# Patient Record
Sex: Male | Born: 1965
Health system: Southern US, Community
[De-identification: ages and names within clinical notes are randomized; demographics above are authoritative.]

## PROBLEM LIST (undated history)

## (undated) DIAGNOSIS — A419 Sepsis, unspecified organism: Secondary | ICD-10-CM

## (undated) DIAGNOSIS — K649 Unspecified hemorrhoids: Secondary | ICD-10-CM

## (undated) DIAGNOSIS — L270 Generalized skin eruption due to drugs and medicaments taken internally: Secondary | ICD-10-CM

## (undated) DIAGNOSIS — L03115 Cellulitis of right lower limb: Secondary | ICD-10-CM

## (undated) DIAGNOSIS — L52 Erythema nodosum: Secondary | ICD-10-CM

## (undated) DIAGNOSIS — K602 Anal fissure, unspecified: Secondary | ICD-10-CM

## (undated) DIAGNOSIS — D573 Sickle-cell trait: Secondary | ICD-10-CM

## (undated) DIAGNOSIS — R609 Edema, unspecified: Secondary | ICD-10-CM

## (undated) DIAGNOSIS — C44509 Unspecified malignant neoplasm of skin of other part of trunk: Secondary | ICD-10-CM

## (undated) DIAGNOSIS — Z86010 Personal history of colonic polyps: Secondary | ICD-10-CM

## (undated) DIAGNOSIS — L0390 Cellulitis, unspecified: Secondary | ICD-10-CM

## (undated) DIAGNOSIS — L959 Vasculitis limited to the skin, unspecified: Secondary | ICD-10-CM

## (undated) HISTORY — DX: Anal fissure, unspecified: K60.2

## (undated) HISTORY — DX: Generalized skin eruption due to drugs and medicaments taken internally: L27.0

## (undated) HISTORY — PX: HERNIA REPAIR: SHX51

## (undated) HISTORY — DX: Erythema nodosum: L52

## (undated) HISTORY — DX: Unspecified hemorrhoids: K64.9

## (undated) HISTORY — DX: Cellulitis, unspecified: L03.90

## (undated) HISTORY — PX: PENIS REVASCULARIZATION SURGERY: SHX740

## (undated) HISTORY — DX: Edema, unspecified: R60.9

## (undated) HISTORY — DX: Sickle-cell trait: D57.3

## (undated) HISTORY — DX: Vasculitis limited to the skin, unspecified: L95.9

---
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
## 2007-12-24 ENCOUNTER — Ambulatory Visit (HOSPITAL_BASED_OUTPATIENT_CLINIC_OR_DEPARTMENT_OTHER): Admission: RE | Admit: 2007-12-24 | Discharge: 2007-12-24 | Payer: Self-pay | Admitting: General Surgery

## 2007-12-24 HISTORY — PX: UMBILICAL HERNIA REPAIR: SHX196

## 2010-05-21 NOTE — Op Note (Signed)
[name], [name]                ACCOUNT NO.:  [id number]   MEDICAL RECORD NO.:  [id number]          PATIENT TYPE:  AMB   LOCATION:  DSC                          FACILITY:  [facility]   PHYSICIAN:  Angelia Mould. Derrell Lolling, M.D.DATE OF BIRTH:  07-05-1965   DATE OF PROCEDURE:  12/24/2007  DATE OF DISCHARGE:                               OPERATIVE REPORT   PREOPERATIVE DIAGNOSIS:  Umbilical hernia.   POSTOPERATIVE DIAGNOSIS:  Umbilical hernia.   OPERATION PERFORMED:  Repair of umbilical hernia with Proceed ventral  patch mesh.   SURGEON:  Angelia Mould. Derrell Lolling, MD   OPERATIVE INDICATIONS:  This is a 45 year old white man who has a 2-year  history of an umbilical hernia.  This has gotten larger and has become  more unsightly and is associated with some pain and progressive  enlargement.  He was evaluated in the office and found to have a  reducible umbilical hernia with moderate-size hernia sac about 3.5 cm in  size.  He is brought to the operating room electively.   OPERATIVE TECHNIQUE:  Following induction of a general LMA anesthetic,  the patient's abdomen was prepped and draped in a sterile fashion.  The  patient was identified as correct patient and correct procedure.  Intravenous antibiotics were given.  Marcaine 0.5% with epinephrine was  used as a local infiltration anesthetic.  A curved transverse incision  was made at the lower rim of the umbilicus.  Dissection was carried down  through the subcutaneous tissue down to the intra-abdominal wall fascia.  I dissected circumferentially around the umbilical hernia defect and  incised the tissues and dissected the hernia sac away from the  undersurface of the umbilical skin.  After dissecting this a little bit  further I found that I could easily reduce this back into the  preperitoneal space.  The defect was probably about 2 cm.  I chose to  repair this with a 4.3 cm diameter Proceed ventral patch.  This patch  was brought to the operative  field.  I placed 4 equidistant sutures of  the #1 Novafil to secure the mesh under the fascia.  These sutures were  placed at the 12 o'clock, 3 o'clock, 6 o'clock, and 9 o'clock positions.  We passed the suture through the fascia, then a mattress suture into the  edge of the mesh, and then back up through the fascia under direct  vision.  After all 4 sutures were placed, I inserted the mesh into the  preperitoneal space and pulled all the sutures tight and this provided  very good deployment of the mesh disk.  All the sutures were tied.  The  umbilical fascia was then closed with 3 interrupted sutures of #1  Novafil.  The wound was irrigated with saline.  The repair appeared  intact.  The umbilical skin was tacked back to the abdominal fascia with  a 3-0 Vicryl suture.  The subcutaneous tissue was closed with  interrupted sutures of 3-0 Vicryl.  The skin was closed with a running  subcuticular suture of 4-0 Monocryl and Dermabond.  Clean bandages were  placed, and the patient was taken recovery room in stable condition.   ESTIMATED BLOOD LOSS:  About 10 mL.   COMPLICATIONS:  None.   Sponge, needle, and instrument counts were correct.      Angelia Mould. Derrell Lolling, M.D.  Electronically Signed     HMI/MEDQ  D:  12/24/2007  T:  12/24/2007  Job:  [id number]   cc:   Gaspar Garbe, M.D.

## 2010-11-12 ENCOUNTER — Emergency Department (INDEPENDENT_AMBULATORY_CARE_PROVIDER_SITE_OTHER): Payer: Managed Care, Other (non HMO)

## 2010-11-12 ENCOUNTER — Other Ambulatory Visit: Payer: Self-pay

## 2010-11-12 ENCOUNTER — Emergency Department (HOSPITAL_BASED_OUTPATIENT_CLINIC_OR_DEPARTMENT_OTHER)
Admission: EM | Admit: 2010-11-12 | Discharge: 2010-11-12 | Disposition: A | Discharge status: Home / Self Care | Payer: Managed Care, Other (non HMO) | Attending: Emergency Medicine | Admitting: Emergency Medicine

## 2010-11-12 DIAGNOSIS — K5900 Constipation, unspecified: Secondary | ICD-10-CM | POA: Insufficient documentation

## 2010-11-12 DIAGNOSIS — R509 Fever, unspecified: Secondary | ICD-10-CM

## 2010-11-12 DIAGNOSIS — K824 Cholesterolosis of gallbladder: Secondary | ICD-10-CM

## 2010-11-12 DIAGNOSIS — K279 Peptic ulcer, site unspecified, unspecified as acute or chronic, without hemorrhage or perforation: Secondary | ICD-10-CM | POA: Insufficient documentation

## 2010-11-12 DIAGNOSIS — R109 Unspecified abdominal pain: Secondary | ICD-10-CM | POA: Insufficient documentation

## 2010-11-12 DIAGNOSIS — R1013 Epigastric pain: Secondary | ICD-10-CM

## 2010-11-12 LAB — COMPREHENSIVE METABOLIC PANEL
ALT: 21 U/L (ref 0–53)
Albumin: 4.3 g/dL (ref 3.5–5.2)
Alkaline Phosphatase: 67 U/L (ref 39–117)
BUN: 10 mg/dL (ref 6–23)
Chloride: 102 mEq/L (ref 96–112)
Glucose, Bld: 128 mg/dL — ABNORMAL HIGH (ref 70–99)
Potassium: 4.3 mEq/L (ref 3.5–5.1)
Total Bilirubin: 0.6 mg/dL (ref 0.3–1.2)

## 2010-11-12 LAB — URINALYSIS, ROUTINE W REFLEX MICROSCOPIC
Bilirubin Urine: NEGATIVE
Glucose, UA: NEGATIVE mg/dL
Hgb urine dipstick: NEGATIVE
Ketones, ur: NEGATIVE mg/dL
Protein, ur: NEGATIVE mg/dL
Urobilinogen, UA: 0.2 mg/dL (ref 0.0–1.0)

## 2010-11-12 LAB — CBC
HCT: 42 % (ref 39.0–52.0)
MCH: 27.9 pg (ref 26.0–34.0)
MCHC: 34.8 g/dL (ref 30.0–36.0)
MCV: 80.3 fL (ref 78.0–100.0)
Platelets: 205 10*3/uL (ref 150–400)
RDW: 13.3 % (ref 11.5–15.5)
WBC: 11.9 10*3/uL — ABNORMAL HIGH (ref 4.0–10.5)

## 2010-11-12 LAB — LIPASE, BLOOD: Lipase: 24 U/L (ref 11–59)

## 2010-11-12 LAB — DIFFERENTIAL
Basophils Relative: 0 % (ref 0–1)
Lymphocytes Relative: 8 % — ABNORMAL LOW (ref 12–46)
Lymphs Abs: 0.9 10*3/uL (ref 0.7–4.0)
Monocytes Relative: 9 % (ref 3–12)

## 2010-11-12 MED ORDER — PANTOPRAZOLE SODIUM 20 MG PO TBEC: 40.0000 mg | DELAYED_RELEASE_TABLET | Freq: Every day | ORAL | Status: DC

## 2010-11-12 MED ORDER — KETOROLAC TROMETHAMINE 30 MG/ML IJ SOLN
30.0000 mg | Freq: Once | INTRAMUSCULAR | Status: AC
Start: 2010-11-12 — End: 2010-11-12
  Administered 2010-11-12: 30 mg via INTRAVENOUS

## 2010-11-12 MED ORDER — KETOROLAC TROMETHAMINE 30 MG/ML IJ SOLN
INTRAMUSCULAR | Status: AC
  Administered 2010-11-12: 30 mg
  Filled 2010-11-12: qty 1

## 2010-11-12 MED ORDER — IOHEXOL 300 MG/ML  SOLN: 100.0000 mL | Freq: Once | INTRAMUSCULAR | Status: DC | PRN

## 2010-11-12 NOTE — ED Notes (Signed)
Pt reports abdominal pain that started Sunday.  Pain is described as intermittent and a dull ache.

## 2010-11-12 NOTE — ED Notes (Signed)
Pt returned from ultrasound.  Pharmacy Tech at bedside for medication reconciliation.

## 2010-11-12 NOTE — ED Notes (Signed)
Pt ambulatory to ultrasound.

## 2010-11-12 NOTE — ED Notes (Signed)
Pt reports epigastric pain described as "discomfort" and asking for pain medication.  Dr. Ignacia Palma informed.

## 2010-11-12 NOTE — ED Notes (Signed)
Pt returned from CT °

## 2010-11-12 NOTE — ED Notes (Signed)
Pt transported to CT ?

## 2010-11-12 NOTE — ED Provider Notes (Signed)
History     CSN: [id number] Arrival date & time: 11/12/2010  8:28 AM   None     Chief Complaint  Patient presents with  . Abdominal Pain    (Consider location/radiation/quality/duration/timing/severity/associated sxs/prior treatment) HPI Comments: Patient is a 45 year old man who says he has had a dull ache in his upper abdomen intermittently since Sunday evening, 2 days ago. He says he doesn't usually get indigestion or heartburn. Others he rates the same food at [employer] did not get sick. He had eaten some food yesterday when he had pain in the pain seemed to subside briefly but returned yesterday evening. He therefore sought evaluation. He has had prior surgery for umbilical hernia, for fracture of his penis.  He notes that he works for [employer], where he is exposed to lead.  He requests testing of his lead level.    Patient is a 45 y.o. male presenting with abdominal pain. The history is provided by the patient.  Abdominal Pain The primary symptoms of the illness include abdominal pain. The primary symptoms of the illness do not include nausea, vomiting, diarrhea or dysuria. The current episode started 2 days ago. The onset of the illness was gradual. The problem has not changed since onset. The patient has had a change in bowel habit (He has noted some hard bowel movements lately.). Additional symptoms associated with the illness include constipation. Symptoms associated with the illness do not include chills, anorexia, heartburn, urgency, hematuria, frequency or back pain.    History reviewed. No pertinent past medical history.  Past Surgical History  Procedure Date  . Penis revascularization surgery     History reviewed. No pertinent family history.  History  Substance Use Topics  . Smoking status: Never Smoker   . Smokeless tobacco: Never Used  . Alcohol Use: Yes     occasional      Review of Systems  Constitutional: Negative.  Negative for chills and appetite  change.  HENT: Negative.   Eyes: Negative.   Respiratory: Negative.   Cardiovascular: Negative.  Negative for chest pain.  Gastrointestinal: Positive for abdominal pain and constipation. Negative for heartburn, nausea, vomiting, diarrhea and anorexia.  Genitourinary: Negative for dysuria, urgency, frequency and hematuria.  Musculoskeletal: Negative for back pain.  Skin: Negative.   Neurological: Negative.  Negative for dizziness, syncope and weakness.  Psychiatric/Behavioral: Negative.     Allergies  Review of patient's allergies indicates no known allergies.  Home Medications   Current Outpatient Rx  Name Route Sig Dispense Refill  . ASPIRIN 325 MG PO TABS Oral Take 325 mg by mouth daily. For pain stomach     . IBUPROFEN 200 MG PO TABS Oral Take 600 mg by mouth every 6 (six) hours as needed. Soreness and pain     . PANTOPRAZOLE SODIUM 20 MG PO TBEC Oral Take 2 tablets (40 mg total) by mouth daily. 30 tablet 0    BP 138/85  Pulse 58  Temp(Src) 99.1 F (37.3 C) (Oral)  Resp 16  Ht 6' (1.829 m)  Wt 239 lb (108.41 kg)  BMI 32.41 kg/m2  SpO2 96%  Physical Exam  Nursing note and vitals reviewed. Constitutional: He is oriented to person, place, and time. He appears well-developed and well-nourished. No distress.  HENT:  Head: Normocephalic and atraumatic.  Right Ear: External ear normal.  Left Ear: External ear normal.  Mouth/Throat: Oropharynx is clear and moist.  Eyes: Conjunctivae and EOM are normal. Pupils are equal, round, and reactive to light.  No scleral icterus.  Neck: Normal range of motion. Neck supple. No thyromegaly present.  Cardiovascular: Normal rate, regular rhythm and normal heart sounds.   Pulmonary/Chest: Effort normal and breath sounds normal.  Abdominal: Soft. Bowel sounds are normal.       He localizes his pain to the epigastric region. There is no palpable deformity. There is no point tenderness, mass, rebound or rigidity.  Musculoskeletal: Normal  range of motion.  Lymphadenopathy:    He has no cervical adenopathy.  Neurological: He is alert and oriented to person, place, and time.       No sensory or motor deficits.  Skin: Skin is warm and dry.  Psychiatric: He has a normal mood and affect. His behavior is normal.    ED Course  Procedures (including critical care time)  Labs Reviewed  CBC - Abnormal; Notable for the following:    WBC 11.9 (*)    All other components within normal limits  DIFFERENTIAL - Abnormal; Notable for the following:    Neutrophils Relative 83 (*)    Neutro Abs 9.8 (*)    Lymphocytes Relative 8 (*)    All other components within normal limits  COMPREHENSIVE METABOLIC PANEL - Abnormal; Notable for the following:    Glucose, Bld 128 (*)    All other components within normal limits  URINALYSIS, ROUTINE W REFLEX MICROSCOPIC  LIPASE, BLOOD  TROPONIN I  LEAD, BLOOD   12:30 PM Patient was seen and had physical examination. Laboratory tests and abdominal ultrasound were ordered. A lead level was ordered, and I advised the patient that this would be a test that  would take several days to have a result.   Date: 11/12/2010  Rate:70  Rhythm: normal sinus rhythm  QRS Axis: normal  Intervals: normal  ST/T Wave abnormalities: normal  Conduction Disutrbances:none  Narrative Interpretation: Normal EKG  Old EKG Reviewed: none available  12:30 PM Patient complains of pain. He is driving, so Toradol 30 IV was ordered.  12:30 PM Patient's white blood count was 11,800 with 83% neutrophils, mildly elevated. His ultrasound of the abdomen showed no gallstones. CT of the abdomen and pelvis with oral and IV contrast was ordered. He is currently resting comfortably.  12:30 PM CT of abdomen/pelvis shows thickening of gastric antrum, no perforation.  Will treat for peptic ulcer disease, with Protonix and antacids pc and hs.  He should have followup with a gastroenterologist.  Referred to Dr. Stan Head of [facility].   1. Peptic ulcer disease           Carleene Cooper III, MD 11/12/10 1230

## 2010-12-03 ENCOUNTER — Encounter: Payer: Self-pay | Admitting: Internal Medicine

## 2010-12-23 ENCOUNTER — Encounter: Payer: Self-pay | Admitting: Internal Medicine

## 2010-12-23 ENCOUNTER — Ambulatory Visit (INDEPENDENT_AMBULATORY_CARE_PROVIDER_SITE_OTHER): Payer: Managed Care, Other (non HMO) | Admitting: Internal Medicine

## 2010-12-23 VITALS — BP 102/72 | HR 66 | Ht 72.0 in | Wt 237.0 lb

## 2010-12-23 DIAGNOSIS — R933 Abnormal findings on diagnostic imaging of other parts of digestive tract: Secondary | ICD-10-CM

## 2010-12-23 DIAGNOSIS — R1013 Epigastric pain: Secondary | ICD-10-CM

## 2010-12-23 NOTE — Progress Notes (Signed)
  Subjective:    Patient ID: Benjamin Mcdowell, male    DOB: [birth year], 45 y.o.   MRN: [id number]  HPI this 45 year old white man developed epigastric pain about 6 weeks ago. It was fairly intense. He went to the emergency department where he was evaluated with an ultrasound and a CT scan, I reviewed those reports. The main finding on those was that he had some thickening of the gastric antrum of unclear etiology. He took Protonix 40 mg daily for one month. He now feels well with the exception of some occasional hunger pains. He does not use anti-inflammatories on a regular basis though uses some Advil from time to time. He was drinking heavily on the weekends during [employer] football season. He uses a lot of diet caffeinated sodas as well. He's had no unintentional weight loss or bleeding. His CBC and chemistries were normal. He did request a lead level which was in the high normal range for adult, he works at [employer] he is taking steps to reduce his levels and is having followup at work.  His GI review of systems is otherwise negative.   Review of Systems All other review of systems negative except as mentioned above.    Objective:   Physical Exam General:  Well-developed, well-nourished and in no acute distress - overweight Eyes:  anicteric. ENT:   Mouth and posterior pharynx free of lesions.  Neck:   supple w/o thyromegaly or mass.  Lungs: Clear to auscultation bilaterally. Heart:  S1S2, no rubs, murmurs, gallops. Abdomen:  soft, non-tender, no hepatosplenomegaly, hernia, or mass and BS+.  Lymph:  no cervical or supraclavicular adenopathy. Neuro:  A&O x 3.  Psych:  appropriate mood and  Affect.   Data Reviewed: Labs, CT scan report, ultrasound report, ER note from November 6.        Assessment & Plan:  Epigastric pain, thickening of the gastric antrum on CT the abdomen. His symptoms have essentially resolved after one month of PPI.  Cause of all this not clear. I think  GERD, gastritis, peptic ulcer disease and even upper GI tract malignancy are possible. We have discussed these possibilities. He understands that without upper endoscopy testing we cannot have a better understanding of the cause of the thickened antrum on CT. He really had not been sick for very long and he feels well now. So the overall likelihood of a gastrointestinal malignancy is low it seems. However, again I made clear that stomach cancer is a possibility here.  We discussed the options of upper endoscopy versus observation and some diet modification. Since he has no worrisome features other than a radiographic abnormality, he has decided to opt for the observation mode in follow up as needed. He is advised to lose weight as well as obtain a primary care physician.

## 2010-12-23 NOTE — Patient Instructions (Signed)
We really do not know what the thickening of your stomach wall on the CT scan was. As discussed it is probably not anything bad but it could be. You have chosen not to pursue upper endoscopy for evaluation. If you develop recurrent abdominal pain or similar symptoms please call back and we can arrange further followup or upper endoscopy testing. You have been given some GERD diet instructions that may be useful in minimizing symptoms, if he get them. Losing 10 pounds in the next year would be helpful to you overall health. I recommend to obtain a primary care physician for continuity of care as well. Iva Boop, MD, Clement[facility] Graham

## 2013-01-04 ENCOUNTER — Emergency Department (HOSPITAL_BASED_OUTPATIENT_CLINIC_OR_DEPARTMENT_OTHER)
Admission: EM | Admit: 2013-01-04 | Discharge: 2013-01-04 | Disposition: A | Discharge status: Home / Self Care | Payer: Managed Care, Other (non HMO) | Attending: Emergency Medicine | Admitting: Emergency Medicine

## 2013-01-04 ENCOUNTER — Encounter (HOSPITAL_BASED_OUTPATIENT_CLINIC_OR_DEPARTMENT_OTHER): Payer: Self-pay | Admitting: Emergency Medicine

## 2013-01-04 DIAGNOSIS — L02619 Cutaneous abscess of unspecified foot: Secondary | ICD-10-CM | POA: Insufficient documentation

## 2013-01-04 DIAGNOSIS — Z792 Long term (current) use of antibiotics: Secondary | ICD-10-CM | POA: Insufficient documentation

## 2013-01-04 DIAGNOSIS — Z8719 Personal history of other diseases of the digestive system: Secondary | ICD-10-CM | POA: Insufficient documentation

## 2013-01-04 DIAGNOSIS — R509 Fever, unspecified: Secondary | ICD-10-CM | POA: Insufficient documentation

## 2013-01-04 DIAGNOSIS — L03119 Cellulitis of unspecified part of limb: Secondary | ICD-10-CM

## 2013-01-04 LAB — BASIC METABOLIC PANEL
BUN: 13 mg/dL (ref 6–23)
CO2: 28 mEq/L (ref 19–32)
Chloride: 100 mEq/L (ref 96–112)
Creatinine, Ser: 1.1 mg/dL (ref 0.50–1.35)
Glucose, Bld: 96 mg/dL (ref 70–99)

## 2013-01-04 LAB — CBC
HCT: 41.3 % (ref 39.0–52.0)
Hemoglobin: 14.2 g/dL (ref 13.0–17.0)
MCV: 83.4 fL (ref 78.0–100.0)
RDW: 13.3 % (ref 11.5–15.5)
WBC: 12.7 10*3/uL — ABNORMAL HIGH (ref 4.0–10.5)

## 2013-01-04 MED ORDER — CLINDAMYCIN HCL 150 MG PO CAPS: 300.0000 mg | ORAL_CAPSULE | Freq: Three times a day (TID) | ORAL | Status: DC

## 2013-01-04 MED ORDER — CLINDAMYCIN PHOSPHATE 600 MG/50ML IV SOLN
600.0000 mg | Freq: Once | INTRAVENOUS | Status: AC
Start: 2013-01-04 — End: 2013-01-04
  Administered 2013-01-04: 600 mg via INTRAVENOUS
  Filled 2013-01-04: qty 50

## 2013-01-04 NOTE — ED Notes (Signed)
Swollen, reddness in right ankle, foot. Cellulitis in that foot before

## 2013-01-04 NOTE — ED Provider Notes (Signed)
CSN: [id number]     Arrival date & time 01/04/13  1441 History   First MD Initiated Contact with Patient 01/04/13 1507     Chief Complaint  Patient presents with  . Foot Pain   (Consider location/radiation/quality/duration/timing/severity/associated sxs/prior Treatment) HPI Pt presenting with c/o pain in right foot.  Associated with redness and swelling.  Pt has had hx of prior cellulitis of this foot, last episode was several months ago after insect bite.  No known trauma or skin breakdown of foot this time.  Had subjective fever, diffuse body aches yesterday, associated with beginning of redness of foot.  Today redness is increasing to involve his lower leg.  Swelling of right lower extremity and foot compared to left side is chronic since age 4.  There are no other associated systemic symptoms, there are no other alleviating or modifying factors.   Past Medical History  Diagnosis Date  . Anal fissure    Past Surgical History  Procedure Laterality Date  . Penis revascularization surgery    . Umbilical hernia repair  12/24/2007    with Proceed ventral   Family History  Problem Relation Age of Onset  . Prostate cancer Father   . Heart disease      GRANDFATHER   History  Substance Use Topics  . Smoking status: Never Smoker   . Smokeless tobacco: Never Used  . Alcohol Use: Yes     Comment: occasional    Review of Systems ROS reviewed and all otherwise negative except for mentioned in HPI  Allergies  Review of patient's allergies indicates no known allergies.  Home Medications   Current Outpatient Rx  Name  Route  Sig  Dispense  Refill  . clindamycin (CLEOCIN) 150 MG capsule   Oral   Take 2 capsules (300 mg total) by mouth 3 (three) times daily.   30 capsule   0   . ibuprofen (ADVIL,MOTRIN) 200 MG tablet   Oral   Take 600 mg by mouth every 6 (six) hours as needed. Soreness and pain           BP 128/88  Pulse 103  Temp(Src) 98.3 F (36.8 C) (Oral)  Resp 18   Ht 6' (1.829 m)  Wt 255 lb (115.667 kg)  BMI 34.58 kg/m2  SpO2 99% Vitals reviewed Physical Exam Physical Examination: General appearance - alert, well appearing, and in no distress Mental status - alert, oriented to person, place, and time Mouth - mucous membranes moist, pharynx normal without lesions Chest - clear to auscultation, no wheezes, rales or rhonchi, symmetric air entry Heart - normal rate, regular rhythm, normal S1, S2, no murmurs, rubs, clicks or gallops Neurological - alert, oriented x 3, sensation distally intact Musculoskeletal - no joint tenderness, deformity or swelling Extremities - peripheral pulses normal, no pedal edema, no clubbing or cyanosis Skin - skin overlying dorsum of right foot erythematous extending into anterior tibial region, no ulcerations, no blisters  ED Course  Procedures (including critical care time) Labs Review Labs Reviewed  CBC - Abnormal; Notable for the following:    WBC 12.7 (*)    All other components within normal limits  BASIC METABOLIC PANEL - Abnormal; Notable for the following:    GFR calc non Af Amer 78 (*)    All other components within normal limits   Imaging Review No results found.  EKG Interpretation   None       MDM   1. Cellulitis of foot    Pt  presenting with c/o redness in right foot, chronic swelling, subjective fever- has hx of cellulitis in this foot.  Mild leukocytosis, started on IV clindamycin- has had doxycycline in the past but this did not help his symptoms during a previous episode.  Discharged with strict return precautions.  Pt agreeable with plan.    Ethelda Chick, MD 01/04/13 [phone]

## 2013-01-05 ENCOUNTER — Inpatient Hospital Stay (HOSPITAL_BASED_OUTPATIENT_CLINIC_OR_DEPARTMENT_OTHER)
Admission: EM | Admit: 2013-01-05 | Discharge: 2013-01-07 | DRG: 603 | Disposition: A | Discharge status: Home / Self Care | Payer: Managed Care, Other (non HMO) | Attending: Internal Medicine | Admitting: Internal Medicine

## 2013-01-05 ENCOUNTER — Encounter (HOSPITAL_BASED_OUTPATIENT_CLINIC_OR_DEPARTMENT_OTHER): Payer: Self-pay | Admitting: Emergency Medicine

## 2013-01-05 ENCOUNTER — Emergency Department (HOSPITAL_BASED_OUTPATIENT_CLINIC_OR_DEPARTMENT_OTHER): Payer: Managed Care, Other (non HMO)

## 2013-01-05 DIAGNOSIS — I890 Lymphedema, not elsewhere classified: Secondary | ICD-10-CM | POA: Diagnosis present

## 2013-01-05 DIAGNOSIS — E781 Pure hyperglyceridemia: Secondary | ICD-10-CM | POA: Diagnosis present

## 2013-01-05 DIAGNOSIS — L02619 Cutaneous abscess of unspecified foot: Principal | ICD-10-CM | POA: Diagnosis present

## 2013-01-05 DIAGNOSIS — D692 Other nonthrombocytopenic purpura: Secondary | ICD-10-CM | POA: Diagnosis present

## 2013-01-05 DIAGNOSIS — Z8042 Family history of malignant neoplasm of prostate: Secondary | ICD-10-CM

## 2013-01-05 DIAGNOSIS — L03115 Cellulitis of right lower limb: Secondary | ICD-10-CM

## 2013-01-05 LAB — CBC WITH DIFFERENTIAL/PLATELET
Basophils Absolute: 0 10*3/uL (ref 0.0–0.1)
Basophils Relative: 0 % (ref 0–1)
Eosinophils Relative: 2 % (ref 0–5)
HCT: 39.5 % (ref 39.0–52.0)
Hemoglobin: 13.7 g/dL (ref 13.0–17.0)
MCH: 28.8 pg (ref 26.0–34.0)
MCHC: 34.7 g/dL (ref 30.0–36.0)
MCV: 83.2 fL (ref 78.0–100.0)
Monocytes Absolute: 0.9 10*3/uL (ref 0.1–1.0)
Monocytes Relative: 10 % (ref 3–12)
RDW: 13.1 % (ref 11.5–15.5)

## 2013-01-05 LAB — COMPREHENSIVE METABOLIC PANEL
AST: 30 U/L (ref 0–37)
Albumin: 3.7 g/dL (ref 3.5–5.2)
BUN: 13 mg/dL (ref 6–23)
Calcium: 9.1 mg/dL (ref 8.4–10.5)
Creatinine, Ser: 1.1 mg/dL (ref 0.50–1.35)
GFR calc non Af Amer: 78 mL/min — ABNORMAL LOW (ref >90)
Total Bilirubin: 0.7 mg/dL (ref 0.3–1.2)

## 2013-01-05 MED ORDER — CEFTRIAXONE SODIUM 1 G IJ SOLR
INTRAMUSCULAR | Status: AC
  Administered 2013-01-05: 1000 mg
  Filled 2013-01-05: qty 10

## 2013-01-05 MED ORDER — DEXTROSE 5 % IV SOLN
1.0000 g | INTRAVENOUS | Status: DC
  Administered 2013-01-06: 1 g via INTRAVENOUS
  Filled 2013-01-05 (×2): qty 10

## 2013-01-05 NOTE — ED Notes (Signed)
Pt returned from radiology.

## 2013-01-05 NOTE — ED Provider Notes (Signed)
History/physical exam/procedure(s) were performed by non-physician practitioner and as supervising physician I was immediately available for consultation/collaboration. I have reviewed all notes and am in agreement with care and plan.   Hilario Quarry, MD 01/05/13 [phone]

## 2013-01-05 NOTE — ED Provider Notes (Signed)
CSN: [id number]     Arrival date & time 01/05/13  1518 History   First MD Initiated Contact with Patient 01/05/13 1555     Chief Complaint  Patient presents with  . Wound Check   (Consider location/radiation/quality/duration/timing/severity/associated sxs/prior Treatment) Patient is a 47 y.o. male presenting with foot injury. The history is provided by the patient. No language interpreter was used.  Foot Injury Location:  Foot Time since incident:  3 days Foot location:  R foot Pain details:    Quality:  Aching and burning   Radiates to:  Does not radiate   Severity:  Moderate   Onset quality:  Gradual   Timing:  Constant   Progression:  Worsening Chronicity:  New Dislocation: no   Foreign body present:  No foreign bodies Tetanus status:  Up to date Relieved by:  Nothing Worsened by:  Nothing tried Ineffective treatments:  None tried Associated symptoms: stiffness   Risk factors: no concern for non-accidental trauma     Past Medical History  Diagnosis Date  . Anal fissure    Past Surgical History  Procedure Laterality Date  . Penis revascularization surgery    . Umbilical hernia repair  12/24/2007    with Proceed ventral   Family History  Problem Relation Age of Onset  . Prostate cancer Father   . Heart disease      GRANDFATHER   History  Substance Use Topics  . Smoking status: Never Smoker   . Smokeless tobacco: Never Used  . Alcohol Use: Yes     Comment: occasional    Review of Systems  Musculoskeletal: Positive for joint swelling and stiffness.  Skin: Positive for color change.  All other systems reviewed and are negative.    Allergies  Review of patient's allergies indicates no known allergies.  Home Medications   Current Outpatient Rx  Name  Route  Sig  Dispense  Refill  . clindamycin (CLEOCIN) 150 MG capsule   Oral   Take 2 capsules (300 mg total) by mouth 3 (three) times daily.   30 capsule   0   . ibuprofen (ADVIL,MOTRIN) 200 MG  tablet   Oral   Take 600 mg by mouth every 6 (six) hours as needed. Soreness and pain           BP 132/91  Pulse 87  Temp(Src) 98.1 F (36.7 C) (Oral)  Resp 16  SpO2 98% Physical Exam  Nursing note and vitals reviewed. Constitutional: He is oriented to person, place, and time. He appears well-developed and well-nourished.  HENT:  Head: Normocephalic.  Eyes: Pupils are equal, round, and reactive to light.  Neck: Normal range of motion.  Cardiovascular: Normal rate.   Pulmonary/Chest: Effort normal.  Abdominal: Soft.  Musculoskeletal: He exhibits tenderness.  Neurological: He is alert and oriented to person, place, and time. He has normal reflexes.  Skin: There is erythema.  Psychiatric: He has a normal mood and affect.    ED Course  Procedures (including critical care time) Labs Review Labs Reviewed  COMPREHENSIVE METABOLIC PANEL - Abnormal; Notable for the following:    Glucose, Bld 115 (*)    ALT 66 (*)    GFR calc non Af Amer 78 (*)    All other components within normal limits  CBC WITH DIFFERENTIAL   Imaging Review No results found.  EKG Interpretation   None     Pt was seen here yesterday and started on clindamycin.   Pt reports he has had cellulitis  in the same foot in the past.  Pt reports he has responded well to rocephin with previous infections.     MDM   1. Cellulitis of right foot     I discussed with Dr. Allena Katz.   Pt to be admitted to med surg.  Pt refuses to go by [facility].   Pt request to drive himself.    Dr. Rosalia Hammers spoke to hospitalist and pt can not be admitted to floor if he drives.   Pt very upset.  I spoke to Dr. Patria Mane who will see at [facility] ED and contact hospitalist for admission.   Elson Areas, PA-C 01/05/13 1918  Lonia Skinner [place], [place] 01/05/13 2236

## 2013-01-05 NOTE — ED Notes (Signed)
Pts transferred to the [facility] via POV.  pts IV secured for transport.  Mayra Reel, RN at [facility] is aware that pt is coming.

## 2013-01-05 NOTE — ED Notes (Signed)
Patient transported to X-ray 

## 2013-01-05 NOTE — ED Notes (Signed)
Margins marked on right lower extremity.

## 2013-01-05 NOTE — ED Notes (Signed)
Pt seen here yesterday back today for wd recheck right foot cellulitis

## 2013-01-06 DIAGNOSIS — L03119 Cellulitis of unspecified part of limb: Principal | ICD-10-CM

## 2013-01-06 DIAGNOSIS — E781 Pure hyperglyceridemia: Secondary | ICD-10-CM | POA: Diagnosis present

## 2013-01-06 DIAGNOSIS — M79609 Pain in unspecified limb: Secondary | ICD-10-CM

## 2013-01-06 DIAGNOSIS — L02619 Cutaneous abscess of unspecified foot: Principal | ICD-10-CM

## 2013-01-06 DIAGNOSIS — I890 Lymphedema, not elsewhere classified: Secondary | ICD-10-CM | POA: Diagnosis present

## 2013-01-06 LAB — CBC
HEMATOCRIT: 37.1 % — AB (ref 39.0–52.0)
Hemoglobin: 8 g/dL — ABNORMAL LOW (ref 13.0–17.0)
MCH: 18.3 pg — ABNORMAL LOW (ref 26.0–34.0)
MCHC: 21.6 g/dL — AB (ref 30.0–36.0)
MCV: 84.7 fL (ref 78.0–100.0)
PLATELETS: 163 10*3/uL (ref 150–400)
RBC: 4.38 MIL/uL (ref 4.22–5.81)
RDW: 13.2 % (ref 11.5–15.5)
WBC: 7.3 10*3/uL (ref 4.0–10.5)

## 2013-01-06 LAB — COMPREHENSIVE METABOLIC PANEL
ALK PHOS: 57 U/L (ref 39–117)
ALT: 65 U/L — AB (ref 0–53)
AST: 35 U/L (ref 0–37)
Albumin: 3.3 g/dL — ABNORMAL LOW (ref 3.5–5.2)
BILIRUBIN TOTAL: 0.4 mg/dL (ref 0.3–1.2)
BUN: 12 mg/dL (ref 6–23)
CO2: 25 mEq/L (ref 19–32)
Calcium: 8.7 mg/dL (ref 8.4–10.5)
Chloride: 103 mEq/L (ref 96–112)
Creatinine, Ser: 0.95 mg/dL (ref 0.50–1.35)
GFR calc non Af Amer: >90 mL/min (ref >90)
GLUCOSE: 130 mg/dL — AB (ref 70–99)
POTASSIUM: 4.1 meq/L (ref 3.7–5.3)
Sodium: 142 mEq/L (ref 137–147)
Total Protein: 6.5 g/dL (ref 6.0–8.3)

## 2013-01-06 LAB — PROTIME-INR
INR: 0.99 (ref 0.00–1.49)
Prothrombin Time: 12.9 seconds (ref 11.6–15.2)

## 2013-01-06 LAB — HEMOGLOBIN AND HEMATOCRIT, BLOOD
HCT: 38.2 % — ABNORMAL LOW (ref 39.0–52.0)
Hemoglobin: 13.3 g/dL (ref 13.0–17.0)

## 2013-01-06 MED ORDER — VANCOMYCIN HCL IN DEXTROSE 1-5 GM/200ML-% IV SOLN
1000.0000 mg | Freq: Three times a day (TID) | INTRAVENOUS | Status: DC
  Administered 2013-01-06 – 2013-01-07 (×5): 1000 mg via INTRAVENOUS
  Filled 2013-01-06 (×7): qty 200

## 2013-01-06 MED ORDER — ACETAMINOPHEN 650 MG RE SUPP: 650.0000 mg | Freq: Four times a day (QID) | RECTAL | Status: DC | PRN

## 2013-01-06 MED ORDER — SODIUM CHLORIDE 0.9 % IV SOLN: INTRAVENOUS | Status: AC

## 2013-01-06 MED ORDER — ENOXAPARIN SODIUM 40 MG/0.4ML ~~LOC~~ SOLN
40.0000 mg | Freq: Every day | SUBCUTANEOUS | Status: DC
  Administered 2013-01-06 – 2013-01-07 (×2): 40 mg via SUBCUTANEOUS
  Filled 2013-01-06 (×2): qty 0.4

## 2013-01-06 MED ORDER — VANCOMYCIN HCL IN DEXTROSE 1-5 GM/200ML-% IV SOLN
1000.0000 mg | Freq: Once | INTRAVENOUS | Status: AC
  Administered 2013-01-06: 1000 mg via INTRAVENOUS

## 2013-01-06 MED ORDER — ACETAMINOPHEN 325 MG PO TABS: 650.0000 mg | ORAL_TABLET | Freq: Four times a day (QID) | ORAL | Status: DC | PRN

## 2013-01-06 NOTE — ED Notes (Signed)
This RN was told that because the pt was transferred from [facility] by POV instead of by ambulance, pt needs to be worked up again. Venora Maples, MD is aware.

## 2013-01-06 NOTE — Progress Notes (Signed)
*  PRELIMINARY RESULTS* Vascular Ultrasound Right lower extremity venous duplex has been completed.  Preliminary findings: no evidence of DVT.    Landry Mellow, RDMS, RVT  01/06/2013, 2:19 PM

## 2013-01-06 NOTE — Progress Notes (Signed)
ANTIBIOTIC CONSULT NOTE - INITIAL  Pharmacy Consult for Vancomycin Indication: cellulitis  No Known Allergies  Patient Measurements: Height: 6' 0.05" (183 cm) Weight: 255 lb 11.7 oz (116 kg) IBW/kg (Calculated) : 77.71 Adjusted Body Weight: 95 kg  Vital Signs: Temp: 98.4 F (36.9 C) (01/01 0147) Temp src: Oral (01/01 0147) BP: 142/89 mmHg (01/01 0147) Pulse Rate: 89 (01/01 0147) Intake/Output from previous day:   Intake/Output from this shift:    Labs:  Recent Labs  01/04/13 1530 01/05/13 1650  WBC 12.7* 8.9  HGB 14.2 13.7  PLT 160 176  CREATININE 1.10 1.10   Estimated Creatinine Clearance: 109.2 ml/min (by C-G formula based on Cr of 1.1). No results found for this basename: VANCOTROUGH, VANCOPEAK, VANCORANDOM, GENTTROUGH, GENTPEAK, GENTRANDOM, TOBRATROUGH, TOBRAPEAK, TOBRARND, AMIKACINPEAK, AMIKACINTROU, AMIKACIN,  in the last 72 hours   Microbiology: No results found for this or any previous visit (from the past 720 hour(s)).  Medical History: Past Medical History  Diagnosis Date  . Anal fissure     Medications:  Clindamycin  Ibuprofen  Assessment: 48 yo male with RLE cellulitis for empiric antibiotics  Goal of Therapy:  Vancomycin trough level 10-15 mcg/ml  Plan:  Vancomycin 1 g IV now, then 1 g IV q8h starting this morning at 0600  Gaetana Kawahara, [place]onson Curb 01/06/2013,1:54 AM

## 2013-01-06 NOTE — ED Provider Notes (Signed)
12:28 AM Pt with worsening RLE cellulitis despite antibiotics as an outpatient.  He now has erythema of his right medial thigh.  Afebrile.  Patient given a dose of Rocephin at the outside emergency department.  Came to this emergency department private vehicle for which I accepted.  I think the patient will benefit from admission to the hospital for ongoing IV antibiotics.  He may also benefit from right lower extremity duplex.  [place], MD 01/06/13 [phone]

## 2013-01-06 NOTE — ED Notes (Signed)
[place], MD at bedside.  

## 2013-01-06 NOTE — Progress Notes (Signed)
[facility] PROGRESS NOTE  Benjamin Mcdowell DJS:[id number] DOB: 1965/11/26 DOA: 01-26-13 PCP: No PCP Per Patient  Assessment/Plan:  Right leg cellulitis with purpura 1st episode when patient was 15.  This is 6th episode.  Received 1 day of clinda outpatient Started on vanc and rocephin at admission. May be able to narrow to vanc. Ordered RLE doppler u/s to rule out DVT.  High triglycerides 270s  Noted on work physical 12/15 Patient needs PCP to follow this outpatient.    DVT Prophylaxis:  lovenox  Code Status: full Family Communication: patient A&O Disposition Plan: inpatient.      Antibiotics:  vanc  HPI/Subjective: Redness and swelling started 3 days ago.  Patient is concern about swelling/redness in his groin.  Afraid cellulitis is seeding to his groin.  6th episode of cellulitis. Never had purpura before.  Objective: Filed Vitals:   01/26/2013 2342 01/06/13 0147 01/06/13 0239 01/06/13 0546  BP: 148/89 142/89 137/89 131/79  Pulse: 95 89 81 82  Temp:  98.4 F (36.9 C) 98.5 F (36.9 C) 98.4 F (36.9 C)  TempSrc:  Oral Oral Oral  Resp: 16  19 20   Height:  6' 0.05" (1.83 m)    Weight:  116 kg (255 lb 11.7 oz)    SpO2: 96% 99% 96% 100%    Intake/Output Summary (Last 24 hours) at 01/06/13 1116 Last data filed at 01/06/13 1017  Gross per 24 hour  Intake    610 ml  Output    500 ml  Net    110 ml   Filed Weights   01/06/13 0147  Weight: 116 kg (255 lb 11.7 oz)    Exam: General: Well developed, well nourished, NAD, appears stated age  48:  PERR, EOMI, Anicteic Sclera, MMM. No pharyngeal erythema or exudates  Neck: Supple, no JVD, no masses  Cardiovascular: RRR, S1 S2 auscultated, no rubs, murmurs or gallops.   Respiratory: Clear to auscultation bilaterally with equal chest rise  Abdomen: Soft, nontender, nondistended, + bowel sounds.   GU:  No lymphadenopathy or swelling noted in right groin. Extremities: right lower extremity with pink swelling  from mid tibia thru foot/toes.  approx 15 raised annular purpura.  Good rom.  Neuro: AAOx3, cranial nerves grossly intact. Strength 5/5 in upper and lower extremities  Psych: Normal affect and demeanor with intact judgement and insight       Data Reviewed: Basic Metabolic Panel:  Recent Labs Lab 01/04/13 1530 Jan 26, 2013 1650 01/06/13 0358  NA 140 142 142  K 4.0 4.1 4.1  CL 100 103 103  CO2 28 27 25   GLUCOSE 96 115* 130*  BUN 13 13 12   CREATININE 1.10 1.10 0.95  CALCIUM 8.9 9.1 8.7   Liver Function Tests:  Recent Labs Lab January 26, 2013 1650 01/06/13 0358  AST 30 35  ALT 66* 65*  ALKPHOS 59 57  BILITOT 0.7 0.4  PROT 7.1 6.5  ALBUMIN 3.7 3.3*   CBC:  Recent Labs Lab 01/04/13 1530 01-26-2013 1650 01/06/13 0358 01/06/13 0910  WBC 12.7* 8.9 7.3  --   NEUTROABS  --  6.2  --   --   HGB 14.2 13.7 8.0* 13.3  HCT 41.3 39.5 37.1* 38.2*  MCV 83.4 83.2 84.7  --   PLT 160 176 163  --      Studies: Dg Foot Complete Right  01-26-13   CLINICAL DATA:  Wound check. Foot pain with swelling and erythema. No injury.  EXAM: RIGHT FOOT COMPLETE - 3+ VIEW  COMPARISON:  None.  FINDINGS: There are mild degenerative changes of the 1st MTP joint. There is no acute fracture or dislocation. There is no evidence of bone destruction to suggest osteomyelitis. There is mild prominence of the dorsal soft tissues of the mid to forefoot.  IMPRESSION: Mild soft tissue swelling over the dorsum of the mid to forefoot. No evidence of osteomyelitis.  Mild degenerative change of the 1st MTP joint.   Electronically Signed   By: Marin Olp M.D.   On: 01/05/2013 18:15    Scheduled Meds: . cefTRIAXone (ROCEPHIN)  IV  1 g Intravenous Q24H  . enoxaparin (LOVENOX) injection  40 mg Subcutaneous Daily  . vancomycin  1,000 mg Intravenous Q8H   Continuous Infusions:   Principal Problem:   Cellulitis Active Problems:   possible Lymphedema   High triglycerides    Karen Kitchens  [facility] Pager [phone]. If 7PM-7AM, please contact night-coverage at [email or web address], password [facility] 01/06/2013, 11:16 AM  LOS: 1 day

## 2013-01-06 NOTE — H&P (Signed)
[facility] History and Physical  Patient: Benjamin Mcdowell  ZOX:[id number]  DOB: 10-May-1965  DOS: the patient was seen and examined on 01/06/2013 PCP: No PCP Per Patient  Chief Complaint: Right foot redness and swelling  HPI: [name] is a 48 y.o. male with Past medical history of Recurrent cellulitis of the right foot and Chronic swelling Of the right foot. The patient is coming from home. The patient presents with complaints of swelling with pain of the right foot that has been ongoing since last to 3 days. He initially noted a swelling on Monday and he went to the ED on Tuesday at which time he was placed on clindamycin 300 mg 3 times a day and was discharged home. Despite taking the medication his swelling continues to worsen and then was also treated with redness and vesicular lesions on the right foot and therefore he came to the ED today again on Wednesday. He was given one dose of IV ceftriaxone At [facility] and was sent to [facility] For further evaluation and then was recommended for admission. He mentions that he has recurrent cellulitis since he was child. He was also told that he has some lymph node damage that's why he has chronic swelling of the right foot.He has recurrent cellulitis with last episode being in June and July at which time he was treated with antibiotics. He also mentions that his symptoms initially started as a pain in his right high 80 a which was later on turned red. And then he started having the redness of the foot. He denies any rash anywhere else. Pt denies any fever, chills, headache, cough, chest pain, palpitation, shortness of breath, orthopnea, PND, nausea, vomiting, abdominal pain, diarrhea, constipation, active bleeding, burning urination, dizziness, focal neurological deficit.  Review of Systems: as mentioned in the history of present illness.  A Comprehensive review of the other systems is negative.  Past Medical History  Diagnosis  Date  . Anal fissure    Past Surgical History  Procedure Laterality Date  . Penis revascularization surgery    . Umbilical hernia repair  12/24/2007    with Proceed ventral   Social History:  reports that he has never smoked. He has never used smokeless tobacco. He reports that he drinks alcohol. He reports that he uses illicit drugs (Marijuana). Independent for most of his  ADL.  No Known Allergies  Family History  Problem Relation Age of Onset  . Prostate cancer Father   . Heart disease      GRANDFATHER    Prior to Admission medications   Medication Sig Start Date End Date Taking? Authorizing Provider  clindamycin (CLEOCIN) 150 MG capsule Take 2 capsules (300 mg total) by mouth 3 (three) times daily. 01/04/13  Yes Threasa Beards, MD  ibuprofen (ADVIL,MOTRIN) 200 MG tablet Take 600 mg by mouth every 6 (six) hours as needed. Soreness and pain    Yes Historical Provider, MD    Physical Exam: Filed Vitals:   01/05/13 1522 01/05/13 1839 01/05/13 2106 01/05/13 2342  BP: 132/91 135/85 146/80 148/89  Pulse: 87 80 89 95  Temp: 98.1 F (36.7 C) 98.5 F (36.9 C) 98.8 F (37.1 C)   TempSrc:  Oral Oral   Resp:  16 18 16   SpO2: 98% 100% 100% 96%    General: Alert, Awake and Oriented to Time, Place and Person. Appear in moderate distress Eyes: PERRL ENT: Oral Mucosa clear moist. Neck: No JVD Cardiovascular: S1 and  S2 Present, No Murmur, Peripheral Pulses Present Respiratory: Bilateral Air entry equal and Decreased, Clear to Auscultation,  No Crackles,No wheezes Abdomen: Bowel Sound Present, Soft and Non tender Skin: No Rash Extremities: Right foot Pedal edemaAssociated with redness and warmth and tenderness, no joint tenderness, also redness on the right medial thigh with warmth and tenderness, No calf tenderness Neurologic: Grossly Unremarkable.  Labs on Admission:  CBC:  Recent Labs Lab 01/04/13 1530 01/05/13 1650  WBC 12.7* 8.9  NEUTROABS  --  6.2  HGB 14.2 13.7   HCT 41.3 39.5  MCV 83.4 83.2  PLT 160 176    CMP     Component Value Date/Time   NA 142 01/05/2013 1650   K 4.1 01/05/2013 1650   CL 103 01/05/2013 1650   CO2 27 01/05/2013 1650   GLUCOSE 115* 01/05/2013 1650   BUN 13 01/05/2013 1650   CREATININE 1.10 01/05/2013 1650   CALCIUM 9.1 01/05/2013 1650   PROT 7.1 01/05/2013 1650   ALBUMIN 3.7 01/05/2013 1650   AST 30 01/05/2013 1650   ALT 66* 01/05/2013 1650   ALKPHOS 59 01/05/2013 1650   BILITOT 0.7 01/05/2013 1650   GFRNONAA 78* 01/05/2013 1650   GFRAA >90 01/05/2013 1650    No results found for this basename: LIPASE, AMYLASE,  in the last 168 hours No results found for this basename: AMMONIA,  in the last 168 hours  No results found for this basename: CKTOTAL, CKMB, CKMBINDEX, TROPONINI,  in the last 168 hours BNP (last 3 results) No results found for this basename: PROBNP,  in the last 8760 hours  Radiological Exams on Admission: Dg Foot Complete Right  01/05/2013   CLINICAL DATA:  Wound check. Foot pain with swelling and erythema. No injury.  EXAM: RIGHT FOOT COMPLETE - 3+ VIEW  COMPARISON:  None.  FINDINGS: There are mild degenerative changes of the 1st MTP joint. There is no acute fracture or dislocation. There is no evidence of bone destruction to suggest osteomyelitis. There is mild prominence of the dorsal soft tissues of the mid to forefoot.  IMPRESSION: Mild soft tissue swelling over the dorsum of the mid to forefoot. No evidence of osteomyelitis.  Mild degenerative change of the 1st MTP joint.   Electronically Signed   By: Marin Olp M.D.   On: 01/05/2013 18:15    Assessment/Plan Principal Problem:   Cellulitis Active Problems:   possible Lymphedema   1. Cellulitis The patient is presenting with complaints of right leg redness associated with thigh redness.He mentions he has recurrent cellulitis of the same foot.  He has been treated with clindamycin but that has not prevented further worsening of his  infection. Possible etiologies lower dose of clindamycin. At present I would admit him to the MedSurg unit. I will obtain blood cultures. Considering his recurrent cellulitis I will put him on IV vancomycin. I would also obtain an ultrasound of his foot for further workup.  DVT Prophylaxis: subcutaneous Heparin Nutrition: Regular diet advance as tolerated  Code Status: Full  Disposition: Admitted to inpatient in med-surge unit.  Author: Berle Mull, MD [facility] Pager: [phone] 01/06/2013, 1:34 AM    If 7PM-7AM, please contact night-coverage [email or web address] Password [email or web address]

## 2013-01-07 LAB — CBC
HCT: 38.7 % — ABNORMAL LOW (ref 39.0–52.0)
Hemoglobin: 13.4 g/dL (ref 13.0–17.0)
MCH: 29.1 pg (ref 26.0–34.0)
MCHC: 34.6 g/dL (ref 30.0–36.0)
MCV: 84.1 fL (ref 78.0–100.0)
Platelets: 187 10*3/uL (ref 150–400)
RBC: 4.6 MIL/uL (ref 4.22–5.81)
RDW: 13 % (ref 11.5–15.5)
WBC: 6.4 10*3/uL (ref 4.0–10.5)

## 2013-01-07 LAB — BASIC METABOLIC PANEL
BUN: 11 mg/dL (ref 6–23)
CO2: 25 mEq/L (ref 19–32)
Calcium: 8.8 mg/dL (ref 8.4–10.5)
Chloride: 105 mEq/L (ref 96–112)
Creatinine, Ser: 0.88 mg/dL (ref 0.50–1.35)
GFR calc Af Amer: >90 mL/min (ref >90)
GFR calc non Af Amer: >90 mL/min (ref >90)
GLUCOSE: 114 mg/dL — AB (ref 70–99)
Potassium: 4.3 mEq/L (ref 3.7–5.3)
SODIUM: 142 meq/L (ref 137–147)

## 2013-01-07 LAB — LIPID PANEL
Cholesterol: 156 mg/dL (ref 0–200)
HDL: 31 mg/dL — ABNORMAL LOW (ref >39)
LDL Cholesterol: 99 mg/dL (ref 0–99)
Total CHOL/HDL Ratio: 5 RATIO
Triglycerides: 132 mg/dL (ref <150)
VLDL: 26 mg/dL (ref 0–40)

## 2013-01-07 MED ORDER — DOXYCYCLINE HYCLATE 100 MG PO TBEC: 100.0000 mg | DELAYED_RELEASE_TABLET | Freq: Two times a day (BID) | ORAL | Status: DC

## 2013-01-07 MED ORDER — SENNA 8.6 MG PO TABS
1.0000 | ORAL_TABLET | Freq: Every day | ORAL | Status: DC
  Administered 2013-01-07: 8.6 mg via ORAL
  Filled 2013-01-07: qty 1

## 2013-01-07 MED ORDER — BISACODYL 5 MG PO TBEC: 10.0000 mg | DELAYED_RELEASE_TABLET | Freq: Every day | ORAL | Status: DC | PRN

## 2013-01-07 NOTE — Progress Notes (Signed)
Utilization review completed.  

## 2013-01-07 NOTE — Discharge Summary (Signed)
Physician Discharge Summary  Benjamin Mcdowell [id number] DOB: March 03, 1965 DOA: 01/05/2013  PCP: No PCP Per Patient  Admit date: 01/05/2013 Discharge date: 01/07/2013  Time spent: <30 minutes  Recommendations for Outpatient Follow-up:  Follow-up Information   Please follow up. (PCP, IN 1-2weeks, call for appt upon discharge)       Discharge Diagnoses:  Principal Problem:   Cellulitis Active Problems:   possible Lymphedema   High triglycerides   Discharge Condition: Improved/stable  Diet recommendation: regular  Filed Weights   01/06/13 0147  Weight: 116 kg (255 lb 11.7 oz)    History of present illness:  Benjamin Mcdowell is a 48 y.o. male with Past medical history of Recurrent cellulitis of the right foot and Chronic swelling Of the right foot. He presented with complaints of swelling with pain of the right foot that has been ongoing since last to 3 days. He initially noted a swelling on Monday and he went to the ED on Tuesday at which time he was placed on clindamycin 300 mg 3 times a day and was discharged home. Despite taking the medication his swelling continues to worsen and then was also treated with redness and vesicular lesions on the right foot and therefore he came to the ED today again on Wednesday. He was given one dose of IV ceftriaxone At [facility] transferred to [facility]. He reported that he has recurrent cellulitis since he was child. He was also told that he has some lymph node damage that's why he has chronic swelling of the right foot.He has recurrent cellulitis with last episode being in June and July at which time he was treated with antibiotics.  He also mentions that his symptoms initially started as a pain in his right high 80 a which was later on turned red. And then he started having the redness of the foot. He denies any rash anywhere else. He was admitted for further evaluation and management the    Hospital Course:  Right leg cellulitis with purpura   -Discussed above, upon admission patient was placed on empiric diabetes with vancomycin and Rocephin. -Ultrasound negative for DVT.  -Platelet count is within normal limits -He is clinically improved and reports able to walk without pain at this time with less edema and erythema; remains dynamically stable with no leukocytosis. -He discharged on oral antibiotics at this time and is to followup with his PCP, but he willestablish care with a PCP call and make an appointment upon discharge. ?History of hypertriglyceridemia -His triglycerides level on followup today is 132 within normal limits.    Procedures:  none  Consultations:  none  Discharge Exam: Filed Vitals:   01/07/13 1318  BP: 146/72  Pulse: 70  Temp: 98.4 F (36.9 C)  Resp: 18  Exam:  General: alert & oriented x 3 In NAD Cardiovascular: RRR, nl S1 s2 Respiratory: CTAB Abdomen: soft +BS NT/ND, no masses palpable Extremities: Right lower extremity  decreased edema, much decreased erythema, nontender. Several Purpuric lesions on dorsum of foot No cyanosis.     Discharge Instructions  Discharge Orders   Future Orders Complete By Expires   Diet general  As directed    Increase activity slowly  As directed        Medication List    STOP taking these medications       clindamycin 150 MG capsule  Commonly known as:  CLEOCIN      TAKE these medications       doxycycline 100  MG EC tablet  Commonly known as:  DORYX  Take 1 tablet (100 mg total) by mouth 2 (two) times daily.     ibuprofen 200 MG tablet  Commonly known as:  ADVIL,MOTRIN  Take 600 mg by mouth every 6 (six) hours as needed. Soreness and pain       No Known Allergies     Follow-up Information   Please follow up. (PCP, IN 1-2weeks, call for appt upon discharge)        The results of significant diagnostics from this hospitalization (including imaging, microbiology, ancillary and laboratory) are listed below for reference.     Significant Diagnostic Studies: Dg Foot Complete Right  01/20/2013   CLINICAL DATA:  Wound check. Foot pain with swelling and erythema. No injury.  EXAM: RIGHT FOOT COMPLETE - 3+ VIEW  COMPARISON:  None.  FINDINGS: There are mild degenerative changes of the 1st MTP joint. There is no acute fracture or dislocation. There is no evidence of bone destruction to suggest osteomyelitis. There is mild prominence of the dorsal soft tissues of the mid to forefoot.  IMPRESSION: Mild soft tissue swelling over the dorsum of the mid to forefoot. No evidence of osteomyelitis.  Mild degenerative change of the 1st MTP joint.   Electronically Signed   By: Marin Olp M.D.   On: January 20, 2013 18:15    Microbiology: Recent Results (from the past 240 hour(s))  CULTURE, BLOOD (ROUTINE X 2)     Status: None   Collection Time    01/06/13  1:20 AM      Result Value Range Status   Specimen Description BLOOD RIGHT ARM   Final   Special Requests BOTTLES DRAWN AEROBIC ONLY 10CC   Final   Culture  Setup Time     Final   Value: 01/06/2013 04:18     Performed at [facility]   Culture     Final   Value:        BLOOD CULTURE RECEIVED NO GROWTH TO DATE CULTURE WILL BE HELD FOR 5 DAYS BEFORE ISSUING A FINAL NEGATIVE REPORT     Performed at [facility]   Report Status PENDING   Incomplete  CULTURE, BLOOD (ROUTINE X 2)     Status: None   Collection Time    01/06/13  1:30 AM      Result Value Range Status   Specimen Description BLOOD LEFT ARM   Final   Special Requests BOTTLES DRAWN AEROBIC AND ANAEROBIC 10CC EACH   Final   Culture  Setup Time     Final   Value: 01/06/2013 04:19     Performed at [facility]   Culture     Final   Value:        BLOOD CULTURE RECEIVED NO GROWTH TO DATE CULTURE WILL BE HELD FOR 5 DAYS BEFORE ISSUING A FINAL NEGATIVE REPORT     Performed at [facility]   Report Status PENDING   Incomplete     Labs: Basic Metabolic Panel:  Recent Labs Lab  01/04/13 1530 01-20-2013 1650 01/06/13 0358 01/07/13 0405  NA 140 142 142 142  K 4.0 4.1 4.1 4.3  CL 100 103 103 105  CO2 28 27 25 25   GLUCOSE 96 115* 130* 114*  BUN 13 13 12 11   CREATININE 1.10 1.10 0.95 0.88  CALCIUM 8.9 9.1 8.7 8.8   Liver Function Tests:  Recent Labs Lab 01-20-2013 1650 01/06/13 0358  AST 30 35  ALT  66* 65*  ALKPHOS 59 57  BILITOT 0.7 0.4  PROT 7.1 6.5  ALBUMIN 3.7 3.3*   No results found for this basename: LIPASE, AMYLASE,  in the last 168 hours No results found for this basename: AMMONIA,  in the last 168 hours CBC:  Recent Labs Lab 01/04/13 1530 01/05/13 1650 01/06/13 0358 01/06/13 0910 01/07/13 0405  WBC 12.7* 8.9 7.3  --  6.4  NEUTROABS  --  6.2  --   --   --   HGB 14.2 13.7 8.0* 13.3 13.4  HCT 41.3 39.5 37.1* 38.2* 38.7*  MCV 83.4 83.2 84.7  --  84.1  PLT 160 176 163  --  187   Cardiac Enzymes: No results found for this basename: CKTOTAL, CKMB, CKMBINDEX, TROPONINI,  in the last 168 hours BNP: BNP (last 3 results) No results found for this basename: PROBNP,  in the last 8760 hours CBG: No results found for this basename: GLUCAP,  in the last 168 hours     Signed:  Laurice Iglesia C  [facility] 01/07/2013, 3:02 PM

## 2013-01-10 LAB — GLUCOSE, CAPILLARY: GLUCOSE-CAPILLARY: 419 mg/dL — AB (ref 70–99)

## 2013-01-12 LAB — CULTURE, BLOOD (ROUTINE X 2)
CULTURE: NO GROWTH
Culture: NO GROWTH

## 2014-07-31 ENCOUNTER — Telehealth: Payer: Self-pay | Admitting: General Practice

## 2014-07-31 NOTE — Telephone Encounter (Signed)
Pt states his wife made this new pt appt for him and he works on Thursday. Wife forgot about this and pt would like to reschedule for any day Except on a thurs. Pt's wife sees you too. Is it ok to work in in august?

## 2014-07-31 NOTE — Telephone Encounter (Signed)
In 30 minute slot please. Thanks.

## 2014-08-02 NOTE — Telephone Encounter (Signed)
Lm on vm to cb and sch appt

## 2014-08-03 ENCOUNTER — Ambulatory Visit: Payer: Managed Care, Other (non HMO) | Admitting: Family Medicine

## 2014-09-04 ENCOUNTER — Encounter: Payer: Self-pay | Admitting: Family Medicine

## 2014-09-04 ENCOUNTER — Ambulatory Visit (INDEPENDENT_AMBULATORY_CARE_PROVIDER_SITE_OTHER): Payer: Managed Care, Other (non HMO) | Admitting: Family Medicine

## 2014-09-04 VITALS — BP 108/82 | HR 68 | Temp 98.2°F | Ht 71.5 in | Wt 262.0 lb

## 2014-09-04 DIAGNOSIS — E669 Obesity, unspecified: Secondary | ICD-10-CM | POA: Diagnosis not present

## 2014-09-04 DIAGNOSIS — R609 Edema, unspecified: Secondary | ICD-10-CM

## 2014-09-04 DIAGNOSIS — I890 Lymphedema, not elsewhere classified: Secondary | ICD-10-CM

## 2014-09-04 DIAGNOSIS — Z7189 Other specified counseling: Secondary | ICD-10-CM

## 2014-09-04 DIAGNOSIS — E781 Pure hyperglyceridemia: Secondary | ICD-10-CM

## 2014-09-04 DIAGNOSIS — Z23 Encounter for immunization: Secondary | ICD-10-CM | POA: Diagnosis not present

## 2014-09-04 DIAGNOSIS — Z7689 Persons encountering health services in other specified circumstances: Secondary | ICD-10-CM

## 2014-09-04 NOTE — Patient Instructions (Signed)
BEFORE YOU LEAVE: -schedule physical in about 3-4 months, come fasting but drink plenty of water -flu shot  Get record of last tetanus booster  Elevate legs for 30 minutes daily above waist, use compression on R foot to knee as much as possible, remove at night  -We placed a referral for you as discussed to the vascular doctor per your request. It usually takes about 1-2 weeks to process and schedule this referral. If you have not heard from [place] regarding this appointment in 2 weeks please contact our office.  We recommend the following healthy lifestyle measures: - eat a healthy diet consisting of small portions of vegetables, fruits, beans, nuts, seeds, healthy meats such as white chicken and fish and whole grains.  - avoid fried foods, sweets, starches, carbs, fast food, processed foods, sodas, red meet and other fattening foods.  - get a least 150-300 minutes of aerobic exercise per week.

## 2014-09-04 NOTE — Progress Notes (Signed)
Pre visit review using our clinic review tool, if applicable. No additional management support is needed unless otherwise documented below in the visit note. 

## 2014-09-04 NOTE — Progress Notes (Signed)
HPI:  Benjamin Mcdowell is here to establish care. He has not had a PCP in the past. Chronic issues include LE edema and obesity. Reports developed serious foot infection at age 49 in R foot. Since then has had progressively worsening R foot and R lower ext edema and recurrent cellulitis. Mild bild ankle edema but much worse on the R. He made this appointment as he wants a referral to vascular or vein specialist for this issue. He recently started exercising 4 days per week and is starting to eat better.   ROS negative for unless reported above: fevers, unintentional weight loss, hearing or vision loss, chest pain, palpitations, struggling to breath, hemoptysis, melena, hematochezia, hematuria, falls, loc, si, thoughts of self harm  Past Medical History  Diagnosis Date  . Anal fissure   . Cellulitis     recurrent cellulitis right foot-per patient-per patient in hospital for IV antibiotics 01/2012  . Edema     LE, R>L since child hood  . Hemorrhoids     Past Surgical History  Procedure Laterality Date  . Penis revascularization surgery    . Umbilical hernia repair  12/24/2007    with Proceed ventral    Family History  Problem Relation Age of Onset  . Prostate cancer Father   . Heart disease      GRANDFATHER    Social History   Social History  . Marital Status: Married    Spouse Name: N/A  . Number of Children: N/A  . Years of Education: N/A   Social History Main Topics  . Smoking status: Never Smoker   . Smokeless tobacco: Never Used  . Alcohol Use: Yes     Comment: occasional  . Drug Use: Yes    Special: Marijuana     Comment: occasional  . Sexual Activity: Not Asked   Other Topics Concern  . None   Social History Narrative    No current outpatient prescriptions on file.  EXAM:  Filed Vitals:   09/04/14 1004  BP: 108/82  Pulse: 68  Temp: 98.2 F (36.8 C)    Body mass index is 36.04 kg/(m^2).  GENERAL: vitals reviewed and listed above, alert,  oriented, appears well hydrated and in no acute distress  HEENT: atraumatic, conjunttiva clear, no obvious abnormalities on inspection of external nose and ears  NECK: no obvious masses on inspection  LUNGS: clear to auscultation bilaterally, no wheezes, rales or rhonchi, good air movement  CV: HRRR, 2 + pitting edema R foot and ankle to mid calf, 1 + ankle edema L  MS: moves all extremities without noticeable abnormality  PSYCH: pleasant and cooperative, no obvious depression or anxiety  ASSESSMENT AND PLAN:  Discussed the following assessment and plan:  Encounter to establish care  Edema - Plan: Ambulatory referral to Vascular Surgery -referral per his request - query post inf chronic LE edema related to cellulitis as a child -advised elevation and compression in interim   High triglycerides Obesity  -lifestyle recs, labs - he prefers to do at physical  -We reviewed the PMH, PSH, FH, SH, Meds and Allergies. -We provided refills for any medications we will prescribe as needed. -We addressed current concerns per orders and patient instructions. -We have asked for records for pertinent exams, studies, vaccines and notes from previous providers. -We have advised patient to follow up per instructions below.   -Patient advised to return or notify a doctor immediately if symptoms worsen or persist or new concerns arise.  Patient  Instructions  BEFORE YOU LEAVE: -schedule physical in about 3-4 months, come fasting but drink plenty of water -flu shot  Get record of last tetanus booster  Elevate legs for 30 minutes daily above waist, use compression on R foot to knee as much as possible, remove at night  -We placed a referral for you as discussed to the vascular doctor per your request. It usually takes about 1-2 weeks to process and schedule this referral. If you have not heard from [place] regarding this appointment in 2 weeks please contact our office.  We recommend the following  healthy lifestyle measures: - eat a healthy diet consisting of small portions of vegetables, fruits, beans, nuts, seeds, healthy meats such as white chicken and fish and whole grains.  - avoid fried foods, sweets, starches, carbs, fast food, processed foods, sodas, red meet and other fattening foods.  - get a least 150-300 minutes of aerobic exercise per week.       Colin Benton R.

## 2014-09-06 ENCOUNTER — Other Ambulatory Visit: Payer: Self-pay

## 2014-09-06 DIAGNOSIS — R609 Edema, unspecified: Secondary | ICD-10-CM

## 2014-10-17 ENCOUNTER — Encounter: Payer: Self-pay | Admitting: Vascular Surgery

## 2014-10-18 ENCOUNTER — Ambulatory Visit (INDEPENDENT_AMBULATORY_CARE_PROVIDER_SITE_OTHER): Payer: Managed Care, Other (non HMO) | Admitting: Vascular Surgery

## 2014-10-18 ENCOUNTER — Encounter: Payer: Self-pay | Admitting: Vascular Surgery

## 2014-10-18 ENCOUNTER — Ambulatory Visit (HOSPITAL_COMMUNITY)
Admission: RE | Admit: 2014-10-18 | Discharge: 2014-10-18 | Disposition: A | Discharge status: Home / Self Care | Payer: Managed Care, Other (non HMO) | Source: Ambulatory Visit | Attending: Vascular Surgery | Admitting: Vascular Surgery

## 2014-10-18 VITALS — BP 137/81 | HR 71 | Temp 98.3°F | Resp 16 | Ht 72.0 in | Wt 261.0 lb

## 2014-10-18 DIAGNOSIS — I890 Lymphedema, not elsewhere classified: Secondary | ICD-10-CM

## 2014-10-18 DIAGNOSIS — L03115 Cellulitis of right lower limb: Secondary | ICD-10-CM | POA: Diagnosis not present

## 2014-10-18 DIAGNOSIS — I872 Venous insufficiency (chronic) (peripheral): Secondary | ICD-10-CM | POA: Diagnosis not present

## 2014-10-18 DIAGNOSIS — R609 Edema, unspecified: Secondary | ICD-10-CM

## 2014-10-18 NOTE — Progress Notes (Signed)
[facility] and [facility]in Specialist of [facility]  Patient name: Benjamin Mcdowell MRN: [id number] DOB: 02/20/1965 Sex: male  REASON FOR CONSULT: Right lower extremity edema and cellulitis of the right leg. Referred by Dr.[name]  HPI: Benjamin Mcdowell is a 49 y.o. male, who is has had intermittent swelling in his right leg since she was 49 years old. He had developed an infection in the foot which is when the problem began. He separately had multiple episodes of cellulitis in the right leg. Recently he's had increased swelling in the right leg and presents for evaluation. He's had minimal swelling on the left side. He really has no pain associated with this swelling. His swelling is worse when he's been standing all day. It is relieved with elevation. 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 He is unaware of any previous history of DVT or phlebitis.  I have reviewed the records from Dr. Julianne Rice office. The patient was being evaluated for chronic lower extremity edema and cellulitis. Patient also has a history of high triglycerides and obesity.  Past Medical History  Diagnosis Date  . Anal fissure   . Cellulitis     recurrent cellulitis right foot-per patient-per patient in hospital for IV antibiotics 01/2012  . Edema     LE, R>L since child hood  . Hemorrhoids    Family History  Problem Relation Age of Onset  . Prostate cancer Father   . Heart disease      GRANDFATHER   SOCIAL HISTORY: Social History   Social History  . Marital Status: Married    Spouse Name: N/A  . Number of Children: N/A  . Years of Education: N/A   Occupational History  . Not on file.   Social History Main Topics  . Smoking status: Never Smoker   . Smokeless tobacco: Never Used  . Alcohol Use: 0.0 oz/week    0 Standard drinks or equivalent per week     Comment: occasional  . Drug Use: Yes    Special: Marijuana     Comment: occasional  . Sexual Activity:  Not on file   Other Topics Concern  . Not on file   Social History Narrative   Work or School: Insurance account manager Situation: lives with wife and 36 yo twins in 2016      Spiritual Beliefs: non practicing Christian      Lifestyle: started exercise and diet changes in summer 2016         No Known Allergies No current outpatient prescriptions on file.   No current facility-administered medications for this visit.   REVIEW OF SYSTEMS:  [X]  denotes positive finding, [ ]  denotes negative finding Cardiac  Comments:  Chest pain or chest pressure:    Shortness of breath upon exertion:    Short of breath when lying flat:    Irregular heart rhythm:        Vascular    Pain in calf, thigh, or hip brought on by ambulation:    Pain in feet at night that wakes you up from your sleep:     Blood clot in your veins:    Leg swelling:  X Right leg      Pulmonary    Oxygen at home:    Productive cough:     Wheezing:         Neurologic    Sudden weakness in arms or legs:  Sudden numbness in arms or legs:     Sudden onset of difficulty speaking or slurred speech:    Temporary loss of vision in one eye:     Problems with dizziness:         Gastrointestinal    Blood in stool:     Vomited blood:         Genitourinary    Burning when urinating:     Blood in urine:        Psychiatric    Major depression:         Hematologic    Bleeding problems:    Problems with blood clotting too easily:        Skin    Rashes or ulcers:        Constitutional    Fever or chills:      PHYSICAL EXAM: Filed Vitals:   10/18/14 0853  BP: 137/81  Pulse: 71  Temp: 98.3 F (36.8 C)  Resp: 16  Height: 6' (1.829 m)  Weight: 261 lb (118.389 kg)  SpO2: 99%   GENERAL: The patient is a well-nourished male, in no acute distress. The vital signs are documented above. CARDIAC: There is a regular rate and rhythm.  VASCULAR: I do not detect carotid bruits. He has palpable femoral pulses  and palpable pedal pulses bilaterally. He has right lower extremity swelling involving his ankle and his foot. PULMONARY: There is good air exchange bilaterally without wheezing or rales. ABDOMEN: Soft and non-tender with normal pitched bowel sounds.  MUSCULOSKELETAL: There are no major deformities or cyanosis. NEUROLOGIC: No focal weakness or paresthesias are detected. SKIN: There are no ulcers or rashes noted. PSYCHIATRIC: The patient has a normal affect.  DATA:  RIGHT LOWER EXTREMITY VENOUS DUPLEX :  I have independently interpreted his right lower extremity venous duplex scan. There is no evidence of DVT in the right lower extremity. There is reflux in the right common femoral vein and popliteal vein and at the saphenofemoral junction.  There is no significant reflux in the great saphenous vein.   MEDICAL ISSUES:  CHRONIC VENOUS INSUFFICIENCY AND LYMPHEDEMA: Based on his duplex study he clearly has some evidence of chronic venous insufficiency with reflux in the deep system. In addition because of his history of multiple previous infections think he also has some lymphedema. Regardless, the treatment is exactly the same. We have discussed the importance of intermittent leg elevation and the proper positioning for this. In addition, I have written him a prescription for knee-high compression stockings with a gradient of 15-20 mmHg. I have encouraged him to exercise as much as possible and to avoid prolonged sitting and standing. I'll be happy to see him back at any time if his symptoms or swelling progresses.  Deitra Mayo [facility] [place]: [phone]

## 2014-12-21 ENCOUNTER — Inpatient Hospital Stay (HOSPITAL_COMMUNITY): Payer: Managed Care, Other (non HMO)

## 2014-12-21 ENCOUNTER — Inpatient Hospital Stay (HOSPITAL_COMMUNITY)
Admission: EM | Admit: 2014-12-21 | Discharge: 2014-12-28 | DRG: 872 | Disposition: A | Discharge status: Home Health | Payer: Managed Care, Other (non HMO) | Attending: Internal Medicine | Admitting: Internal Medicine

## 2014-12-21 ENCOUNTER — Encounter (HOSPITAL_COMMUNITY): Payer: Self-pay | Admitting: Emergency Medicine

## 2014-12-21 DIAGNOSIS — E669 Obesity, unspecified: Secondary | ICD-10-CM | POA: Diagnosis present

## 2014-12-21 DIAGNOSIS — D692 Other nonthrombocytopenic purpura: Secondary | ICD-10-CM | POA: Insufficient documentation

## 2014-12-21 DIAGNOSIS — N179 Acute kidney failure, unspecified: Secondary | ICD-10-CM

## 2014-12-21 DIAGNOSIS — R600 Localized edema: Secondary | ICD-10-CM | POA: Diagnosis present

## 2014-12-21 DIAGNOSIS — Z6835 Body mass index (BMI) 35.0-35.9, adult: Secondary | ICD-10-CM | POA: Diagnosis not present

## 2014-12-21 DIAGNOSIS — R609 Edema, unspecified: Secondary | ICD-10-CM | POA: Diagnosis not present

## 2014-12-21 DIAGNOSIS — R739 Hyperglycemia, unspecified: Secondary | ICD-10-CM | POA: Diagnosis present

## 2014-12-21 DIAGNOSIS — A419 Sepsis, unspecified organism: Secondary | ICD-10-CM | POA: Diagnosis present

## 2014-12-21 DIAGNOSIS — N171 Acute kidney failure with acute cortical necrosis: Secondary | ICD-10-CM | POA: Diagnosis not present

## 2014-12-21 DIAGNOSIS — B9689 Other specified bacterial agents as the cause of diseases classified elsewhere: Secondary | ICD-10-CM | POA: Diagnosis not present

## 2014-12-21 DIAGNOSIS — L03115 Cellulitis of right lower limb: Secondary | ICD-10-CM | POA: Diagnosis present

## 2014-12-21 DIAGNOSIS — R05 Cough: Secondary | ICD-10-CM

## 2014-12-21 DIAGNOSIS — L52 Erythema nodosum: Secondary | ICD-10-CM | POA: Insufficient documentation

## 2014-12-21 DIAGNOSIS — I890 Lymphedema, not elsewhere classified: Secondary | ICD-10-CM | POA: Diagnosis present

## 2014-12-21 DIAGNOSIS — E781 Pure hyperglyceridemia: Secondary | ICD-10-CM | POA: Diagnosis present

## 2014-12-21 DIAGNOSIS — L270 Generalized skin eruption due to drugs and medicaments taken internally: Secondary | ICD-10-CM | POA: Insufficient documentation

## 2014-12-21 DIAGNOSIS — L0390 Cellulitis, unspecified: Secondary | ICD-10-CM

## 2014-12-21 DIAGNOSIS — R21 Rash and other nonspecific skin eruption: Secondary | ICD-10-CM | POA: Diagnosis present

## 2014-12-21 DIAGNOSIS — R059 Cough, unspecified: Secondary | ICD-10-CM

## 2014-12-21 DIAGNOSIS — R6520 Severe sepsis without septic shock: Secondary | ICD-10-CM | POA: Diagnosis not present

## 2014-12-21 DIAGNOSIS — N178 Other acute kidney failure: Secondary | ICD-10-CM | POA: Diagnosis not present

## 2014-12-21 DIAGNOSIS — L959 Vasculitis limited to the skin, unspecified: Secondary | ICD-10-CM | POA: Insufficient documentation

## 2014-12-21 HISTORY — DX: Cellulitis of right lower limb: L03.115

## 2014-12-21 HISTORY — DX: Unspecified malignant neoplasm of skin of other part of trunk: C44.509

## 2014-12-21 LAB — BASIC METABOLIC PANEL
Anion gap: 8 (ref 5–15)
BUN: 19 mg/dL (ref 6–20)
CO2: 27 mmol/L (ref 22–32)
Calcium: 8.9 mg/dL (ref 8.9–10.3)
Chloride: 102 mmol/L (ref 101–111)
Creatinine, Ser: 1.41 mg/dL — ABNORMAL HIGH (ref 0.61–1.24)
GFR, EST NON AFRICAN AMERICAN: 57 mL/min — AB (ref >60)
Glucose, Bld: 139 mg/dL — ABNORMAL HIGH (ref 65–99)
POTASSIUM: 4.3 mmol/L (ref 3.5–5.1)
SODIUM: 137 mmol/L (ref 135–145)

## 2014-12-21 LAB — URINALYSIS, ROUTINE W REFLEX MICROSCOPIC
Bilirubin Urine: NEGATIVE
GLUCOSE, UA: 100 mg/dL — AB
HGB URINE DIPSTICK: NEGATIVE
Ketones, ur: NEGATIVE mg/dL
LEUKOCYTES UA: NEGATIVE
Nitrite: NEGATIVE
PROTEIN: NEGATIVE mg/dL
SPECIFIC GRAVITY, URINE: 1.014 (ref 1.005–1.030)
pH: 6 (ref 5.0–8.0)

## 2014-12-21 LAB — CBC WITH DIFFERENTIAL/PLATELET
Basophils Absolute: 0 10*3/uL (ref 0.0–0.1)
Basophils Relative: 0 %
EOS ABS: 0 10*3/uL (ref 0.0–0.7)
EOS PCT: 0 %
HCT: 43.1 % (ref 39.0–52.0)
HEMOGLOBIN: 14.7 g/dL (ref 13.0–17.0)
LYMPHS ABS: 0.6 10*3/uL — AB (ref 0.7–4.0)
LYMPHS PCT: 5 %
MCH: 28.7 pg (ref 26.0–34.0)
MCHC: 34.1 g/dL (ref 30.0–36.0)
MCV: 84.2 fL (ref 78.0–100.0)
Monocytes Absolute: 0.8 10*3/uL (ref 0.1–1.0)
Monocytes Relative: 5 %
Neutro Abs: 12.9 10*3/uL — ABNORMAL HIGH (ref 1.7–7.7)
Neutrophils Relative %: 90 %
PLATELETS: 169 10*3/uL (ref 150–400)
RBC: 5.12 MIL/uL (ref 4.22–5.81)
RDW: 13.7 % (ref 11.5–15.5)
WBC: 14.3 10*3/uL — AB (ref 4.0–10.5)

## 2014-12-21 LAB — LACTIC ACID, PLASMA
LACTIC ACID, VENOUS: 2.1 mmol/L — AB (ref 0.5–2.0)
LACTIC ACID, VENOUS: 1.1 mmol/L (ref 0.5–2.0)
LACTIC ACID, VENOUS: 3.4 mmol/L — AB (ref 0.5–2.0)

## 2014-12-21 LAB — I-STAT CG4 LACTIC ACID, ED: LACTIC ACID, VENOUS: 1.15 mmol/L (ref 0.5–2.0)

## 2014-12-21 LAB — PROCALCITONIN: Procalcitonin: 2.62 ng/mL

## 2014-12-21 MED ORDER — SODIUM CHLORIDE 0.9 % IV BOLUS (SEPSIS)
500.0000 mL | Freq: Once | INTRAVENOUS | Status: AC
  Administered 2014-12-21: 500 mL via INTRAVENOUS

## 2014-12-21 MED ORDER — ONDANSETRON HCL 4 MG/2ML IJ SOLN
4.0000 mg | Freq: Once | INTRAMUSCULAR | Status: DC
  Filled 2014-12-21: qty 2

## 2014-12-21 MED ORDER — VANCOMYCIN HCL IN DEXTROSE 750-5 MG/150ML-% IV SOLN
750.0000 mg | Freq: Two times a day (BID) | INTRAVENOUS | Status: DC
  Administered 2014-12-21: 750 mg via INTRAVENOUS
  Filled 2014-12-21 (×4): qty 150

## 2014-12-21 MED ORDER — PIPERACILLIN-TAZOBACTAM 3.375 G IVPB
3.3750 g | Freq: Three times a day (TID) | INTRAVENOUS | Status: DC
  Administered 2014-12-21 – 2014-12-23 (×6): 3.375 g via INTRAVENOUS
  Filled 2014-12-21 (×8): qty 50

## 2014-12-21 MED ORDER — PIPERACILLIN-TAZOBACTAM 3.375 G IVPB 30 MIN
3.3750 g | Freq: Once | INTRAVENOUS | Status: AC
  Administered 2014-12-21: 3.375 g via INTRAVENOUS
  Filled 2014-12-21: qty 50

## 2014-12-21 MED ORDER — ACETAMINOPHEN 325 MG PO TABS
650.0000 mg | ORAL_TABLET | Freq: Four times a day (QID) | ORAL | Status: DC | PRN
  Administered 2014-12-21 – 2014-12-25 (×11): 650 mg via ORAL
  Filled 2014-12-21 (×11): qty 2

## 2014-12-21 MED ORDER — MORPHINE SULFATE (PF) 4 MG/ML IV SOLN
4.0000 mg | INTRAVENOUS | Status: DC | PRN
  Filled 2014-12-21: qty 1

## 2014-12-21 MED ORDER — SODIUM CHLORIDE 0.9 % IV SOLN
2500.0000 mg | Freq: Once | INTRAVENOUS | Status: AC
  Administered 2014-12-21: 2500 mg via INTRAVENOUS
  Filled 2014-12-21: qty 2500

## 2014-12-21 MED ORDER — ENOXAPARIN SODIUM 40 MG/0.4ML ~~LOC~~ SOLN
40.0000 mg | SUBCUTANEOUS | Status: DC
  Administered 2014-12-21: 40 mg via SUBCUTANEOUS
  Filled 2014-12-21: qty 0.4

## 2014-12-21 MED ORDER — MORPHINE SULFATE (PF) 2 MG/ML IV SOLN
1.0000 mg | INTRAVENOUS | Status: DC | PRN
  Administered 2014-12-21 – 2014-12-25 (×3): 2 mg via INTRAVENOUS
  Filled 2014-12-21 (×3): qty 1

## 2014-12-21 MED ORDER — MORPHINE SULFATE (PF) 4 MG/ML IV SOLN
4.0000 mg | Freq: Once | INTRAVENOUS | Status: AC
  Administered 2014-12-21: 4 mg via INTRAVENOUS

## 2014-12-21 MED ORDER — SODIUM CHLORIDE 0.9 % IV SOLN
INTRAVENOUS | Status: DC
  Administered 2014-12-21 – 2014-12-26 (×10): via INTRAVENOUS

## 2014-12-21 MED ORDER — VANCOMYCIN HCL IN DEXTROSE 1-5 GM/200ML-% IV SOLN: 1000.0000 mg | Freq: Once | INTRAVENOUS | Status: DC

## 2014-12-21 MED ORDER — ACETAMINOPHEN 325 MG PO TABS
650.0000 mg | ORAL_TABLET | Freq: Once | ORAL | Status: AC
  Administered 2014-12-21: 650 mg via ORAL
  Filled 2014-12-21: qty 2

## 2014-12-21 MED ORDER — OXYCODONE HCL 5 MG PO TABS
5.0000 mg | ORAL_TABLET | ORAL | Status: DC | PRN
Start: 2014-12-21 — End: 2014-12-28
  Administered 2014-12-22 – 2014-12-28 (×20): 5 mg via ORAL
  Filled 2014-12-21 (×21): qty 1

## 2014-12-21 MED ORDER — SODIUM CHLORIDE 0.9 % IV BOLUS (SEPSIS)
1000.0000 mL | Freq: Once | INTRAVENOUS | Status: AC
  Administered 2014-12-21: 1000 mL via INTRAVENOUS

## 2014-12-21 MED ORDER — IBUPROFEN 800 MG PO TABS
800.0000 mg | ORAL_TABLET | Freq: Once | ORAL | Status: AC
Start: 2014-12-21 — End: 2014-12-21
  Administered 2014-12-21: 800 mg via ORAL
  Filled 2014-12-21: qty 1

## 2014-12-21 MED ORDER — ACETAMINOPHEN 650 MG RE SUPP: 650.0000 mg | Freq: Four times a day (QID) | RECTAL | Status: DC | PRN

## 2014-12-21 MED ORDER — ONDANSETRON HCL 4 MG/2ML IJ SOLN
4.0000 mg | Freq: Once | INTRAMUSCULAR | Status: AC
  Administered 2014-12-21: 4 mg via INTRAVENOUS

## 2014-12-21 NOTE — Progress Notes (Signed)
CRITICAL VALUE ALERT  Critical value received: Lactic Acid  Date of notification:  12/21/2014  Time of notification:  2011    Critical value read back:Yes.    Nurse who received alert: [place] Merchant navy officer  MD notified (1st page): Schorr, NP  Time of first page:  2012  Responding MD: Hilbert Bible, NP

## 2014-12-21 NOTE — ED Notes (Signed)
Right lower leg and foot reddened/swollen/warm- has reddened streak up leg.

## 2014-12-21 NOTE — ED Notes (Signed)
Dr james at bedside. 

## 2014-12-21 NOTE — ED Notes (Signed)
Lactic acid result called by  Lab.

## 2014-12-21 NOTE — Progress Notes (Signed)
Report called and received from [facility] in ED.

## 2014-12-21 NOTE — Progress Notes (Addendum)
ANTIBIOTIC CONSULT NOTE - INITIAL  Pharmacy Consult for Vancomycin/Zosyn Indication: Cellulitis  No Known Allergies  Patient Measurements: Height: 6' (182.9 cm) Weight: 260 lb (117.935 kg) IBW/kg (Calculated) : 77.6  Vital Signs: Temp: 102.6 F (39.2 C) (12/15 0834) Temp Source: Oral (12/15 0834) BP: 155/77 mmHg (12/15 0817) Pulse Rate: 102 (12/15 0817) Intake/Output from previous day:   Intake/Output from this shift:    Labs: No results for input(s): WBC, HGB, PLT, LABCREA, CREATININE in the last 72 hours. CrCl cannot be calculated (Patient has no serum creatinine result on file.). No results for input(s): VANCOTROUGH, VANCOPEAK, VANCORANDOM, GENTTROUGH, GENTPEAK, GENTRANDOM, TOBRATROUGH, TOBRAPEAK, TOBRARND, AMIKACINPEAK, AMIKACINTROU, AMIKACIN in the last 72 hours.   Microbiology: No results found for this or any previous visit (from the past 720 hour(s)).  Medical History: Past Medical History  Diagnosis Date  . Anal fissure   . Cellulitis     recurrent cellulitis right foot-per patient-per patient in hospital for IV antibiotics 01/2012  . Edema     LE, R>L since child hood  . Hemorrhoids     Medications:  Infusions:     Assessment: 40 yoM presenting to the ED with complaints of right lower leg and foot redness/swelling/warmth. Pt has hx of cellulitis. Pharmacy consulted to dose Vancomycin/Zosyn. WBC 14.3, Tmax 102.6  Goal of Therapy:  Vancomycin trough level 10-15 mcg/ml  Plan:  Vancomycin LD 2500mg  x1 dose, followed by 750mg  Q12 Zosyn 3.375G over 52mins x1, followed by Zosyn 3.375G Q8 (EI- 4hours) Measure antibiotic drug levels at steady state Will continue to follow renal function, culture results, LOT, and antibiotic de-escalation plans    Darl Pikes, PharmD Clinical Pharmacist- Resident Pager: [phone]  Darl Pikes 12/21/2014,8:44 AM

## 2014-12-21 NOTE — H&P (Signed)
[facility] History and Physical                                                                                    Benjamin Mcdowell, is a 49 y.o. male  MRN: [id number]   DOB - 16-Dec-1965  Admit Date - 12/21/2014  Outpatient Primary MD for the patient is Lucretia Kern., DO  Referring MD: Jeneen Rinks / ER  With History of -  Past Medical History  Diagnosis Date  . Anal fissure   . Cellulitis     recurrent cellulitis right foot-per patient-per patient in hospital for IV antibiotics 01/2012  . Edema     LE, R>L since child hood  . Hemorrhoids       Past Surgical History  Procedure Laterality Date  . Penis revascularization surgery    . Umbilical hernia repair  12/24/2007    with Proceed ventral    in for   Chief Complaint  Patient presents with  . Cellulitis     HPI This is a 49 year old male with history of obesity, elevated triglycerides as well as chronic right lower extremity edema without prior DVT and without a diagnosis of lymphedema. Patient has a history of hospitalization several years ago related to cellulitis in the right lower extremity. Patient presented to the ER today with rapid onset of constitutional symptoms with right lower extremity redness swelling and thigh streaking less than 24 hours in onset. Patient reports that yesterday around lunchtime he "felt bad". By 5 PM he had flulike symptoms described as myalgias without nausea vomiting diarrhea cough and rhinorrhea or headache as the evening progressed to began to notice redness and increasing swelling in the foot extending up to the calf. By this morning there was some streaking in the medial thigh and he presented to the ER for further evaluation.  In the ER the patient has had a MAXIMUM TEMPERATURE of 103.12F associated rigors, he was hemodynamically stable with initial blood pressure reading 155/77 with follow-up blood pressure 131/81, he was mildly tachycardic with heart rates between 97 and 102 bpm,  respirations were normal between 12 and 20 breaths per minute, room air saturations were 97%. Clinical findings confirm cellulitic changes in the right lower extremity with solitary small lymph node palpable in the right groin. Patient did also have mild renal insufficiency with a creatinine of 1.41, mild hyperglycemia glucose 139, leukocytosis with white count 14,300 with neutrophils 90% absolute feels 12.9%. Blood cultures were obtained in the ER. Was treated a severe cellulitis without purulence and was started on empiric Zosyn and vancomycin. Prior to receipt of laboratory results per some was given Advil as well as Tylenol for his fever and rigors. He was given morphine for pain and Zofran for nausea   Review of Systems   In addition to the HPI above,  No Headache, changes with Vision or hearing, new weakness, tingling, numbness in any extremity, No problems swallowing food or Liquids, indigestion/reflux No Chest pain, Cough or Shortness of Breath, palpitations, orthopnea or DOE No Abdominal pain, N/V; no melena or hematochezia, no dark tarry stools, Bowel movements are regular, No dysuria, hematuria or flank pain No new  joints pains-aches No recent weight gain or loss No polyuria, polydypsia or polyphagia,  *A full 10 point Review of Systems was done, except as stated above, all other Review of Systems were negative.  Social History Social History  Substance Use Topics  . Smoking status: Never Smoker   . Smokeless tobacco: Never Used  . Alcohol Use: 3.6 oz/week    0 Standard drinks or equivalent, 6 Cans of beer per week     Comment: occasional    Resides at: Private residence  Lives with: Spouse  Ambulatory status: Without assistive devices   Family History Family History  Problem Relation Age of Onset  . Prostate cancer Father   . Heart disease      GRANDFATHER     Prior to Admission medications   Medication Sig Start Date End Date Taking? Authorizing Provider    ibuprofen (ADVIL,MOTRIN) 200 MG tablet Take 400 mg by mouth every 6 (six) hours as needed for moderate pain.   Yes Historical Provider, MD  protein supplement shake (PREMIER PROTEIN) LIQD Take 2 oz by mouth daily.   Yes Historical Provider, MD    No Known Allergies  Physical Exam  Vitals  Blood pressure 131/81, pulse 97, temperature 103.1 F (39.5 C), temperature source Oral, resp. rate 20, height 6' (1.829 m), weight 260 lb (117.935 kg), SpO2 99 %.   General:  In no acute distress but does appear to be mildly toxic, flushed in the upper body neck and face  Psych:  Normal affect, Denies Suicidal or Homicidal ideations, Awake Alert, Oriented X 3. Speech and thought patterns are clear and appropriate, no apparent short term memory deficits  Neuro:   No focal neurological deficits, CN II through XII intact, Strength 5/5 all 4 extremities, Sensation intact all 4 extremities.  ENT:  Ears and Eyes appear Normal, Conjunctivae clear, PER. Moist oral mucosa without erythema or exudates.  Neck:  Supple, No lymphadenopathy appreciated in neck  Respiratory:  Symmetrical chest wall movement, Good air movement bilaterally, CTAB. Room Air  Cardiac:  RRR, No Murmurs, no LE edema noted, no JVD, No carotid bruits, peripheral pulses palpable at 2+-due to significant edema right lower extremity unable to palpate pulses but patient has adequate capillary refill less than 2 seconds  Abdomen:  Positive bowel sounds, Soft, Non tender, Non distended,  No masses appreciated, no obvious hepatosplenomegaly  Skin:  No Cyanosis, Normal Skin Turgor, No Skin Rash or Bruise.  Extremities: Symmetrical without obvious trauma or injury,  no effusions.; Patient has marked edema right foot involving toes with edema extending halfway up the calf, the remainder of the right leg has no edema. There is an area of circumferential cellulitic changes with erythema and induration involving the calf extending to the toes in  these areas have been demarcated with a marking pen. He also has a linear area of fine cellulitic change in the right medial upper thigh and this area has been demarcated with a marking pen as well. There is no cording palpable. There is a small mobile somewhat tender lymph node palpable on the right groin.  Data Review  CBC  Recent Labs Lab 12/21/14 0900  WBC 14.3*  HGB 14.7  HCT 43.1  PLT 169  MCV 84.2  MCH 28.7  MCHC 34.1  RDW 13.7  LYMPHSABS 0.6*  MONOABS 0.8  EOSABS 0.0  BASOSABS 0.0    Chemistries   Recent Labs Lab 12/21/14 0900  NA 137  K 4.3  CL 102  CO2 27  GLUCOSE 139*  BUN 19  CREATININE 1.41*  CALCIUM 8.9    estimated creatinine clearance is 84 mL/min (by C-G formula based on Cr of 1.41).  No results for input(s): TSH, T4TOTAL, T3FREE, THYROIDAB in the last 72 hours.  Invalid input(s): FREET3  Coagulation profile No results for input(s): INR, PROTIME in the last 168 hours.  No results for input(s): DDIMER in the last 72 hours.  Cardiac Enzymes No results for input(s): CKMB, TROPONINI, MYOGLOBIN in the last 168 hours.  Invalid input(s): CK  Invalid input(s): POCBNP  Urinalysis    Component Value Date/Time   COLORURINE YELLOW 11/12/2010 E. Lopez 11/12/2010 0903   LABSPEC 1.013 11/12/2010 0903   PHURINE 6.0 11/12/2010 0903   GLUCOSEU NEGATIVE 11/12/2010 0903   HGBUR NEGATIVE 11/12/2010 0903   BILIRUBINUR NEGATIVE 11/12/2010 0903   KETONESUR NEGATIVE 11/12/2010 0903   PROTEINUR NEGATIVE 11/12/2010 0903   UROBILINOGEN 0.2 11/12/2010 0903   NITRITE NEGATIVE 11/12/2010 0903   LEUKOCYTESUR NEGATIVE 11/12/2010 0903    Imaging results:   No results found.   Assessment & Plan  Principal Problem:   Cellulitis of right lower extremity -Medical floor -Treat as severe nonpurulent cellulitis; empiric Zosyn and vancomycin with pharmacy managing -Follow up on blood cultures -Tylenol, oxycodone or morphine for  pain -Elevated extremity -Neurovascular checks every 4 hours -Monitor for pain disproportionate to clinical findings  Active Problems:   SIRS with acute organ dysfunction due to infectious process  -White count 14,000, temperature 103F, mild tachycardia, mild renal insufficiency consistent with at least SIRS criteria -I have ordered stat lactic acid to cycle every 3 hours for at least a total of 2 collections; if lactic acid significantly elevated can upgrade to sepsis criteria -Hemodynamically stable and no indication to initiate telemetry or higher level of care but will follow -Procalcitonin -Since hemodynamically stable gentle IV fluids    Acute renal failure -Appears to be secondary to infectious process, acute volume depletion and utilization of NSAIDs prior to admission -Minimize offending medications -Hydrated as above -Repeat lab in a.m.     Acute hyperglycemia -Likely related to acute infection -Check hemoglobin A1c    CHRONIC Edema of right lower extremity -Recently evaluated by vascular surgeon with duplex in October 2016 without evidence of DVT    Obesity, Class II, BMI 35-39.9, isolated  -Counseled regarding weight reduction    DVT Prophylaxis: Lovenox  Family Communication: Wife at bedside  Code Status:  Full code  Condition:  Stable  Discharge disposition: When medically stable and is discharged back to previous home environment and work status  Time spent in minutes : 60      ELLIS,ALLISON L. ANP on 12/21/2014 at 10:52 AM  You may contact me by going to [email or web address] - password [email or web address]  I am available from 7a-7p but please confirm I am on the schedule by going to Amion as above.   After 7p please contact night coverage person covering me after hours  [facility]

## 2014-12-21 NOTE — Progress Notes (Signed)
Pt had slight temp 100.2 Tylenol 650 mg given. MD notified. Will reassess.

## 2014-12-21 NOTE — Progress Notes (Addendum)
Lactic acid 2.1- cont IVFs at 75 but also give NS bolus 500 cc x 1- repeat lactic acid in 3 hrs pending.  1243pm: PCT elevated at 2.62  203pm: Repeat lactic acid down to 1.1  Erin Hearing, ANP

## 2014-12-21 NOTE — ED Notes (Signed)
Pt reports hx cellulitis RLE, reports flu-like s/s beginning Monday, redness in R foot began last night.  Pt reports fever 102 last night, with redness in R groin.  Fever and groin redness resolved at this time.  R foot red, swollen, hot to touch. Redness extends to midcalf.

## 2014-12-21 NOTE — ED Provider Notes (Signed)
CSN: [id number]     Arrival date & time 12/21/14  [id number] History   First MD Initiated Contact with Patient 12/21/14 [id number]     Chief Complaint  Patient presents with  . Cellulitis     HPI  She presents for valuation of right leg redness and swelling. Has a history of cellulitis. Has been treated outpatient and inpatient. States his typical symptoms started 2-3 days before his cellulitis with body aches and "flu like symptoms". Some redness to his foot now progressed to his mid lower leg in a straight to his mid thigh.  No history of hypertension diabetes. No history of DVT. Has had recurrent episodes of leg swelling and saline since age 49. Seen by vascular with negative Doppler in October of this year.  Past Medical History  Diagnosis Date  . Anal fissure   . Cellulitis     recurrent cellulitis right foot-per patient-per patient in hospital for IV antibiotics 01/2012  . Edema     LE, R>L since child hood  . Hemorrhoids    Past Surgical History  Procedure Laterality Date  . Penis revascularization surgery    . Umbilical hernia repair  12/24/2007    with Proceed ventral   Family History  Problem Relation Age of Onset  . Prostate cancer Father   . Heart disease      GRANDFATHER   Social History  Substance Use Topics  . Smoking status: Never Smoker   . Smokeless tobacco: Never Used  . Alcohol Use: 3.6 oz/week    0 Standard drinks or equivalent, 6 Cans of beer per week     Comment: occasional    Review of Systems  Constitutional: Positive for fever and chills. Negative for diaphoresis, appetite change and fatigue.  HENT: Negative for mouth sores, sore throat and trouble swallowing.   Eyes: Negative for visual disturbance.  Respiratory: Negative for cough, chest tightness, shortness of breath and wheezing.   Cardiovascular: Negative for chest pain.  Gastrointestinal: Negative for nausea, vomiting, abdominal pain, diarrhea and abdominal distention.  Endocrine: Negative for  polydipsia, polyphagia and polyuria.  Genitourinary: Negative for dysuria, frequency and hematuria.  Musculoskeletal: Negative for gait problem.  Skin: Positive for color change and rash. Negative for pallor.  Neurological: Negative for dizziness, syncope, light-headedness and headaches.  Hematological: Does not bruise/bleed easily.  Psychiatric/Behavioral: Negative for behavioral problems and confusion.      Allergies  Review of patient's allergies indicates no known allergies.  Home Medications   Prior to Admission medications   Medication Sig Start Date End Date Taking? Authorizing Provider  ibuprofen (ADVIL,MOTRIN) 200 MG tablet Take 400 mg by mouth every 6 (six) hours as needed for moderate pain.   Yes Historical Provider, MD  protein supplement shake (PREMIER PROTEIN) LIQD Take 2 oz by mouth daily.   Yes Historical Provider, MD   BP 131/81 mmHg  Pulse 97  Temp(Src) 103.1 F (39.5 C) (Oral)  Resp 20  Ht 6' (1.829 m)  Wt 260 lb (117.935 kg)  BMI 35.25 kg/m2  SpO2 99% Physical Exam  Constitutional: He is oriented to person, place, and time. He appears well-developed and well-nourished. No distress.  HENT:  Head: Normocephalic.  Eyes: Conjunctivae are normal. Pupils are equal, round, and reactive to light. No scleral icterus.  Neck: Normal range of motion. Neck supple. No thyromegaly present.  Cardiovascular: Normal rate and regular rhythm.  Exam reveals no gallop and no friction rub.   No murmur heard. Pulmonary/Chest: Effort normal and  breath sounds normal. No respiratory distress. He has no wheezes. He has no rales.  Abdominal: Soft. Bowel sounds are normal. He exhibits no distension. There is no tenderness. There is no rebound.  Musculoskeletal: Normal range of motion.       Legs: Neurological: He is alert and oriented to person, place, and time.  Skin: Skin is warm and dry. No rash noted.  Psychiatric: He has a normal mood and affect. His behavior is normal.     ED Course  Procedures (including critical care time) Labs Review Labs Reviewed  CBC WITH DIFFERENTIAL/PLATELET - Abnormal; Notable for the following:    WBC 14.3 (*)    Neutro Abs 12.9 (*)    Lymphs Abs 0.6 (*)    All other components within normal limits  BASIC METABOLIC PANEL - Abnormal; Notable for the following:    Glucose, Bld 139 (*)    Creatinine, Ser 1.41 (*)    GFR calc non Af Amer 57 (*)    All other components within normal limits  CULTURE, BLOOD (ROUTINE X 2)  CULTURE, BLOOD (ROUTINE X 2)  I-STAT CG4 LACTIC ACID, ED    Imaging Review No results found. I have personally reviewed and evaluated these images and lab results as part of my medical decision-making.   EKG Interpretation None      MDM   Final diagnoses:  Cellulitis of right lower extremity  Acute kidney injury ([place])  Hyperglycemia    Appropriate leukocytosis. Mild acute kidney injury with creatinine 1.4. Baseline 0.8. Nonfasting glucose 139. Initially had temp 102. Given Motrin, and Tylenol. On recheck 103.3. Patient with chills and rigors. Not hypotensive. Blood cultures and lactate obtained. Patient states he feels worse. Complains of headache and body aches increasing leg pain. In review of his chart and in discussion with him his most recent episode he required hospitalization for failing outpatient treatment. He is followed with Dr. Maudie Mercury at [facility] primary care. I'll speak with hospitalist regarding disposition.    Tanna Furry, MD 12/21/14 [phone]

## 2014-12-21 NOTE — Progress Notes (Signed)
Pt admitted to the unit at 1652. Pt mental status is A&O x4. Pt oriented to room, staff, and call bell. Skin is intact except as otherwise charted. Full assessment charted in [facility]. Call bell within reach. Visitor guidelines reviewed w/ pt and/or family.

## 2014-12-22 DIAGNOSIS — A419 Sepsis, unspecified organism: Principal | ICD-10-CM

## 2014-12-22 DIAGNOSIS — B9689 Other specified bacterial agents as the cause of diseases classified elsewhere: Secondary | ICD-10-CM

## 2014-12-22 LAB — HEMOGLOBIN A1C
HEMOGLOBIN A1C: 5.6 % (ref 4.8–5.6)
Mean Plasma Glucose: 114 mg/dL

## 2014-12-22 LAB — COMPREHENSIVE METABOLIC PANEL
ALBUMIN: 2.6 g/dL — AB (ref 3.5–5.0)
ALK PHOS: 46 U/L (ref 38–126)
ALT: 60 U/L (ref 17–63)
ANION GAP: 7 (ref 5–15)
AST: 42 U/L — AB (ref 15–41)
BILIRUBIN TOTAL: 1.6 mg/dL — AB (ref 0.3–1.2)
BUN: 11 mg/dL (ref 6–20)
CO2: 23 mmol/L (ref 22–32)
Calcium: 7.6 mg/dL — ABNORMAL LOW (ref 8.9–10.3)
Chloride: 107 mmol/L (ref 101–111)
Creatinine, Ser: 1.5 mg/dL — ABNORMAL HIGH (ref 0.61–1.24)
GFR calc Af Amer: >60 mL/min (ref >60)
GFR calc non Af Amer: 53 mL/min — ABNORMAL LOW (ref >60)
GLUCOSE: 137 mg/dL — AB (ref 65–99)
POTASSIUM: 3.3 mmol/L — AB (ref 3.5–5.1)
SODIUM: 137 mmol/L (ref 135–145)
Total Protein: 5.3 g/dL — ABNORMAL LOW (ref 6.5–8.1)

## 2014-12-22 LAB — CBC
HEMATOCRIT: 36.6 % — AB (ref 39.0–52.0)
HEMOGLOBIN: 12.4 g/dL — AB (ref 13.0–17.0)
MCH: 28.5 pg (ref 26.0–34.0)
MCHC: 33.9 g/dL (ref 30.0–36.0)
MCV: 84.1 fL (ref 78.0–100.0)
Platelets: 123 10*3/uL — ABNORMAL LOW (ref 150–400)
RBC: 4.35 MIL/uL (ref 4.22–5.81)
RDW: 13.7 % (ref 11.5–15.5)
WBC: 8.7 10*3/uL (ref 4.0–10.5)

## 2014-12-22 LAB — LACTIC ACID, PLASMA
Lactic Acid, Venous: 2.8 mmol/L (ref 0.5–2.0)
Lactic Acid, Venous: 1 mmol/L (ref 0.5–2.0)
Lactic Acid, Venous: 1.7 mmol/L (ref 0.5–2.0)
Lactic Acid, Venous: 0.9 mmol/L (ref 0.5–2.0)

## 2014-12-22 LAB — HIV ANTIBODY (ROUTINE TESTING W REFLEX): HIV Screen 4th Generation wRfx: NONREACTIVE

## 2014-12-22 MED ORDER — ENOXAPARIN SODIUM 60 MG/0.6ML ~~LOC~~ SOLN
55.0000 mg | SUBCUTANEOUS | Status: DC
  Administered 2014-12-22 – 2014-12-27 (×6): 55 mg via SUBCUTANEOUS
  Filled 2014-12-22 (×6): qty 0.6

## 2014-12-22 MED ORDER — SODIUM CHLORIDE 0.9 % IV BOLUS (SEPSIS)
500.0000 mL | Freq: Once | INTRAVENOUS | Status: AC
  Administered 2014-12-22: 500 mL via INTRAVENOUS

## 2014-12-22 MED ORDER — SODIUM CHLORIDE 0.9 % IV BOLUS (SEPSIS): 500.0000 mL | Freq: Once | INTRAVENOUS | Status: DC

## 2014-12-22 MED ORDER — SODIUM CHLORIDE 0.9 % IV BOLUS (SEPSIS)
500.0000 mL | Freq: Once | INTRAVENOUS | Status: AC
Start: 2014-12-22 — End: 2014-12-22
  Administered 2014-12-22: 500 mL via INTRAVENOUS

## 2014-12-22 MED ORDER — ACETAMINOPHEN 500 MG PO TABS
1000.0000 mg | ORAL_TABLET | Freq: Once | ORAL | Status: AC
  Administered 2014-12-22: 1000 mg via ORAL
  Filled 2014-12-22: qty 2

## 2014-12-22 MED ORDER — IBUPROFEN 400 MG PO TABS
400.0000 mg | ORAL_TABLET | Freq: Once | ORAL | Status: AC
  Administered 2014-12-22: 400 mg via ORAL
  Filled 2014-12-22: qty 1

## 2014-12-22 MED ORDER — VANCOMYCIN HCL IN DEXTROSE 750-5 MG/150ML-% IV SOLN
750.0000 mg | Freq: Two times a day (BID) | INTRAVENOUS | Status: DC
  Administered 2014-12-22 – 2014-12-23 (×2): 750 mg via INTRAVENOUS
  Filled 2014-12-22 (×3): qty 150

## 2014-12-22 NOTE — Progress Notes (Signed)
PATIENT DETAILS Name: Benjamin Mcdowell Age: 49 y.o. Sex: male Date of Birth: 07/18/1965 Admit Date: 12/21/2014 Admitting Physician Elmarie Shiley, MD PCP:[name], Nickola Major., DO  Subjective: Fever better-but has developed darkish coloration to the skin of his right foot (see pic below)  Assessment/Plan: Principal Problem: Sepsis: Sepsis pathophysiology much improved with IV antibiotics. Secondary to cellulitis of right lower extremity. Continue antibiotics, follow cultures.  Active Problems: Cellulitis of right lower extremity: Overnight has developed black discoloration/blistering of the skin of his right foot. Not sure if this is ongoing tissue/skin inflammation from cellulitis or a vasculitis/allergic reaction to antibiotics. Erythematous area in the right lower extremity has actually improved-I highly doubt deep tissue infection. ID consulted, recommendations are to continue with IV vancomycin and Zosyn. Follow clinical course. Await right lower extremity venous Doppler.  ARF: Prerenal azotemia secondary to sepsis. Resolved.  Obesity, Class II, BMI 35-39.9: Counseled regarding importance of weight loss  Mild hyperglycemia-A1c 5.6. Monitor for now  Disposition: Remain inpatient  Antimicrobial agents  See below  Anti-infectives    Start     Dose/Rate Route Frequency Ordered Stop   12/22/14 1330  vancomycin (VANCOCIN) IVPB 750 mg/150 ml premix     750 mg 150 mL/hr over 60 Minutes Intravenous Every 12 hours 12/22/14 1254     12/21/14 2200  vancomycin (VANCOCIN) IVPB 750 mg/150 ml premix  Status:  Discontinued     750 mg 150 mL/hr over 60 Minutes Intravenous Every 12 hours 12/21/14 1046 12/22/14 1254   12/21/14 1700  piperacillin-tazobactam (ZOSYN) IVPB 3.375 g     3.375 g 12.5 mL/hr over 240 Minutes Intravenous 3 times per day 12/21/14 1059     12/21/14 1045  piperacillin-tazobactam (ZOSYN) IVPB 3.375 g     3.375 g 100 mL/hr over 30 Minutes Intravenous  Once  12/21/14 1041 12/21/14 1334   12/21/14 1045  vancomycin (VANCOCIN) IVPB 1000 mg/200 mL premix  Status:  Discontinued     1,000 mg 200 mL/hr over 60 Minutes Intravenous  Once 12/21/14 1041 12/21/14 1043   12/21/14 0930  vancomycin (VANCOCIN) 2,500 mg in sodium chloride 0.9 % 500 mL IVPB     2,500 mg 250 mL/hr over 120 Minutes Intravenous  Once 12/21/14 0849 12/21/14 1240      DVT Prophylaxis: Prophylactic Lovenox  Code Status: Full code   Family Communication None at bedside  Procedures: None  CONSULTS:  ID  Time spent 30 minutes-Greater than 50% of this time was spent in counseling, explanation of diagnosis, planning of further management, and coordination of care.  MEDICATIONS: Scheduled Meds: . enoxaparin (LOVENOX) injection  55 mg Subcutaneous Q24H  . ondansetron (ZOFRAN) IV  4 mg Intravenous Once  . piperacillin-tazobactam (ZOSYN)  IV  3.375 g Intravenous 3 times per day  . sodium chloride  500 mL Intravenous Once  . vancomycin  750 mg Intravenous Q12H   Continuous Infusions: . sodium chloride 125 mL/hr at 12/21/14 2234   PRN Meds:.acetaminophen **OR** acetaminophen, morphine injection, oxyCODONE    PHYSICAL EXAM: Vital signs in last 24 hours: Filed Vitals:   12/22/14 0436 12/22/14 0448 12/22/14 0619 12/22/14 1418  BP:  120/56 94/66 134/89  Pulse:  109 86 99  Temp: 100.7 F (38.2 C) 102.9 F (39.4 C) 98.6 F (37 C) 100.6 F (38.1 C)  TempSrc: Oral Oral Oral Oral  Resp:  18 18 19   Height:      Weight:  SpO2:  92% 95% 98%    Weight change:  Filed Weights   12/21/14 0819 12/21/14 1704  Weight: 117.935 kg (260 lb) 117.935 kg (260 lb)   Body mass index is 35.25 kg/(m^2).   Gen Exam: Awake and alert with clear speech.  Neck: Supple, No JVD.   Chest: B/L Clear.   CVS: S1 S2 Regular, no murmurs.  Abdomen: soft, BS +, non tender, non distended.  Extremities: see below pic Neurologic: Non Focal.   Skin:   Wounds: N/A.    Intake/Output from  previous day:  Intake/Output Summary (Last 24 hours) at 12/22/14 1511 Last data filed at 12/22/14 0901  Gross per 24 hour  Intake   3790 ml  Output   2900 ml  Net    890 ml     LAB RESULTS: CBC  Recent Labs Lab 12/21/14 0900 12/22/14 0448  WBC 14.3* 8.7  HGB 14.7 12.4*  HCT 43.1 36.6*  PLT 169 123*  MCV 84.2 84.1  MCH 28.7 28.5  MCHC 34.1 33.9  RDW 13.7 13.7  LYMPHSABS 0.6*  --   MONOABS 0.8  --   EOSABS 0.0  --   BASOSABS 0.0  --     Chemistries   Recent Labs Lab 12/21/14 0900 12/22/14 0448  NA 137 137  K 4.3 3.3*  CL 102 107  CO2 27 23  GLUCOSE 139* 137*  BUN 19 11  CREATININE 1.41* 1.50*  CALCIUM 8.9 7.6*    CBG: No results for input(s): GLUCAP in the last 168 hours.  GFR Estimated Creatinine Clearance: 79 mL/min (by C-G formula based on Cr of 1.5).  Coagulation profile No results for input(s): INR, PROTIME in the last 168 hours.  Cardiac Enzymes No results for input(s): CKMB, TROPONINI, MYOGLOBIN in the last 168 hours.  Invalid input(s): CK  Invalid input(s): POCBNP No results for input(s): DDIMER in the last 72 hours.  Recent Labs  12/21/14 1040  HGBA1C 5.6   No results for input(s): CHOL, HDL, LDLCALC, TRIG, CHOLHDL, LDLDIRECT in the last 72 hours. No results for input(s): TSH, T4TOTAL, T3FREE, THYROIDAB in the last 72 hours.  Invalid input(s): FREET3 No results for input(s): VITAMINB12, FOLATE, FERRITIN, TIBC, IRON, RETICCTPCT in the last 72 hours. No results for input(s): LIPASE, AMYLASE in the last 72 hours.  Urine Studies No results for input(s): UHGB, CRYS in the last 72 hours.  Invalid input(s): UACOL, UAPR, USPG, UPH, UTP, UGL, UKET, UBIL, UNIT, UROB, ULEU, UEPI, UWBC, URBC, UBAC, CAST, UCOM, BILUA  MICROBIOLOGY: Recent Results (from the past 240 hour(s))  Culture, blood (Routine X 2) w Reflex to ID Panel     Status: None (Preliminary result)   Collection Time: 12/21/14  9:00 AM  Result Value Ref Range Status    Specimen Description BLOOD LEFT ANTECUBITAL  Final   Special Requests BOTTLES DRAWN AEROBIC AND ANAEROBIC 5MLS  Final   Culture NO GROWTH 1 DAY  Final   Report Status PENDING  Incomplete  Culture, blood (Routine X 2) w Reflex to ID Panel     Status: None (Preliminary result)   Collection Time: 12/21/14 10:24 AM  Result Value Ref Range Status   Specimen Description BLOOD RIGHT HAND  Final   Special Requests BOTTLES DRAWN AEROBIC AND ANAEROBIC 5CCS  Final   Culture NO GROWTH 1 DAY  Final   Report Status PENDING  Incomplete    RADIOLOGY STUDIES/RESULTS: Dg Chest 2 View  12/21/2014  CLINICAL DATA:  Cellulitis RIGHT lower leg,  fever 103 degrees, cough EXAM: CHEST  2 VIEW COMPARISON:  None FINDINGS: Normal heart size, mediastinal contours, and pulmonary vascularity. Mild peribronchial thickening. No pulmonary infiltrate, pleural effusion, or pneumothorax. RIGHT glenohumeral degenerative changes. Bones otherwise unremarkable. IMPRESSION: Mild bronchitic changes without infiltrate. Electronically Signed   By: Lavonia Dana M.D.   On: 12/21/2014 12:31    Oren Binet, MD  [facility] Pager:[phone]  If 7PM-7AM, please contact night-coverage [email or web address] Password [email or web address] 12/22/2014, 3:11 PM   LOS: 1 day

## 2014-12-22 NOTE — Care Management Note (Signed)
Case Management Note  Patient Details  Name: Benjamin Mcdowell MRN: [id number] Date of Birth: 22-Jul-1965  Subjective/Objective:     Patient lives with spouse, PCP is Dr. Colin Benton, has cellulitis of rle, conts with fevers.  NCM will cont to follow for dc needs.               Action/Plan:   Expected Discharge Date:                  Expected Discharge Plan:  Home/Self Care  In-House Referral:     Discharge planning Services  CM Consult  Post Acute Care Choice:    Choice offered to:     DME Arranged:    DME Agency:     HH Arranged:    HH Agency:     Status of Service:  In process, will continue to follow  Medicare Important Message Given:    Date Medicare IM Given:    Medicare IM give by:    Date Additional Medicare IM Given:    Additional Medicare Important Message give by:     If discussed at [place] of Stay Meetings, dates discussed:    Additional Comments:  Zenon Mayo, RN 12/22/2014, 6:33 PM

## 2014-12-22 NOTE — Consult Note (Signed)
Infectious Disease Consult  Date: 12/22/2014               Patient Name:  Benjamin Mcdowell MRN: [id number]  DOB: 1965-02-25 Age / Sex: 49 y.o., male   PCP: Lucretia Kern, DO         Requesting Physician: Dr. Jonetta Osgood, MD    Consulting Reason:  RLE cellulitis     Chief Complaint: RLE cellulitis  History of Present Illness: [name] is a 50 year old male with PMH of chronic right lower extremity edema with recurrent cellulitis and Obesity who presents with RLE swelling, rash, and discoloration. His symptoms began on Monday with a prodrome of fever, chills, diaphoresis, and myalgias. His right foot began to swell on Wednesday, became erythematous, and he noticed some streaking up his legs to his inner thigh. He came to the ED on Thursday. He was admitted for RLE cellulitis and initially started on IV Vancomycin and Zosyn. He did not recall any injury or trauma to the area. He now has purplish/blue bumps around his ankle which he has not had in the past.   He states this has been an ongoing problem for him since the age of 75 when he cut his toenail too deep. He has had recurrent on and off swelling of his right lower extremity since then, which has usually been treated on an outpatient basis with oral antibiotics. Roughly 6-7 total episodes. Two years ago, he had to be hospitalized for cellulitis in the foot as well. 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 He thinks he had a spider bite several years ago. He is not sure if he had the flu shot this year. Denies any known allergy to medication. He has been told use compression stockings, but admits he has not been using them as he should.  Meds: Current Facility-Administered Medications  Medication Dose Route Frequency Provider Last Rate Last Dose  . 0.9 %  sodium chloride infusion   Intravenous Continuous Elmarie Shiley, MD 125 mL/hr at 12/21/14 2234    .  acetaminophen (TYLENOL) tablet 650 mg  650 mg Oral Q6H PRN Samella Parr, NP   650 mg at 12/21/14 2337   Or  . acetaminophen (TYLENOL) suppository 650 mg  650 mg Rectal Q6H PRN Samella Parr, NP      . enoxaparin (LOVENOX) injection 55 mg  55 mg Subcutaneous Q24H Jonetta Osgood, MD      . morphine 2 MG/ML injection 1-2 mg  1-2 mg Intravenous Q2H PRN Samella Parr, NP   2 mg at 12/21/14 2018  . ondansetron (ZOFRAN) injection 4 mg  4 mg Intravenous Once Tanna Furry, MD      . oxyCODONE (Oxy IR/ROXICODONE) immediate release tablet 5 mg  5 mg Oral Q4H PRN Samella Parr, NP   5 mg at 12/22/14 0919  . piperacillin-tazobactam (ZOSYN) IVPB 3.375 g  3.375 g Intravenous 3 times per day Darl Pikes, RPH   3.375 g at 12/22/14 [id number]  . sodium chloride 0.9 % bolus 500 mL  500 mL Intravenous Once Jeryl Columbia, NP   Stopped at 12/22/14 [phone]  . vancomycin (VANCOCIN) IVPB 750 mg/150 ml premix  750 mg Intravenous Q12H Darl Pikes, [facility]   Stopped at 12/22/14 1000    Allergies: Allergies as of 12/21/2014  . (No Known Allergies)  Past Medical History  Diagnosis Date  . Anal fissure     "lanced it w/my hemorrhoids"  . Cellulitis     recurrent cellulitis right foot-per patient-per patient in hospital for IV antibiotics 01/2012  . Edema     LE, R>L since child hood  . Hemorrhoids   . Cancer of skin of back     "don't know which kind"  . Cellulitis of right lower extremity hospitalized 12/21/2014   Past Surgical History  Procedure Laterality Date  . Penis revascularization surgery    . Umbilical hernia repair  12/24/2007    with Proceed ventral  . Hernia repair    . Hemorrhoid surgery  1985    "had them lanced; also lanced anal fissure"   Family History  Problem Relation Age of Onset  . Prostate cancer Father   . Heart disease      GRANDFATHER   Social History   Social History  . Marital Status: Married    Spouse Name: N/A  . Number of Children: N/A  . Years of  Education: N/A   Occupational History  . Not on file.   Social History Main Topics  . Smoking status: Never Smoker   . Smokeless tobacco: Never Used  . Alcohol Use: 3.6 oz/week    6 Cans of beer, 0 Standard drinks or equivalent per week  . Drug Use: Yes    Special: Marijuana     Comment: 12/21/2014 "couple times q 3-4 months"  . Sexual Activity: Yes   Other Topics Concern  . Not on file   Social History Narrative   Work or School: Insurance account manager Situation: lives with wife and 58 yo twins in 2016      Spiritual Beliefs: non practicing Christian      Lifestyle: started exercise and diet changes in summer 2016          Review of Systems: Review of Systems  Constitutional: Positive for fever, chills and diaphoresis.  Respiratory: Negative for cough, sputum production and shortness of breath.   Cardiovascular: Positive for leg swelling. Negative for chest pain and palpitations.  Gastrointestinal: Positive for constipation. Negative for nausea, vomiting, abdominal pain, diarrhea and blood in stool.  Genitourinary: Negative for dysuria and hematuria.  Musculoskeletal: Positive for myalgias. Negative for joint pain.  Skin: Positive for rash.  Neurological: Positive for headaches. Negative for dizziness.     Physical Exam: Blood pressure 94/66, pulse 86, temperature 98.6 F (37 C), temperature source Oral, resp. rate 18, height 6' (1.829 m), weight 260 lb (117.935 kg), SpO2 95 %. Physical Exam  Constitutional: He is oriented to person, place, and time. He appears well-developed and well-nourished. No distress.  HENT:  Head: Normocephalic and atraumatic.  Mouth/Throat: Oropharynx is clear and moist.  Cardiovascular: Normal rate and regular rhythm.   Pulses:      Dorsalis pedis pulses are 2+ on the left side.  Right DP pulse not palpable due to edema.  Pulmonary/Chest: Effort normal. He has no wheezes. He has no rales. He exhibits no tenderness.  Abdominal:  Soft. Bowel sounds are normal. There is no tenderness.  Musculoskeletal: Normal range of motion. He exhibits edema and tenderness.  Neurological: He is alert and oriented to person, place, and time. No sensory deficit.  Skin: Skin is warm. Rash noted. There is erythema.  RLE extremity edematous from foot to half-way up the calf with erythema and purple intact blisters around the ankle and heel.  It is warm and tender to touch. Toenails clean, well-kept. No discharge or lacerations noted.  Vitals reviewed. there is no skin breakdown on toes.    Lab results: Basic Metabolic Panel:  Recent Labs  12/21/14 0900 12/22/14 0448  NA 137 137  K 4.3 3.3*  CL 102 107  CO2 27 23  GLUCOSE 139* 137*  BUN 19 11  CREATININE 1.41* 1.50*  CALCIUM 8.9 7.6*   Liver Function Tests:  Recent Labs  12/22/14 0448  AST 42*  ALT 60  ALKPHOS 46  BILITOT 1.6*  PROT 5.3*  ALBUMIN 2.6*   No results for input(s): LIPASE, AMYLASE in the last 72 hours. No results for input(s): AMMONIA in the last 72 hours. CBC:  Recent Labs  12/21/14 0900 12/22/14 0448  WBC 14.3* 8.7  NEUTROABS 12.9*  --   HGB 14.7 12.4*  HCT 43.1 36.6*  MCV 84.2 84.1  PLT 169 123*   Cardiac Enzymes: No results for input(s): CKTOTAL, CKMB, CKMBINDEX, TROPONINI in the last 72 hours. BNP: No results for input(s): PROBNP in the last 72 hours. D-Dimer: No results for input(s): DDIMER in the last 72 hours. CBG: No results for input(s): GLUCAP in the last 72 hours. Hemoglobin A1C:  Recent Labs  12/21/14 1040  HGBA1C 5.6   Fasting Lipid Panel: No results for input(s): CHOL, HDL, LDLCALC, TRIG, CHOLHDL, LDLDIRECT in the last 72 hours. Thyroid Function Tests: No results for input(s): TSH, T4TOTAL, FREET4, T3FREE, THYROIDAB in the last 72 hours. Anemia Panel: No results for input(s): VITAMINB12, FOLATE, FERRITIN, TIBC, IRON, RETICCTPCT in the last 72 hours. Coagulation: No results for input(s): LABPROT, INR in the last 72  hours. Urine Drug Screen: Drugs of Abuse  No results found for: LABOPIA, COCAINSCRNUR, LABBENZ, AMPHETMU, THCU, LABBARB  Alcohol Level: No results for input(s): ETH in the last 72 hours. Urinalysis:  Recent Labs  12/21/14 1420  COLORURINE YELLOW  LABSPEC 1.014  PHURINE 6.0  GLUCOSEU 100*  HGBUR NEGATIVE  BILIRUBINUR NEGATIVE  KETONESUR NEGATIVE  PROTEINUR NEGATIVE  NITRITE NEGATIVE  LEUKOCYTESUR NEGATIVE    Imaging results:  Dg Chest 2 View  12/21/2014  CLINICAL DATA:  Cellulitis RIGHT lower leg, fever 103 degrees, cough EXAM: CHEST  2 VIEW COMPARISON:  None FINDINGS: Normal heart size, mediastinal contours, and pulmonary vascularity. Mild peribronchial thickening. No pulmonary infiltrate, pleural effusion, or pneumothorax. RIGHT glenohumeral degenerative changes. Bones otherwise unremarkable. IMPRESSION: Mild bronchitic changes without infiltrate. Electronically Signed   By: Lavonia Dana M.D.   On: 12/21/2014 12:31     Assessment, Plan, & Recommendations by Problem: Principal Problem:   Cellulitis of right lower extremity Active Problems:   CHRONIC Edema of right lower extremity   Acute renal failure (ARF) (HCC)   SIRS with acute organ dysfunction due to infectious process (HCC)   Acute hyperglycemia   Obesity, Class II, BMI 35-39.9, isolated (HCC)   Cellulitis of right leg  Cellulitis of RLE: Swelling and erythema no longer present at marked site of inner right thigh and lower than border drawn at the calf. He has dark purplish intact blisters on around his right ankle and heel which are apparently new. Possibly inflammatory reaction to antibiotics.  Fevers of 102-103 overnight which has resolved. Initial leukocytosis of 14.3 improved to 8.7 now. -Continue Vancomycin and Zosyn -Elevate leg -f/u blood cultures -f/u vascular U/S -Appreciate consult  SIRS: Improved WBC to 8.7, temp 98.6 now after overnight fevers, Lactic acid improved from 3.4 to 1.0. -Continue current  management  per primary team  ARF: SCr 1.5, likely secondary to infectious process or decreased oral intake -hydration per primary team    Signed: Zada Finders, MD 12/22/2014, 12:19 PM   Pt seen and examined with Dr Posey Pronto.  His sepsis has resolved.  I suspect he will have ongoing tissue inflammation while his cellulitis is being treated.  Would continue his current anbx.  Keep leg elevated Doppler of RLE Consider compression stockings once past acute stage. Consider adding clinda if he has worsening.  HIV (-)

## 2014-12-22 NOTE — Progress Notes (Signed)
Pharmacy Antibiotic Follow-up Note  Benjamin Mcdowell is a 49 y.o. year-old male admitted on 12/21/2014. The patient is currently on day 2 of Zosyn and Vancomycin for recurrent R lower extremity cellulitis. WBC, LA and fever curve improved since admission. This morning he developed dark purple intact blisters on R leg; with concern that this might an inflammatory reaction secondary to antibiotics am dose of vancomycin was held. ID consulted and given blessing to continue antibiotics. RN made aware and vancomycin rescheduled to avoid any confusion.  Assessment/Plan: 1. Continue vancomycin 750 mg IV Q 12 hours 2. Will obtain level at steady state if continued 3. Continue EI Zosyn 3.375 gram Q 8 hours 4. Monitor renal fxn closely and adjust abx's as needed; renal fxn remains elevated from 01/2013 labs despite fluid resuscitation and good UOP   5. Following daily; notes prn   Temp (24hrs), Avg:101.8 F (38.8 C), Min:98.6 F (37 C), Max:103.1 F (39.5 C)   Recent Labs Lab 12/21/14 0900 12/22/14 0448  WBC 14.3* 8.7    Recent Labs Lab 12/21/14 0900 12/22/14 0448  CREATININE 1.41* 1.50*  SCr 0.88 on 01/07/2013  No Known Allergies  Antimicrobials this admission: 12/15 Zosyn >>  12/15 Vancomycin >>   Levels/dose changes this admission: None to date  Microbiology results: 12/15 BCx: ngtd   Thank you for allowing pharmacy to be a part of this patient's care.  Vincenza Hews, PharmD, BCPS 12/22/2014, 1:00 PM Pager: [phone]

## 2014-12-22 NOTE — Progress Notes (Signed)
Pt. Temperature 101.6. MD notified. Telephone order to give 650 mg of tylenol.

## 2014-12-23 ENCOUNTER — Inpatient Hospital Stay (HOSPITAL_COMMUNITY): Payer: Managed Care, Other (non HMO)

## 2014-12-23 DIAGNOSIS — R609 Edema, unspecified: Secondary | ICD-10-CM

## 2014-12-23 LAB — CBC
HEMATOCRIT: 38 % — AB (ref 39.0–52.0)
HEMOGLOBIN: 12.5 g/dL — AB (ref 13.0–17.0)
MCH: 27.7 pg (ref 26.0–34.0)
MCHC: 32.9 g/dL (ref 30.0–36.0)
MCV: 84.3 fL (ref 78.0–100.0)
Platelets: 122 10*3/uL — ABNORMAL LOW (ref 150–400)
RBC: 4.51 MIL/uL (ref 4.22–5.81)
RDW: 13.7 % (ref 11.5–15.5)
WBC: 8 10*3/uL (ref 4.0–10.5)

## 2014-12-23 LAB — COMPREHENSIVE METABOLIC PANEL
ALBUMIN: 2.5 g/dL — AB (ref 3.5–5.0)
ALK PHOS: 53 U/L (ref 38–126)
ALT: 56 U/L (ref 17–63)
ANION GAP: 10 (ref 5–15)
AST: 32 U/L (ref 15–41)
BILIRUBIN TOTAL: 1.1 mg/dL (ref 0.3–1.2)
BUN: 8 mg/dL (ref 6–20)
CALCIUM: 8.1 mg/dL — AB (ref 8.9–10.3)
CO2: 24 mmol/L (ref 22–32)
Chloride: 104 mmol/L (ref 101–111)
Creatinine, Ser: 1.37 mg/dL — ABNORMAL HIGH (ref 0.61–1.24)
GFR calc Af Amer: >60 mL/min (ref >60)
GFR, EST NON AFRICAN AMERICAN: 59 mL/min — AB (ref >60)
GLUCOSE: 110 mg/dL — AB (ref 65–99)
Potassium: 3.2 mmol/L — ABNORMAL LOW (ref 3.5–5.1)
Sodium: 138 mmol/L (ref 135–145)
TOTAL PROTEIN: 5.9 g/dL — AB (ref 6.5–8.1)

## 2014-12-23 LAB — PROCALCITONIN: PROCALCITONIN: 2.25 ng/mL

## 2014-12-23 MED ORDER — SODIUM CHLORIDE 0.9 % IV SOLN
600.0000 mg | Freq: Two times a day (BID) | INTRAVENOUS | Status: DC
  Administered 2014-12-23 – 2014-12-26 (×7): 600 mg via INTRAVENOUS
  Filled 2014-12-23 (×10): qty 600

## 2014-12-23 MED ORDER — POTASSIUM CHLORIDE CRYS ER 20 MEQ PO TBCR
40.0000 meq | EXTENDED_RELEASE_TABLET | Freq: Once | ORAL | Status: AC
  Administered 2014-12-23: 40 meq via ORAL
  Filled 2014-12-23: qty 2

## 2014-12-23 NOTE — Progress Notes (Signed)
PATIENT DETAILS Name: Benjamin Mcdowell Age: 49 y.o. Sex: male Date of Birth: 04-24-1965 Admit Date: 12/21/2014 Admitting Physician Elmarie Shiley, MD PCP:[name], Nickola Major., DO  Subjective: Persistently febrile last night-more blackish discoloration of skin in the right foot-some more blistering  Assessment/Plan: Principal Problem: Sepsis: Persistently febrile-but leukocytosis and erythematous area in the right leg actually better. Is fever from drug fever?After d/w ID-Dr Snider-will change from Vanco/Zosyn to Teflaro.Blood cultures negative  Active Problems: Cellulitis of right lower extremity: Overnight  black discoloration/blistering of the skin of his right foot has worsened-not sure if this is related to skin/soft tissue infection or drug induced vasculitis (as persistently febrile). Abx changed to Teflaro. Although doubt deep infection-will get a MRI to make sure. Doppler neg for DVT.  ARF: Prerenal azotemia secondary to sepsis. Resolving with IVF.  Obesity, Class II, BMI 35-39.9: Counseled regarding importance of weight loss  Mild hyperglycemia-A1c 5.6. Monitor for now  Disposition: Remain inpatient  Antimicrobial agents  See below  Anti-infectives    Start     Dose/Rate Route Frequency Ordered Stop   12/23/14 1100  ceftaroline (TEFLARO) 600 mg in sodium chloride 0.9 % 250 mL IVPB     600 mg 250 mL/hr over 60 Minutes Intravenous Every 12 hours 12/23/14 1000     12/22/14 1330  vancomycin (VANCOCIN) IVPB 750 mg/150 ml premix  Status:  Discontinued     750 mg 150 mL/hr over 60 Minutes Intravenous Every 12 hours 12/22/14 1254 12/23/14 1000   12/21/14 2200  vancomycin (VANCOCIN) IVPB 750 mg/150 ml premix  Status:  Discontinued     750 mg 150 mL/hr over 60 Minutes Intravenous Every 12 hours 12/21/14 1046 12/22/14 1254   12/21/14 1700  piperacillin-tazobactam (ZOSYN) IVPB 3.375 g  Status:  Discontinued     3.375 g 12.5 mL/hr over 240 Minutes Intravenous 3  times per day 12/21/14 1059 12/23/14 1000   12/21/14 1045  piperacillin-tazobactam (ZOSYN) IVPB 3.375 g     3.375 g 100 mL/hr over 30 Minutes Intravenous  Once 12/21/14 1041 12/21/14 1334   12/21/14 1045  vancomycin (VANCOCIN) IVPB 1000 mg/200 mL premix  Status:  Discontinued     1,000 mg 200 mL/hr over 60 Minutes Intravenous  Once 12/21/14 1041 12/21/14 1043   12/21/14 0930  vancomycin (VANCOCIN) 2,500 mg in sodium chloride 0.9 % 500 mL IVPB     2,500 mg 250 mL/hr over 120 Minutes Intravenous  Once 12/21/14 0849 12/21/14 1240      DVT Prophylaxis: Prophylactic Lovenox  Code Status: Full code   Family Communication None at bedside  Procedures: None  CONSULTS:  ID  Time spent 30 minutes-Greater than 50% of this time was spent in counseling, explanation of diagnosis, planning of further management, and coordination of care.  MEDICATIONS: Scheduled Meds: . ceFTAROline (TEFLARO) IV  600 mg Intravenous Q12H  . enoxaparin (LOVENOX) injection  55 mg Subcutaneous Q24H  . ondansetron (ZOFRAN) IV  4 mg Intravenous Once  . sodium chloride  500 mL Intravenous Once   Continuous Infusions: . sodium chloride 125 mL/hr at 12/23/14 0957   PRN Meds:.acetaminophen **OR** acetaminophen, morphine injection, oxyCODONE    PHYSICAL EXAM: Vital signs in last 24 hours: Filed Vitals:   12/23/14 0222 12/23/14 0437 12/23/14 0711 12/23/14 0942  BP: 135/83 111/74    Pulse: 96 91    Temp: 102.9 F (39.4 C) 101.1 F (38.4 C) 102.3 F (39.1  C) 99.8 F (37.7 C)  TempSrc: Oral Oral Oral Oral  Resp: 20 20    Height:      Weight:      SpO2: 92% 97%      Weight change:  Filed Weights   12/21/14 0819 12/21/14 1704  Weight: 117.935 kg (260 lb) 117.935 kg (260 lb)   Body mass index is 35.25 kg/(m^2).   Gen Exam: Awake and alert with clear speech. Looks flushed Neck: Supple, No JVD.   Chest: B/L Clear.   CVS: S1 S2 Regular, no murmurs.  Abdomen: soft, BS +, non tender, non distended.    Extremities: Right lower (see below pic-taken today)        Neuro:non focal  Intake/Output from previous day:  Intake/Output Summary (Last 24 hours) at 12/23/14 1353 Last data filed at 12/23/14 0916  Gross per 24 hour  Intake 1740.33 ml  Output   3050 ml  Net -1309.67 ml     LAB RESULTS: CBC  Recent Labs Lab 12/21/14 0900 12/22/14 0448 12/23/14 0530  WBC 14.3* 8.7 8.0  HGB 14.7 12.4* 12.5*  HCT 43.1 36.6* 38.0*  PLT 169 123* 122*  MCV 84.2 84.1 84.3  MCH 28.7 28.5 27.7  MCHC 34.1 33.9 32.9  RDW 13.7 13.7 13.7  LYMPHSABS 0.6*  --   --   MONOABS 0.8  --   --   EOSABS 0.0  --   --   BASOSABS 0.0  --   --     Chemistries   Recent Labs Lab 12/21/14 0900 12/22/14 0448 12/23/14 0530  NA 137 137 138  K 4.3 3.3* 3.2*  CL 102 107 104  CO2 27 23 24   GLUCOSE 139* 137* 110*  BUN 19 11 8   CREATININE 1.41* 1.50* 1.37*  CALCIUM 8.9 7.6* 8.1*    CBG: No results for input(s): GLUCAP in the last 168 hours.  GFR Estimated Creatinine Clearance: 86.4 mL/min (by C-G formula based on Cr of 1.37).  Coagulation profile No results for input(s): INR, PROTIME in the last 168 hours.  Cardiac Enzymes No results for input(s): CKMB, TROPONINI, MYOGLOBIN in the last 168 hours.  Invalid input(s): CK  Invalid input(s): POCBNP No results for input(s): DDIMER in the last 72 hours.  Recent Labs  12/21/14 1040  HGBA1C 5.6   No results for input(s): CHOL, HDL, LDLCALC, TRIG, CHOLHDL, LDLDIRECT in the last 72 hours. No results for input(s): TSH, T4TOTAL, T3FREE, THYROIDAB in the last 72 hours.  Invalid input(s): FREET3 No results for input(s): VITAMINB12, FOLATE, FERRITIN, TIBC, IRON, RETICCTPCT in the last 72 hours. No results for input(s): LIPASE, AMYLASE in the last 72 hours.  Urine Studies No results for input(s): UHGB, CRYS in the last 72 hours.  Invalid input(s): UACOL, UAPR, USPG, UPH, UTP, UGL, UKET, UBIL, UNIT, UROB, ULEU, UEPI, UWBC, URBC, UBAC, CAST,  UCOM, BILUA  MICROBIOLOGY: Recent Results (from the past 240 hour(s))  Culture, blood (Routine X 2) w Reflex to ID Panel     Status: None (Preliminary result)   Collection Time: 12/21/14  9:00 AM  Result Value Ref Range Status   Specimen Description BLOOD LEFT ANTECUBITAL  Final   Special Requests BOTTLES DRAWN AEROBIC AND ANAEROBIC 5MLS  Final   Culture NO GROWTH 2 DAYS  Final   Report Status PENDING  Incomplete  Culture, blood (Routine X 2) w Reflex to ID Panel     Status: None (Preliminary result)   Collection Time: 12/21/14 10:24 AM  Result Value  Ref Range Status   Specimen Description BLOOD RIGHT HAND  Final   Special Requests BOTTLES DRAWN AEROBIC AND ANAEROBIC 5CCS  Final   Culture NO GROWTH 2 DAYS  Final   Report Status PENDING  Incomplete    RADIOLOGY STUDIES/RESULTS: Dg Chest 2 View  12/21/2014  CLINICAL DATA:  Cellulitis RIGHT lower leg, fever 103 degrees, cough EXAM: CHEST  2 VIEW COMPARISON:  None FINDINGS: Normal heart size, mediastinal contours, and pulmonary vascularity. Mild peribronchial thickening. No pulmonary infiltrate, pleural effusion, or pneumothorax. RIGHT glenohumeral degenerative changes. Bones otherwise unremarkable. IMPRESSION: Mild bronchitic changes without infiltrate. Electronically Signed   By: Lavonia Dana M.D.   On: 12/21/2014 12:31    Oren Binet, MD  [facility] Pager:[phone]  If 7PM-7AM, please contact night-coverage [email or web address] Password [email or web address] 12/23/2014, 1:53 PM   LOS: 2 days

## 2014-12-23 NOTE — Progress Notes (Signed)
    [facility] for Infect[facility] Disease    Date of Admission:  12/21/2014   Total days of antibiotics 2        Day 2 vanco/piptazo        Day 1 ceftaroline           ID: Benjamin Mcdowell is a 49 y.o. male with right leg cellulitis, presented with SIRS still having high fevers though leukocytosis improved. Erythema on right leg improved though more echymosis and blistering localized to dorsum of foot /ankle Principal Problem:   Cellulitis of right lower extremity Active Problems:   CHRONIC Edema of right lower extremity   Acute renal failure (ARF) (HCC)   SIRS with acute organ dysfunction due to infectious process (HCC)   Acute hyperglycemia   Obesity, Class II, BMI 35-39.9, isolated (HCC)   Cellulitis of right leg    Subjective: Febrile last night, "broke" this morning. Right foot feels swollen  Medications:  . ceFTAROline (TEFLARO) IV  600 mg Intravenous Q12H  . enoxaparin (LOVENOX) injection  55 mg Subcutaneous Q24H  . ondansetron (ZOFRAN) IV  4 mg Intravenous Once  . sodium chloride  500 mL Intravenous Once    Objective: Vital signs in last 24 hours: Temp:  [99.8 F (37.7 C)-103.1 F (39.5 C)] 99.8 F (37.7 C) (12/17 0942) Pulse Rate:  [91-104] 91 (12/17 0437) Resp:  [19-20] 20 (12/17 0437) BP: (111-149)/(60-89) 111/74 mmHg (12/17 0437) SpO2:  [92 %-98 %] 97 % (12/17 0437)  Gen = axo by 3 in nad Skin = right leg/calf has slight blanchign erythema nad swelling improved from admit, there is vesicular/bullous lesions to dorsum of foot and medial nad lateral aspect. More erythema still localized to foot and echymosis (agree with photos seen in dr. Nena Alexander note)  Lab Results  Recent Labs  12/22/14 0448 12/23/14 0530  WBC 8.7 8.0  HGB 12.4* 12.5*  HCT 36.6* 38.0*  NA 137 138  K 3.3* 3.2*  CL 107 104  CO2 23 24  BUN 11 8  CREATININE 1.50* 1.37*   Liver Panel  Recent Labs  12/22/14 0448 12/23/14 0530  PROT 5.3* 5.9*  ALBUMIN 2.6* 2.5*  AST 42* 32  ALT  60 56  ALKPHOS 46 53  BILITOT 1.6* 1.1    Microbiology: 12/15 blood cx ngtd  Assessment/Plan: Cellulitis of right lower leg/foot = continues to evolve. Agree with changing to ceftaroline which would cover both mrsa and strep. Continue to monitor fevers. Reassured that wbc is not elevated. It looks like he is responding to abtx by improved erythema. Fevers could be due to response to infection vs. possibly allergy to antibiotics. Will continue to monitor.   Baxter Flattery [facility] Cell: [phone] Pager: [phone]  12/23/2014, 12:16 PM

## 2014-12-23 NOTE — Progress Notes (Signed)
*  PRELIMINARY RESULTS* Vascular Ultrasound Lower extremity venous dupex Right has been completed.  Preliminary findings: No evidence of DVT or baker's cyst.  Landry Mellow, RDMS, RVT  12/23/2014, 12:30 PM

## 2014-12-24 ENCOUNTER — Inpatient Hospital Stay (HOSPITAL_COMMUNITY): Payer: Managed Care, Other (non HMO)

## 2014-12-24 LAB — COMPREHENSIVE METABOLIC PANEL
ALBUMIN: 2.1 g/dL — AB (ref 3.5–5.0)
ALT: 38 U/L (ref 17–63)
ANION GAP: 8 (ref 5–15)
AST: 20 U/L (ref 15–41)
Alkaline Phosphatase: 56 U/L (ref 38–126)
BUN: 7 mg/dL (ref 6–20)
CALCIUM: 7.9 mg/dL — AB (ref 8.9–10.3)
CHLORIDE: 104 mmol/L (ref 101–111)
CO2: 27 mmol/L (ref 22–32)
Creatinine, Ser: 1.21 mg/dL (ref 0.61–1.24)
GFR calc Af Amer: >60 mL/min (ref >60)
GFR calc non Af Amer: >60 mL/min (ref >60)
GLUCOSE: 115 mg/dL — AB (ref 65–99)
Potassium: 3.4 mmol/L — ABNORMAL LOW (ref 3.5–5.1)
SODIUM: 139 mmol/L (ref 135–145)
Total Bilirubin: 0.9 mg/dL (ref 0.3–1.2)
Total Protein: 5.2 g/dL — ABNORMAL LOW (ref 6.5–8.1)

## 2014-12-24 LAB — CBC WITH DIFFERENTIAL/PLATELET
BASOS ABS: 0 10*3/uL (ref 0.0–0.1)
BASOS PCT: 0 %
EOS ABS: 0.1 10*3/uL (ref 0.0–0.7)
Eosinophils Relative: 1 %
HCT: 31.9 % — ABNORMAL LOW (ref 39.0–52.0)
HEMOGLOBIN: 10.6 g/dL — AB (ref 13.0–17.0)
Lymphocytes Relative: 12 %
Lymphs Abs: 1 10*3/uL (ref 0.7–4.0)
MCH: 27.7 pg (ref 26.0–34.0)
MCHC: 33.2 g/dL (ref 30.0–36.0)
MCV: 83.5 fL (ref 78.0–100.0)
MONO ABS: 0.7 10*3/uL (ref 0.1–1.0)
Monocytes Relative: 9 %
NEUTROS ABS: 6.6 10*3/uL (ref 1.7–7.7)
NEUTROS PCT: 78 %
Platelets: 143 10*3/uL — ABNORMAL LOW (ref 150–400)
RBC: 3.82 MIL/uL — ABNORMAL LOW (ref 4.22–5.81)
RDW: 13.8 % (ref 11.5–15.5)
WBC: 8.4 10*3/uL (ref 4.0–10.5)

## 2014-12-24 MED ORDER — LIDOCAINE HCL (PF) 2 % IJ SOLN
0.0000 mL | Freq: Once | INTRAMUSCULAR | Status: DC | PRN
  Filled 2014-12-24 (×2): qty 20

## 2014-12-24 MED ORDER — CLINDAMYCIN PHOSPHATE 600 MG/50ML IV SOLN
600.0000 mg | Freq: Three times a day (TID) | INTRAVENOUS | Status: DC
  Administered 2014-12-24 – 2014-12-26 (×7): 600 mg via INTRAVENOUS
  Filled 2014-12-24 (×7): qty 50

## 2014-12-24 MED ORDER — POTASSIUM CHLORIDE CRYS ER 20 MEQ PO TBCR
40.0000 meq | EXTENDED_RELEASE_TABLET | Freq: Once | ORAL | Status: AC
  Administered 2014-12-24: 40 meq via ORAL
  Filled 2014-12-24: qty 2

## 2014-12-24 NOTE — Progress Notes (Addendum)
    [facility] for Infect[facility]    Date of Admission:  12/21/2014   Total days of antibiotics 3        Day 2 vanco/piptazo- d/c'd on 12/17        Day 2 ceftaroline        Day 1 clindamycin           ID: Benjamin Mcdowell is a 49 y.o. male with right leg cellulitis, presented with SIRS still having high fevers though leukocytosis improved. Erythema on right leg improved though more echymosis and blistering localized to dorsum of foot /ankle Principal Problem:   Cellulitis of right lower extremity Active Problems:   CHRONIC Edema of right lower extremity   Acute renal failure (ARF) (HCC)   SIRS with acute organ dysfunction due to infectious process (HCC)   Acute hyperglycemia   Obesity, Class II, BMI 35-39.9, isolated (HCC)   Cellulitis of right leg    Subjective: Fever curve trending down, though still had 4 episodes of fever rather than persistent fever from the prior 24 hrs. Right foot feels swollen  Medications:  . ceFTAROline (TEFLARO) IV  600 mg Intravenous Q12H  . clindamycin (CLEOCIN) IV  600 mg Intravenous 3 times per day  . enoxaparin (LOVENOX) injection  55 mg Subcutaneous Q24H  . ondansetron (ZOFRAN) IV  4 mg Intravenous Once  . sodium chloride  500 mL Intravenous Once    Objective: Vital signs in last 24 hours: Temp:  [99.8 F (37.7 C)-101.9 F (38.8 C)] 99.8 F (37.7 C) (12/18 0541) Pulse Rate:  [87-95] 87 (12/18 0541) Resp:  [18-19] 18 (12/18 0541) BP: (133-148)/(77-86) 148/86 mmHg (12/18 0541) SpO2:  [90 %-98 %] 90 % (12/18 0541)  Gen = axo by 3 in nad Skin = right leg/calf has slight blanchign erythema nad swelling improved from admit, there is vesicular/bullous lesions to dorsum of foot and medial nad lateral aspect. More erythema still localized to foot and increased hemorrhagic component to vesicles and bullae that continue to evolve.  Lab Results  Recent Labs  12/23/14 0530 12/24/14 0520  WBC 8.0 8.4  HGB 12.5* 10.6*  HCT 38.0* 31.9*    NA 138 139  K 3.2* 3.4*  CL 104 104  CO2 24 27  BUN 8 7  CREATININE 1.37* 1.21   Liver Panel  Recent Labs  12/23/14 0530 12/24/14 0520  PROT 5.9* 5.2*  ALBUMIN 2.5* 2.1*  AST 32 20  ALT 56 38  ALKPHOS 53 56  BILITOT 1.1 0.9    Microbiology: 12/15 blood cx ngtd  Assessment/Plan: Severe Cellulitis of right lower leg/foot with bullae/purpura vs. Vasculitis or autoimmune blistering with purpura= continues to evolve. Continue with ceftaroline which would cover both mrsa and strep. Will add clindamycin x 2-3 d to see if it also can help fight toxin production.  Informal review of photos with dermatology, their recommendation is to get punch biopsies of 2 different site:  of lesional skin and direct immuonfluorescence of perilesion skin.   Continue to monitor fevers. Reassured that wbc is not elevated. It looks like he is responding to abtx by improved erythema, though the bullae appear to still be evolving.  aki = improved   Baxter Flattery [facility] Cell: [phone] Pager: [phone]  12/24/2014, 11:27 AM

## 2014-12-24 NOTE — Progress Notes (Signed)
PATIENT DETAILS Name: Benjamin Mcdowell Age: 49 y.o. Sex: male Date of Birth: [birth year] Admit Date: 12/21/2014 Admitting Physician Elmarie Shiley, MD PCP:[name], Nickola Major., DO  Subjective: Although febrile over the past 24 hours-fever curve better. Unfortunately has more swelling/blistering on his right foot today (see pics below)  Assessment/Plan: Principal Problem: Sepsis: Although still febrile-fever curve better-sepsis pathophysiology improved-though more blistering/swelling in the legs.Spoke with Dr Baxter Flattery this am-recommendations are to continue with Teflaro, but to add Clindamycin. Blood cultures continue to be negative so far.    Active Problems: Cellulitis of right lower extremity: Overnight has more swelling and blistering of the skin of his right foot has worsened (see pics below)-not sure if this is related to skin/soft tissue infection or drug induced vasculitis. Abx changed to Teflaro on 12/17 (previously on Vanc/Zosyn). I will contact gen surgery to see if we can a bx of the skin in the affected area. Will hold off on starting steroids-as starting Clinda today. Although doubt deep infection-plans were to get a MRI-but patient has a penile implant that may not be MRI compatible-therefore will get a CT Leg. Doppler neg for DVT. (Note- No dermatology service at this facility)  ARF: Prerenal azotemia secondary to sepsis. Resolved with IVF.  Obesity, Class II, BMI 35-39.9: Counseled regarding importance of weight loss  Mild hyperglycemia-A1c 5.6. Monitor for now  Disposition: Remain inpatient  Antimicrobial agents  See below  Anti-infectives    Start     Dose/Rate Route Frequency Ordered Stop   12/24/14 1400  clindamycin (CLEOCIN) IVPB 600 mg     600 mg 100 mL/hr over 30 Minutes Intravenous 3 times per day 12/24/14 1017     12/23/14 1100  ceftaroline (TEFLARO) 600 mg in sodium chloride 0.9 % 250 mL IVPB     600 mg 250 mL/hr over 60 Minutes Intravenous  Every 12 hours 12/23/14 1000     12/22/14 1330  vancomycin (VANCOCIN) IVPB 750 mg/150 ml premix  Status:  Discontinued     750 mg 150 mL/hr over 60 Minutes Intravenous Every 12 hours 12/22/14 1254 12/23/14 1000   12/21/14 2200  vancomycin (VANCOCIN) IVPB 750 mg/150 ml premix  Status:  Discontinued     750 mg 150 mL/hr over 60 Minutes Intravenous Every 12 hours 12/21/14 1046 12/22/14 1254   12/21/14 1700  piperacillin-tazobactam (ZOSYN) IVPB 3.375 g  Status:  Discontinued     3.375 g 12.5 mL/hr over 240 Minutes Intravenous 3 times per day 12/21/14 1059 12/23/14 1000   12/21/14 1045  piperacillin-tazobactam (ZOSYN) IVPB 3.375 g     3.375 g 100 mL/hr over 30 Minutes Intravenous  Once 12/21/14 1041 12/21/14 1334   12/21/14 1045  vancomycin (VANCOCIN) IVPB 1000 mg/200 mL premix  Status:  Discontinued     1,000 mg 200 mL/hr over 60 Minutes Intravenous  Once 12/21/14 1041 12/21/14 1043   12/21/14 0930  vancomycin (VANCOCIN) 2,500 mg in sodium chloride 0.9 % 500 mL IVPB     2,500 mg 250 mL/hr over 120 Minutes Intravenous  Once 12/21/14 0849 12/21/14 1240      DVT Prophylaxis: Prophylactic Lovenox  Code Status: Full code   Family Communication None at bedside  Procedures: None  CONSULTS:  ID  Time spent 30 minutes-Greater than 50% of this time was spent in counseling, explanation of diagnosis, planning of further management, and coordination of care.  MEDICATIONS: Scheduled Meds: . ceFTAROline (TEFLARO) IV  600 mg Intravenous Q12H  . clindamycin (CLEOCIN) IV  600 mg Intravenous 3 times per day  . enoxaparin (LOVENOX) injection  55 mg Subcutaneous Q24H  . ondansetron (ZOFRAN) IV  4 mg Intravenous Once  . sodium chloride  500 mL Intravenous Once   Continuous Infusions: . sodium chloride 125 mL/hr at 12/24/14 0531   PRN Meds:.acetaminophen **OR** acetaminophen, morphine injection, oxyCODONE    PHYSICAL EXAM: Vital signs in last 24 hours: Filed Vitals:   12/23/14 1448  12/23/14 1515 12/23/14 2105 12/24/14 0541  BP:  135/82 133/77 148/86  Pulse:  95 92 87  Temp: 101.9 F (38.8 C) 101.1 F (38.4 C) 100.5 F (38.1 C) 99.8 F (37.7 C)  TempSrc: Oral Oral Oral Oral  Resp:  19 19 18   Height:      Weight:      SpO2:  98% 94% 90%    Weight change:  Filed Weights   12/21/14 0819 12/21/14 1704  Weight: 117.935 kg (260 lb) 117.935 kg (260 lb)   Body mass index is 35.25 kg/(m^2).   Gen Exam: Awake and alert with clear speech. Neck: Supple, No JVD.   Chest: B/L Clear.  No rales CVS: S1 S2 Regular, no murmurs.  Abdomen: soft, BS +, non tender, non distended.  Extremities: Right lower (see below pic-taken today). Left leg at usual baseline                 Neuro:non focal  Intake/Output from previous day:  Intake/Output Summary (Last 24 hours) at 12/24/14 1052 Last data filed at 12/24/14 0955  Gross per 24 hour  Intake 3217.67 ml  Output    900 ml  Net 2317.67 ml     LAB RESULTS: CBC  Recent Labs Lab 12/21/14 0900 12/22/14 0448 12/23/14 0530 12/24/14 0520  WBC 14.3* 8.7 8.0 8.4  HGB 14.7 12.4* 12.5* 10.6*  HCT 43.1 36.6* 38.0* 31.9*  PLT 169 123* 122* 143*  MCV 84.2 84.1 84.3 83.5  MCH 28.7 28.5 27.7 27.7  MCHC 34.1 33.9 32.9 33.2  RDW 13.7 13.7 13.7 13.8  LYMPHSABS 0.6*  --   --  1.0  MONOABS 0.8  --   --  0.7  EOSABS 0.0  --   --  0.1  BASOSABS 0.0  --   --  0.0    Chemistries   Recent Labs Lab 12/21/14 0900 12/22/14 0448 12/23/14 0530 12/24/14 0520  NA 137 137 138 139  K 4.3 3.3* 3.2* 3.4*  CL 102 107 104 104  CO2 27 23 24 27   GLUCOSE 139* 137* 110* 115*  BUN 19 11 8 7   CREATININE 1.41* 1.50* 1.37* 1.21  CALCIUM 8.9 7.6* 8.1* 7.9*    CBG: No results for input(s): GLUCAP in the last 168 hours.  GFR Estimated Creatinine Clearance: 97.9 mL/min (by C-G formula based on Cr of 1.21).  Coagulation profile No results for input(s): INR, PROTIME in the last 168 hours.  Cardiac Enzymes No results  for input(s): CKMB, TROPONINI, MYOGLOBIN in the last 168 hours.  Invalid input(s): CK  Invalid input(s): POCBNP No results for input(s): DDIMER in the last 72 hours. No results for input(s): HGBA1C in the last 72 hours. No results for input(s): CHOL, HDL, LDLCALC, TRIG, CHOLHDL, LDLDIRECT in the last 72 hours. No results for input(s): TSH, T4TOTAL, T3FREE, THYROIDAB in the last 72 hours.  Invalid input(s): FREET3 No results for input(s): VITAMINB12, FOLATE, FERRITIN, TIBC, IRON, RETICCTPCT in the last 72 hours. No results for  input(s): LIPASE, AMYLASE in the last 72 hours.  Urine Studies No results for input(s): UHGB, CRYS in the last 72 hours.  Invalid input(s): UACOL, UAPR, USPG, UPH, UTP, UGL, UKET, UBIL, UNIT, UROB, ULEU, UEPI, UWBC, URBC, UBAC, CAST, UCOM, BILUA  MICROBIOLOGY: Recent Results (from the past 240 hour(s))  Culture, blood (Routine X 2) w Reflex to ID Panel     Status: None (Preliminary result)   Collection Time: 12/21/14  9:00 AM  Result Value Ref Range Status   Specimen Description BLOOD LEFT ANTECUBITAL  Final   Special Requests BOTTLES DRAWN AEROBIC AND ANAEROBIC 5MLS  Final   Culture NO GROWTH 3 DAYS  Final   Report Status PENDING  Incomplete  Culture, blood (Routine X 2) w Reflex to ID Panel     Status: None (Preliminary result)   Collection Time: 12/21/14 10:24 AM  Result Value Ref Range Status   Specimen Description BLOOD RIGHT HAND  Final   Special Requests BOTTLES DRAWN AEROBIC AND ANAEROBIC 5CCS  Final   Culture NO GROWTH 3 DAYS  Final   Report Status PENDING  Incomplete    RADIOLOGY STUDIES/RESULTS: Dg Chest 2 View  12/21/2014  CLINICAL DATA:  Cellulitis RIGHT lower leg, fever 103 degrees, cough EXAM: CHEST  2 VIEW COMPARISON:  None FINDINGS: Normal heart size, mediastinal contours, and pulmonary vascularity. Mild peribronchial thickening. No pulmonary infiltrate, pleural effusion, or pneumothorax. RIGHT glenohumeral degenerative changes. Bones  otherwise unremarkable. IMPRESSION: Mild bronchitic changes without infiltrate. Electronically Signed   By: Lavonia Dana M.D.   On: 12/21/2014 12:31    Oren Binet, MD  [facility] Pager:[phone]  If 7PM-7AM, please contact night-coverage [email or web address] Password [email or web address] 12/24/2014, 10:52 AM   LOS: 3 days

## 2014-12-25 DIAGNOSIS — N171 Acute kidney failure with acute cortical necrosis: Secondary | ICD-10-CM

## 2014-12-25 DIAGNOSIS — A419 Sepsis, unspecified organism: Secondary | ICD-10-CM | POA: Insufficient documentation

## 2014-12-25 DIAGNOSIS — D692 Other nonthrombocytopenic purpura: Secondary | ICD-10-CM | POA: Insufficient documentation

## 2014-12-25 DIAGNOSIS — L959 Vasculitis limited to the skin, unspecified: Secondary | ICD-10-CM | POA: Insufficient documentation

## 2014-12-25 DIAGNOSIS — R600 Localized edema: Secondary | ICD-10-CM

## 2014-12-25 DIAGNOSIS — R6520 Severe sepsis without septic shock: Secondary | ICD-10-CM

## 2014-12-25 LAB — CBC
HCT: 32.5 % — ABNORMAL LOW (ref 39.0–52.0)
Hemoglobin: 10.9 g/dL — ABNORMAL LOW (ref 13.0–17.0)
MCH: 27.7 pg (ref 26.0–34.0)
MCHC: 33.5 g/dL (ref 30.0–36.0)
MCV: 82.7 fL (ref 78.0–100.0)
PLATELETS: 164 10*3/uL (ref 150–400)
RBC: 3.93 MIL/uL — AB (ref 4.22–5.81)
RDW: 13.9 % (ref 11.5–15.5)
WBC: 8.1 10*3/uL (ref 4.0–10.5)

## 2014-12-25 LAB — COMPREHENSIVE METABOLIC PANEL WITH GFR
ALT: 39 U/L (ref 17–63)
AST: 23 U/L (ref 15–41)
Albumin: 2.2 g/dL — ABNORMAL LOW (ref 3.5–5.0)
Alkaline Phosphatase: 60 U/L (ref 38–126)
Anion gap: 9 (ref 5–15)
BUN: 10 mg/dL (ref 6–20)
CO2: 27 mmol/L (ref 22–32)
Calcium: 8 mg/dL — ABNORMAL LOW (ref 8.9–10.3)
Chloride: 104 mmol/L (ref 101–111)
Creatinine, Ser: 1.14 mg/dL (ref 0.61–1.24)
GFR calc Af Amer: >60 mL/min
GFR calc non Af Amer: >60 mL/min
Glucose, Bld: 118 mg/dL — ABNORMAL HIGH (ref 65–99)
Potassium: 3.7 mmol/L (ref 3.5–5.1)
Sodium: 140 mmol/L (ref 135–145)
Total Bilirubin: 0.7 mg/dL (ref 0.3–1.2)
Total Protein: 5.3 g/dL — ABNORMAL LOW (ref 6.5–8.1)

## 2014-12-25 LAB — PROCALCITONIN: Procalcitonin: 0.67 ng/mL

## 2014-12-25 MED ORDER — METHYLPREDNISOLONE SODIUM SUCC 125 MG IJ SOLR
60.0000 mg | Freq: Two times a day (BID) | INTRAMUSCULAR | Status: DC
  Administered 2014-12-25 – 2014-12-28 (×7): 60 mg via INTRAVENOUS
  Filled 2014-12-25 (×7): qty 2

## 2014-12-25 MED ORDER — LIDOCAINE-EPINEPHRINE (PF) 1.5 %-1:200000 IJ SOLN
20.0000 mL | Freq: Once | INTRAMUSCULAR | Status: DC
  Filled 2014-12-25: qty 20

## 2014-12-25 NOTE — Consult Note (Signed)
Reason for Consult:  Request for skin biopsy Referring Physician: Dr. Oren Binet  Benjamin Mcdowell is an 49 y.o. male.  HPI: pt admitted on 12/21/14 with chronic edema RLE, flu like symptoms, redness right foot with swelling, he has a good picture on his phone followed by fevers, his foot was swollen, red and hot to the touch on admit.  He had fever up to 103 on admit and was admitted by Medicine for cellulitis with SIRS, acute renal failure, hyperglycemia.  He was seen by ID and Dr. Johnnye Sima who notes: RLE extremity edematous from foot to half-way up the calf with erythema and purple intact blisters around the ankle and heel. It is warm and tender to touch. Toenails clean, well-kept. No discharge or lacerations noted.  He was treated with Zosyn and Vancomycin.  Over the first 24 hours the cellulitis changed to black discoloration/blistering of the skin of his right foot.  See pictures below.  Swelling and blistering is worse.  Dr. Baxter Flattery reviewed pictures with Dermatology;  and their recommendation: "is to get punch biopsies of 2 different site: of lesional skin and direct immuonfluorescence of perilesion skin."  On admit TM 103. VSS, lactate up to 3.4, better with hydration.  Normal CBC, mild renal insuffiencey now resolved. Currently his WBC is normal, CMP is also normal.   CT scan of the foot yesterday shows:  . Generalized subcutaneous edema throughout the foot consistent with given clinical impression of cellulitis. No focal abscess identified on noncontrast imaging.   Skin blister involving the medial hindfoot.   No evidence of osteomyelitis.  We were called to do skin biopsies, no dermatology service available here.  Per pathology if they want 2 sites with immunofluorescence we need to take a total of 4 biopsies, specimens for Immunofluorescence is sent to another facility. [facility] is not working right now so I cannot update the pictures.  I informed the patient that the risk of this include non  healing of the puncture site and progression of the infection.  He discussed this with Dr.Ghimire and we are going to go forward with the biopsies.      12/22/14   12/23/14 Extremities: Right lower (see below pic-taken today). Left leg at usual baseline              Last 3 are 12/24/14.   Past Medical History  Diagnosis Date  . Anal fissure     "lanced it w/my hemorrhoids"  . Cellulitis     recurrent cellulitis right foot-per patient-per patient in hospital for IV antibiotics 01/2012  . Edema     LE, R>L since child hood  . Hemorrhoids   . Cancer of skin of back     "don't know which kind"  . Cellulitis of right lower extremity hospitalized 12/21/2014    Past Surgical History  Procedure Laterality Date  . Penis revascularization surgery    . Umbilical hernia repair  12/24/2007    with Proceed ventral  . Hernia repair    . Hemorrhoid surgery  1985    "had them lanced; also lanced anal fissure"    Family History  Problem Relation Age of Onset  . Prostate cancer Father   . Heart disease      GRANDFATHER    Social History:  reports that he has never smoked. He has never used smokeless tobacco. He reports that he drinks about 3.6 oz of alcohol per week. He reports that he uses illicit drugs (Marijuana).  Allergies: No  Known Allergies  Medications:  Prior to Admission:  Prescriptions prior to admission  Medication Sig Dispense Refill Last Dose  . ibuprofen (ADVIL,MOTRIN) 200 MG tablet Take 400 mg by mouth every 6 (six) hours as needed for moderate pain.   12/20/2014 at Unknown time  . protein supplement shake (PREMIER PROTEIN) LIQD Take 2 oz by mouth daily.   12/20/2014 at Unknown time   Scheduled: . ceFTAROline (TEFLARO) IV  600 mg Intravenous Q12H  . clindamycin (CLEOCIN) IV  600 mg Intravenous 3 times per day  . enoxaparin (LOVENOX) injection  55 mg Subcutaneous Q24H  . lidocaine-EPINEPHrine  20 mL Infiltration Once  . ondansetron (ZOFRAN) IV  4 mg  Intravenous Once  . sodium chloride  500 mL Intravenous Once   Continuous: . sodium chloride 100 mL/hr at 12/25/14 0523   LZJ:[id number] **OR** acetaminophen, lidocaine, morphine injection, oxyCODONE Anti-infectives    Start     Dose/Rate Route Frequency Ordered Stop   12/24/14 1400  clindamycin (CLEOCIN) IVPB 600 mg     600 mg 100 mL/hr over 30 Minutes Intravenous 3 times per day 12/24/14 1017     12/23/14 1100  ceftaroline (TEFLARO) 600 mg in sodium chloride 0.9 % 250 mL IVPB     600 mg 250 mL/hr over 60 Minutes Intravenous Every 12 hours 12/23/14 1000     12/22/14 1330  vancomycin (VANCOCIN) IVPB 750 mg/150 ml premix  Status:  Discontinued     750 mg 150 mL/hr over 60 Minutes Intravenous Every 12 hours 12/22/14 1254 12/23/14 1000   12/21/14 2200  vancomycin (VANCOCIN) IVPB 750 mg/150 ml premix  Status:  Discontinued     750 mg 150 mL/hr over 60 Minutes Intravenous Every 12 hours 12/21/14 1046 12/22/14 1254   12/21/14 1700  piperacillin-tazobactam (ZOSYN) IVPB 3.375 g  Status:  Discontinued     3.375 g 12.5 mL/hr over 240 Minutes Intravenous 3 times per day 12/21/14 1059 12/23/14 1000   12/21/14 1045  piperacillin-tazobactam (ZOSYN) IVPB 3.375 g     3.375 g 100 mL/hr over 30 Minutes Intravenous  Once 12/21/14 1041 12/21/14 1334   12/21/14 1045  vancomycin (VANCOCIN) IVPB 1000 mg/200 mL premix  Status:  Discontinued     1,000 mg 200 mL/hr over 60 Minutes Intravenous  Once 12/21/14 1041 12/21/14 1043   12/21/14 0930  vancomycin (VANCOCIN) 2,500 mg in sodium chloride 0.9 % 500 mL IVPB     2,500 mg 250 mL/hr over 120 Minutes Intravenous  Once 12/21/14 0849 12/21/14 1240      Results for orders placed or performed during the hospital encounter of 12/21/14 (from the past 48 hour(s))  CBC with Differential/Platelet     Status: Abnormal   Collection Time: 12/24/14  5:20 AM  Result Value Ref Range   WBC 8.4 4.0 - 10.5 K/uL   RBC 3.82 (L) 4.22 - 5.81 MIL/uL   Hemoglobin 10.6 (L)  13.0 - 17.0 g/dL   HCT 31.9 (L) 39.0 - 52.0 %   MCV 83.5 78.0 - 100.0 fL   MCH 27.7 26.0 - 34.0 pg   MCHC 33.2 30.0 - 36.0 g/dL   RDW 13.8 11.5 - 15.5 %   Platelets 143 (L) 150 - 400 K/uL   Neutrophils Relative % 78 %   Neutro Abs 6.6 1.7 - 7.7 K/uL   Lymphocytes Relative 12 %   Lymphs Abs 1.0 0.7 - 4.0 K/uL   Monocytes Relative 9 %   Monocytes Absolute 0.7 0.1 - 1.0 K/uL  Eosinophils Relative 1 %   Eosinophils Absolute 0.1 0.0 - 0.7 K/uL   Basophils Relative 0 %   Basophils Absolute 0.0 0.0 - 0.1 K/uL  Comprehensive metabolic panel     Status: Abnormal   Collection Time: 12/24/14  5:20 AM  Result Value Ref Range   Sodium 139 135 - 145 mmol/L   Potassium 3.4 (L) 3.5 - 5.1 mmol/L   Chloride 104 101 - 111 mmol/L   CO2 27 22 - 32 mmol/L   Glucose, Bld 115 (H) 65 - 99 mg/dL   BUN 7 6 - 20 mg/dL   Creatinine, Ser 1.21 0.61 - 1.24 mg/dL   Calcium 7.9 (L) 8.9 - 10.3 mg/dL   Total Protein 5.2 (L) 6.5 - 8.1 g/dL   Albumin 2.1 (L) 3.5 - 5.0 g/dL   AST 20 15 - 41 U/L   ALT 38 17 - 63 U/L   Alkaline Phosphatase 56 38 - 126 U/L   Total Bilirubin 0.9 0.3 - 1.2 mg/dL   GFR calc non Af Amer >60 >60 mL/min   GFR calc Af Amer >60 >60 mL/min    Comment: (NOTE) The eGFR has been calculated using the CKD EPI equation. This calculation has not been validated in all clinical situations. eGFR's persistently <60 mL/min signify possible Chronic Kidney Disease.    Anion gap 8 5 - 15  Procalcitonin     Status: None   Collection Time: 12/25/14  5:25 AM  Result Value Ref Range   Procalcitonin 0.67 ng/mL    Comment:        Interpretation: PCT > 0.5 ng/mL and <= 2 ng/mL: Systemic infection (sepsis) is possible, but other conditions are known to elevate PCT as well. (NOTE)         ICU PCT Algorithm               Non ICU PCT Algorithm    ----------------------------     ------------------------------         PCT < 0.25 ng/mL                 PCT < 0.1 ng/mL     Stopping of antibiotics             Stopping of antibiotics       strongly encouraged.               strongly encouraged.    ----------------------------     ------------------------------       PCT level decrease by               PCT < 0.25 ng/mL       >= 80% from peak PCT       OR PCT 0.25 - 0.5 ng/mL          Stopping of antibiotics                                             encouraged.     Stopping of antibiotics           encouraged.    ----------------------------     ------------------------------       PCT level decrease by              PCT >= 0.25 ng/mL       < 80% from peak PCT  AND PCT >= 0.5 ng/mL             Continuing antibiotics                                              encouraged.       Continuing antibiotics            encouraged.    ----------------------------     ------------------------------     PCT level increase compared          PCT > 0.5 ng/mL         with peak PCT AND          PCT >= 0.5 ng/mL             Escalation of antibiotics                                          strongly encouraged.      Escalation of antibiotics        strongly encouraged.   CBC     Status: Abnormal   Collection Time: 12/25/14  5:25 AM  Result Value Ref Range   WBC 8.1 4.0 - 10.5 K/uL   RBC 3.93 (L) 4.22 - 5.81 MIL/uL   Hemoglobin 10.9 (L) 13.0 - 17.0 g/dL   HCT 32.5 (L) 39.0 - 52.0 %   MCV 82.7 78.0 - 100.0 fL   MCH 27.7 26.0 - 34.0 pg   MCHC 33.5 30.0 - 36.0 g/dL   RDW 13.9 11.5 - 15.5 %   Platelets 164 150 - 400 K/uL  Comprehensive metabolic panel     Status: Abnormal   Collection Time: 12/25/14  5:25 AM  Result Value Ref Range   Sodium 140 135 - 145 mmol/L   Potassium 3.7 3.5 - 5.1 mmol/L   Chloride 104 101 - 111 mmol/L   CO2 27 22 - 32 mmol/L   Glucose, Bld 118 (H) 65 - 99 mg/dL   BUN 10 6 - 20 mg/dL   Creatinine, Ser 1.14 0.61 - 1.24 mg/dL   Calcium 8.0 (L) 8.9 - 10.3 mg/dL   Total Protein 5.3 (L) 6.5 - 8.1 g/dL   Albumin 2.2 (L) 3.5 - 5.0 g/dL   AST 23 15 - 41 U/L   ALT 39 17 -  63 U/L   Alkaline Phosphatase 60 38 - 126 U/L   Total Bilirubin 0.7 0.3 - 1.2 mg/dL   GFR calc non Af Amer >60 >60 mL/min   GFR calc Af Amer >60 >60 mL/min    Comment: (NOTE) The eGFR has been calculated using the CKD EPI equation. This calculation has not been validated in all clinical situations. eGFR's persistently <60 mL/min signify possible Chronic Kidney Disease.    Anion gap 9 5 - 15    Ct Foot Right Wo Contrast  12/24/2014  CLINICAL DATA:  Enhancing right foot cellulitis with pain and swelling. EXAM: CT OF THE RIGHT FOOT WITHOUT CONTRAST TECHNIQUE: Multidetector CT imaging of the right foot was performed according to the standard protocol. Multiplanar CT image reconstructions were also generated. COMPARISON:  Radiographs 01/05/2013.  No recent studies. FINDINGS: There is moderate subcutaneous edema throughout the foot, greatest dorsally. No focal fluid collections are identified on noncontrast imaging. There  is apparent skin blistering along the medial aspect of the hindfoot, measuring up to 3.3 cm in diameter. As evaluated by CT, the ankle tendons appear intact without significant tenosynovitis. There is no significant ankle joint effusion. There are no significant arthropathic changes other than mild degenerative changes at the first MTP joint. No evidence of acute fracture, dislocation or osteomyelitis. IMPRESSION: 1. Generalized subcutaneous edema throughout the foot consistent with given clinical impression of cellulitis. No focal abscess identified on noncontrast imaging. 2. Skin blister involving the medial hindfoot. 3. No evidence of osteomyelitis. Electronically Signed   By: Richardean Sale M.D.   On: 12/24/2014 19:48    Review of Systems  Constitutional: Positive for fever and malaise/fatigue.       Says he started feeling bad 12/20/14.  Foot became erythematous at that point and swollen.    HENT: Negative.   Eyes: Negative.   Respiratory: Negative.   Cardiovascular:  Negative.   Gastrointestinal: Negative for heartburn, nausea, vomiting, abdominal pain, diarrhea, constipation and blood in stool.  Genitourinary: Negative.   Musculoskeletal: Negative.   Skin: Positive for rash.       See pictures below.  This started out as a erythematous rash with swelling.  Neurological: Negative.   Endo/Heme/Allergies: Positive for polydipsia.  Psychiatric/Behavioral: Negative.    Blood pressure 138/83, pulse 79, temperature 98.9 F (37.2 C), temperature source Oral, resp. rate 16, height 6' (1.829 m), weight 117.935 kg (260 lb), SpO2 96 %. Physical Exam  Constitutional: He is oriented to person, place, and time. He appears well-developed.  HENT:  Head: Normocephalic.  Nose: Nose normal.  Eyes: Conjunctivae are normal. Right eye exhibits no discharge. Left eye exhibits no discharge. No scleral icterus.  Neck: Normal range of motion. Neck supple. No JVD present. No tracheal deviation present. No thyromegaly present.  Cardiovascular: Normal rate, regular rhythm and normal heart sounds.   No murmur heard. Doppler pulse found by RN.  But I cannot palpated it,  secondary to swelling.  Good distal pulses in the left foot.  Respiratory: Effort normal and breath sounds normal. No respiratory distress. He has no wheezes. He has no rales. He exhibits no tenderness.  GI: Soft. Bowel sounds are normal. He exhibits no distension and no mass. There is no tenderness. There is no rebound and no guarding.  Musculoskeletal: He exhibits no edema or tenderness.  Lymphadenopathy:    He has no cervical adenopathy.  Neurological: He is alert and oriented to person, place, and time. No cranial nerve deficit.  Skin: Skin is warm and dry. Rash noted. He is not diaphoretic. There is erythema.  Psychiatric: He has a normal mood and affect. His behavior is normal. Judgment and thought content normal.    Assessment/Plan: RLE cellulitis with progressive rash, now with ecchymosis and bullous  formation to the lower foot and leg. SIRS Acute renal failure improving Body mass index is 35.25 kg/(m^2).  Prior hx of cocaine use (6 weeks ago)     Plan:  I have gone with Dr. Sloan Leiter and we have pick a place right lateral lower leg above the ankle for biopsy.  I will get 2 specimens, one for immunofluorescence, and one regular skin biopsy.  Risk discussed with the patient and Dr. Sloan Leiter, and we are going to proceed.   Benjamin Mcdowell 12/25/2014, 11:18 AM

## 2014-12-25 NOTE — Progress Notes (Signed)
[facility]      Subjective: Patient had fevers again last night which improved this morning. He feels his right foot blistering has worsened.  Objective: Vital signs in last 24 hours: Filed Vitals:   12/24/14 1417 12/24/14 1525 12/24/14 2114 12/25/14 0551  BP:   143/88 138/83  Pulse:   89 79  Temp: 101.9 F (38.8 C) 100.9 F (38.3 C) 100 F (37.8 C) 98.9 F (37.2 C)  TempSrc:  Oral Oral Oral  Resp:   18 16  Height:      Weight:      SpO2:   93% 96%   General: resting in bed Cardiac: RRR, no rubs, murmurs or gallops Pulm: clear to auscultation bilaterally, moving normal volumes of air Abd: soft, nontender, nondistended, BS present Ext: Right foot with multiple dark purple/black vesicular/bullous lesions at the dorsum of the foot, medial and lateral ankle, and lower shin. Erythema localized to foot. No open sores, drainage.   Lab Results: Basic Metabolic Panel:  Recent Labs Lab 12/24/14 0520 12/25/14 0525  NA 139 140  K 3.4* 3.7  CL 104 104  CO2 27 27  GLUCOSE 115* 118*  BUN 7 10  CREATININE 1.21 1.14  CALCIUM 7.9* 8.0*   No results for input(s): LIPASE, AMYLASE in the last 168 hours. No results for input(s): AMMONIA in the last 168 hours. CBC:  Recent Labs Lab 12/21/14 0900  12/24/14 0520 12/25/14 0525  WBC 14.3*  < > 8.4 8.1  NEUTROABS 12.9*  --  6.6  --   HGB 14.7  < > 10.6* 10.9*  HCT 43.1  < > 31.9* 32.5*  MCV 84.2  < > 83.5 82.7  PLT 169  < > 143* 164  < > = values in this interval not displayed.  Micro Results: Recent Results (from the past 240 hour(s))  Culture, blood (Routine X 2) w Reflex to ID Panel     Status: None (Preliminary result)   Collection Time: 12/21/14  9:00 AM  Result Value Ref Range Status   Specimen Description BLOOD LEFT ANTECUBITAL  Final   Special Requests BOTTLES DRAWN AEROBIC AND ANAEROBIC 5MLS  Final   Culture NO GROWTH 3 DAYS  Final   Report Status PENDING  Incomplete  Culture,  blood (Routine X 2) w Reflex to ID Panel     Status: None (Preliminary result)   Collection Time: 12/21/14 10:24 AM  Result Value Ref Range Status   Specimen Description BLOOD RIGHT HAND  Final   Special Requests BOTTLES DRAWN AEROBIC AND ANAEROBIC 5CCS  Final   Culture NO GROWTH 3 DAYS  Final   Report Status PENDING  Incomplete   Studies/Results: Ct Foot Right Wo Contrast  12/24/2014  CLINICAL DATA:  Enhancing right foot cellulitis with pain and swelling. EXAM: CT OF THE RIGHT FOOT WITHOUT CONTRAST TECHNIQUE: Multidetector CT imaging of the right foot was performed according to the standard protocol. Multiplanar CT image reconstructions were also generated. COMPARISON:  Radiographs 01/05/2013.  No recent studies. FINDINGS: There is moderate subcutaneous edema throughout the foot, greatest dorsally. No focal fluid collections are identified on noncontrast imaging. There is apparent skin blistering along the medial aspect of the hindfoot, measuring up to 3.3 cm in diameter. As evaluated by CT, the ankle tendons appear intact without significant tenosynovitis. There is no significant ankle joint effusion. There are no significant arthropathic changes other than mild degenerative changes at the first MTP joint. No evidence of acute fracture,  dislocation or osteomyelitis. IMPRESSION: 1. Generalized subcutaneous edema throughout the foot consistent with given clinical impression of cellulitis. No focal abscess identified on noncontrast imaging. 2. Skin blister involving the medial hindfoot. 3. No evidence of osteomyelitis. Electronically Signed   By: Richardean Sale M.D.   On: 12/24/2014 19:48   Scheduled Meds: . ceFTAROline (TEFLARO) IV  600 mg Intravenous Q12H  . clindamycin (CLEOCIN) IV  600 mg Intravenous 3 times per day  . enoxaparin (LOVENOX) injection  55 mg Subcutaneous Q24H  . ondansetron (ZOFRAN) IV  4 mg Intravenous Once  . sodium chloride  500 mL Intravenous Once   Continuous Infusions: .  sodium chloride 100 mL/hr at 12/25/14 0523   PRN Meds:.acetaminophen **OR** acetaminophen, lidocaine, morphine injection, oxyCODONE Assessment/Plan: Principal Problem:   Cellulitis of right lower extremity Active Problems:   CHRONIC Edema of right lower extremity   Acute renal failure (ARF) ([place])   SIRS with acute organ dysfunction due to infectious process ([place])   Acute hyperglycemia   Obesity, Class II, BMI 35-39.9, isolated ([facility])   Cellulitis of right leg  Benjamin Mcdowell is a 49 year old male with PMH of chronic right lower extremity edema with recurrent cellulitis and Obesity who is admitted for RLE cellulitis and SIRS presentation.   Total days antibiotics: 4 2 Days Vanc/Zosyn: started 12/15, d/c'd 12/17 Day 3 Ceftaroline: started 12/17 Day 2 Clindamycin: started 12/18  Cellulitis of RLE/foot: Worsening blistering and purpura of RLE/foot compared to admission. Possibly severe cellulitis vs autoimmune or vasculitic process. Erythema no longer involving upper thigh, now localized to foot and distal RLE. WBC is WNL.  On day 3 of Ceftaroline for MRSA and Strep coverage, Clindamycin added yesterday for 2-3 days to help fight toxin production. CT imaging without evidence for osteomyelitis (MRI not done due to penile prosthesis). Plan for punch biopsies today. -Continue Ceftaroline and Clindamycin -f/u biopsy -monitor fevers   LOS: 4 days   Zada Finders, MD 12/25/2014, 10:19 AM

## 2014-12-25 NOTE — Progress Notes (Signed)
Procedure note:  Right lateral lower leg site pick about 5 cm above the ankle. Sites were preped with betadine skin prep.    2 sites injected with 1.5% lidocaine with epinephrine. 3 cc injected into each site with a 25g needle.   After anesthesia was adequate, a 4 mm skin punch was used to obtain biopsies from 2 site that were near each other..  Direct pressure was held to control bleeding and then a dry sterile dressing. He has significant edema in the lower leg.  I am taking specimens to pathology myself.

## 2014-12-25 NOTE — Progress Notes (Signed)
PATIENT DETAILS Name: Benjamin Mcdowell Age: 49 y.o. Sex: male Date of Birth: 09-11-1965 Admit Date: 12/21/2014 Admitting Physician Elmarie Shiley, MD PCP:[name], Nickola Major., DO  Subjective: Foot essentially the same. Still febrile-but curve better  Assessment/Plan: Principal Problem: Sepsis: Continues to be febrile, however WBC has improved. Right lower extremity looks essentially unchanged that from yesterday. Continue with Teflaro and clindamycin, Dr. Baxter Flattery had recommended adding steroids after punch biopsy was complete-hence will start Solu-Medrol 60 mg IV twice a day. Blood cultures continue to be negative   Active Problems: Cellulitis of right lower extremity: Right foot/leg essentially unchanged-still with significant ecchymosis/blistering. Not sure if this is soft tissue infection or a vasculitis.Abx changed to Teflaro on 12/17 (previously on Vanc/Zosyn), clindamycin added on 12/18. Dr.Snider had recommended adding steroids after punch biopsy was complete-hence will start Solu-Medrol 60 mg IV twice a day. CT of the right leg negative for deep infection, Doppler negative for DVT. (Note- No dermatology service at this facility)  ARF: Prerenal azotemia secondary to sepsis. Resolved with IVF.  Obesity, Class II, BMI 35-39.9: Counseled regarding importance of weight loss  Mild hyperglycemia-A1c 5.6. Monitor for now  Disposition: Remain inpatient  Antimicrobial agents  See below  Anti-infectives    Start     Dose/Rate Route Frequency Ordered Stop   12/24/14 1400  clindamycin (CLEOCIN) IVPB 600 mg     600 mg 100 mL/hr over 30 Minutes Intravenous 3 times per day 12/24/14 1017     12/23/14 1100  ceftaroline (TEFLARO) 600 mg in sodium chloride 0.9 % 250 mL IVPB     600 mg 250 mL/hr over 60 Minutes Intravenous Every 12 hours 12/23/14 1000     12/22/14 1330  vancomycin (VANCOCIN) IVPB 750 mg/150 ml premix  Status:  Discontinued     750 mg 150 mL/hr over 60 Minutes  Intravenous Every 12 hours 12/22/14 1254 12/23/14 1000   12/21/14 2200  vancomycin (VANCOCIN) IVPB 750 mg/150 ml premix  Status:  Discontinued     750 mg 150 mL/hr over 60 Minutes Intravenous Every 12 hours 12/21/14 1046 12/22/14 1254   12/21/14 1700  piperacillin-tazobactam (ZOSYN) IVPB 3.375 g  Status:  Discontinued     3.375 g 12.5 mL/hr over 240 Minutes Intravenous 3 times per day 12/21/14 1059 12/23/14 1000   12/21/14 1045  piperacillin-tazobactam (ZOSYN) IVPB 3.375 g     3.375 g 100 mL/hr over 30 Minutes Intravenous  Once 12/21/14 1041 12/21/14 1334   12/21/14 1045  vancomycin (VANCOCIN) IVPB 1000 mg/200 mL premix  Status:  Discontinued     1,000 mg 200 mL/hr over 60 Minutes Intravenous  Once 12/21/14 1041 12/21/14 1043   12/21/14 0930  vancomycin (VANCOCIN) 2,500 mg in sodium chloride 0.9 % 500 mL IVPB     2,500 mg 250 mL/hr over 120 Minutes Intravenous  Once 12/21/14 0849 12/21/14 1240      DVT Prophylaxis: Prophylactic Lovenox  Code Status: Full code   Family Communication None at bedside  Procedures: None  CONSULTS:  ID  Time spent 30 minutes-Greater than 50% of this time was spent in counseling, explanation of diagnosis, planning of further management, and coordination of care.  MEDICATIONS: Scheduled Meds: . ceFTAROline (TEFLARO) IV  600 mg Intravenous Q12H  . clindamycin (CLEOCIN) IV  600 mg Intravenous 3 times per day  . enoxaparin (LOVENOX) injection  55 mg Subcutaneous Q24H  . lidocaine-EPINEPHrine  20 mL Infiltration  Once  . methylPREDNISolone (SOLU-MEDROL) injection  60 mg Intravenous Q12H  . ondansetron (ZOFRAN) IV  4 mg Intravenous Once  . sodium chloride  500 mL Intravenous Once   Continuous Infusions: . sodium chloride 100 mL/hr at 12/25/14 0523   PRN Meds:.acetaminophen **OR** acetaminophen, lidocaine, morphine injection, oxyCODONE    PHYSICAL EXAM: Vital signs in last 24 hours: Filed Vitals:   12/24/14 1525 12/24/14 2114 12/25/14 0551  12/25/14 1448  BP:  143/88 138/83   Pulse:  89 79   Temp: 100.9 F (38.3 C) 100 F (37.8 C) 98.9 F (37.2 C) 99.8 F (37.7 C)  TempSrc: Oral Oral Oral Oral  Resp:  18 16   Height:      Weight:      SpO2:  93% 96%     Weight change:  Filed Weights   12/21/14 0819 12/21/14 1704  Weight: 117.935 kg (260 lb) 117.935 kg (260 lb)   Body mass index is 35.25 kg/(m^2).   Gen Exam: Awake and alert with clear speech. Neck: Supple, No JVD.   Chest: B/L Clear.  No rhonchi CVS: S1 S2 Regular, no murmurs.  Abdomen: soft, BS +, non tender, non distended.  Extremities: Right lower (see below pic-taken today). Left leg at usual baseline                 Neuro:non focal  Intake/Output from previous day:  Intake/Output Summary (Last 24 hours) at 12/25/14 1455 Last data filed at 12/25/14 1130  Gross per 24 hour  Intake 3296.67 ml  Output   2400 ml  Net 896.67 ml     LAB RESULTS: CBC  Recent Labs Lab 12/21/14 0900 12/22/14 0448 12/23/14 0530 12/24/14 0520 12/25/14 0525  WBC 14.3* 8.7 8.0 8.4 8.1  HGB 14.7 12.4* 12.5* 10.6* 10.9*  HCT 43.1 36.6* 38.0* 31.9* 32.5*  PLT 169 123* 122* 143* 164  MCV 84.2 84.1 84.3 83.5 82.7  MCH 28.7 28.5 27.7 27.7 27.7  MCHC 34.1 33.9 32.9 33.2 33.5  RDW 13.7 13.7 13.7 13.8 13.9  LYMPHSABS 0.6*  --   --  1.0  --   MONOABS 0.8  --   --  0.7  --   EOSABS 0.0  --   --  0.1  --   BASOSABS 0.0  --   --  0.0  --     Chemistries   Recent Labs Lab 12/21/14 0900 12/22/14 0448 12/23/14 0530 12/24/14 0520 12/25/14 0525  NA 137 137 138 139 140  K 4.3 3.3* 3.2* 3.4* 3.7  CL 102 107 104 104 104  CO2 27 23 24 27 27   GLUCOSE 139* 137* 110* 115* 118*  BUN 19 11 8 7 10   CREATININE 1.41* 1.50* 1.37* 1.21 1.14  CALCIUM 8.9 7.6* 8.1* 7.9* 8.0*    CBG: No results for input(s): GLUCAP in the last 168 hours.  GFR Estimated Creatinine Clearance: 103.9 mL/min (by C-G formula based on Cr of 1.14).  Coagulation profile No results  for input(s): INR, PROTIME in the last 168 hours.  Cardiac Enzymes No results for input(s): CKMB, TROPONINI, MYOGLOBIN in the last 168 hours.  Invalid input(s): CK  Invalid input(s): POCBNP No results for input(s): DDIMER in the last 72 hours. No results for input(s): HGBA1C in the last 72 hours. No results for input(s): CHOL, HDL, LDLCALC, TRIG, CHOLHDL, LDLDIRECT in the last 72 hours. No results for input(s): TSH, T4TOTAL, T3FREE, THYROIDAB in the last 72 hours.  Invalid input(s): FREET3 No results  for input(s): VITAMINB12, FOLATE, FERRITIN, TIBC, IRON, RETICCTPCT in the last 72 hours. No results for input(s): LIPASE, AMYLASE in the last 72 hours.  Urine Studies No results for input(s): UHGB, CRYS in the last 72 hours.  Invalid input(s): UACOL, UAPR, USPG, UPH, UTP, UGL, UKET, UBIL, UNIT, UROB, ULEU, UEPI, UWBC, URBC, UBAC, CAST, UCOM, BILUA  MICROBIOLOGY: Recent Results (from the past 240 hour(s))  Culture, blood (Routine X 2) w Reflex to ID Panel     Status: None (Preliminary result)   Collection Time: 12/21/14  9:00 AM  Result Value Ref Range Status   Specimen Description BLOOD LEFT ANTECUBITAL  Final   Special Requests BOTTLES DRAWN AEROBIC AND ANAEROBIC 5MLS  Final   Culture NO GROWTH 4 DAYS  Final   Report Status PENDING  Incomplete  Culture, blood (Routine X 2) w Reflex to ID Panel     Status: None (Preliminary result)   Collection Time: 12/21/14 10:24 AM  Result Value Ref Range Status   Specimen Description BLOOD RIGHT HAND  Final   Special Requests BOTTLES DRAWN AEROBIC AND ANAEROBIC 5CCS  Final   Culture NO GROWTH 4 DAYS  Final   Report Status PENDING  Incomplete    RADIOLOGY STUDIES/RESULTS: Dg Chest 2 View  12/21/2014  CLINICAL DATA:  Cellulitis RIGHT lower leg, fever 103 degrees, cough EXAM: CHEST  2 VIEW COMPARISON:  None FINDINGS: Normal heart size, mediastinal contours, and pulmonary vascularity. Mild peribronchial thickening. No pulmonary infiltrate,  pleural effusion, or pneumothorax. RIGHT glenohumeral degenerative changes. Bones otherwise unremarkable. IMPRESSION: Mild bronchitic changes without infiltrate. Electronically Signed   By: Lavonia Dana M.D.   On: 12/21/2014 12:31   Ct Foot Right Wo Contrast  12/24/2014  CLINICAL DATA:  Enhancing right foot cellulitis with pain and swelling. EXAM: CT OF THE RIGHT FOOT WITHOUT CONTRAST TECHNIQUE: Multidetector CT imaging of the right foot was performed according to the standard protocol. Multiplanar CT image reconstructions were also generated. COMPARISON:  Radiographs 01/05/2013.  No recent studies. FINDINGS: There is moderate subcutaneous edema throughout the foot, greatest dorsally. No focal fluid collections are identified on noncontrast imaging. There is apparent skin blistering along the medial aspect of the hindfoot, measuring up to 3.3 cm in diameter. As evaluated by CT, the ankle tendons appear intact without significant tenosynovitis. There is no significant ankle joint effusion. There are no significant arthropathic changes other than mild degenerative changes at the first MTP joint. No evidence of acute fracture, dislocation or osteomyelitis. IMPRESSION: 1. Generalized subcutaneous edema throughout the foot consistent with given clinical impression of cellulitis. No focal abscess identified on noncontrast imaging. 2. Skin blister involving the medial hindfoot. 3. No evidence of osteomyelitis. Electronically Signed   By: Richardean Sale M.D.   On: 12/24/2014 19:48    Oren Binet, MD  [facility] Pager:[phone]  If 7PM-7AM, please contact night-coverage [email or web address] Password [email or web address] 12/25/2014, 2:55 PM   LOS: 4 days

## 2014-12-26 LAB — BASIC METABOLIC PANEL
Anion gap: 9 (ref 5–15)
BUN: 12 mg/dL (ref 6–20)
CHLORIDE: 107 mmol/L (ref 101–111)
CO2: 25 mmol/L (ref 22–32)
Calcium: 8.6 mg/dL — ABNORMAL LOW (ref 8.9–10.3)
Creatinine, Ser: 0.84 mg/dL (ref 0.61–1.24)
GFR calc Af Amer: >60 mL/min (ref >60)
GFR calc non Af Amer: >60 mL/min (ref >60)
GLUCOSE: 169 mg/dL — AB (ref 65–99)
POTASSIUM: 4.1 mmol/L (ref 3.5–5.1)
Sodium: 141 mmol/L (ref 135–145)

## 2014-12-26 LAB — CULTURE, BLOOD (ROUTINE X 2)
CULTURE: NO GROWTH
Culture: NO GROWTH

## 2014-12-26 LAB — SEDIMENTATION RATE: Sed Rate: 92 mm/hr — ABNORMAL HIGH (ref 0–16)

## 2014-12-26 LAB — CBC
HCT: 30.6 % — ABNORMAL LOW (ref 39.0–52.0)
HEMOGLOBIN: 10.4 g/dL — AB (ref 13.0–17.0)
MCH: 28 pg (ref 26.0–34.0)
MCHC: 34 g/dL (ref 30.0–36.0)
MCV: 82.3 fL (ref 78.0–100.0)
Platelets: 195 10*3/uL (ref 150–400)
RBC: 3.72 MIL/uL — AB (ref 4.22–5.81)
RDW: 14 % (ref 11.5–15.5)
WBC: 8.7 10*3/uL (ref 4.0–10.5)

## 2014-12-26 LAB — C-REACTIVE PROTEIN: CRP: 17.9 mg/dL — ABNORMAL HIGH (ref <1.0)

## 2014-12-26 MED ORDER — LINEZOLID 600 MG PO TABS
600.0000 mg | ORAL_TABLET | Freq: Two times a day (BID) | ORAL | Status: DC
  Administered 2014-12-26 – 2014-12-28 (×4): 600 mg via ORAL
  Filled 2014-12-26 (×5): qty 1

## 2014-12-26 MED ORDER — POLYETHYLENE GLYCOL 3350 17 G PO PACK
17.0000 g | PACK | Freq: Every day | ORAL | Status: DC
Start: 2014-12-26 — End: 2014-12-28
  Administered 2014-12-26 – 2014-12-27 (×3): 17 g via ORAL
  Filled 2014-12-26 (×4): qty 1

## 2014-12-26 MED ORDER — SENNA 8.6 MG PO TABS: 2.0000 | ORAL_TABLET | Freq: Every day | ORAL | Status: DC | PRN

## 2014-12-26 NOTE — Progress Notes (Signed)
Subjective: PT doing wellwith no problems  Objective: Vital signs in last 24 hours: Temp:  [98.5 F (36.9 C)-101 F (38.3 C)] 98.5 F (36.9 C) (12/19 2136) Pulse Rate:  [62-78] 62 (12/19 2136) Resp:  [18-20] 18 (12/19 2136) BP: (130-135)/(75-85) 135/85 mmHg (12/19 2136) SpO2:  [95 %-96 %] 96 % (12/19 2136) Last BM Date: 12/20/14  Intake/Output from previous day: 12/19 0701 - 12/20 0700 In: 3337 [P.O.:422; I.V.:2265; IV Piggyback:650] Out: [id number] [Urine:4350] Intake/Output this shift: Total I/O In: -  Out: 350 [Urine:350]  Incision/Wound: Bx site c/d/i, cellulitis to RLE improved  Lab Results:   Recent Labs  12/25/14 0525 12/26/14 0538  WBC 8.1 8.7  HGB 10.9* 10.4*  HCT 32.5* 30.6*  PLT 164 195   BMET  Recent Labs  12/25/14 0525 12/26/14 0538  NA 140 141  K 3.7 4.1  CL 104 107  CO2 27 25  GLUCOSE 118* 169*  BUN 10 12  CREATININE 1.14 0.84  CALCIUM 8.0* 8.6*   PT/INR No results for input(s): LABPROT, INR in the last 72 hours. ABG No results for input(s): PHART, HCO3 in the last 72 hours.  Invalid input(s): PCO2, PO2  Studies/Results: Ct Foot Right Wo Contrast  12/24/2014  CLINICAL DATA:  Enhancing right foot cellulitis with pain and swelling. EXAM: CT OF THE RIGHT FOOT WITHOUT CONTRAST TECHNIQUE: Multidetector CT imaging of the right foot was performed according to the standard protocol. Multiplanar CT image reconstructions were also generated. COMPARISON:  Radiographs 01/05/2013.  No recent studies. FINDINGS: There is moderate subcutaneous edema throughout the foot, greatest dorsally. No focal fluid collections are identified on noncontrast imaging. There is apparent skin blistering along the medial aspect of the hindfoot, measuring up to 3.3 cm in diameter. As evaluated by CT, the ankle tendons appear intact without significant tenosynovitis. There is no significant ankle joint effusion. There are no significant arthropathic changes other than mild  degenerative changes at the first MTP joint. No evidence of acute fracture, dislocation or osteomyelitis. IMPRESSION: 1. Generalized subcutaneous edema throughout the foot consistent with given clinical impression of cellulitis. No focal abscess identified on noncontrast imaging. 2. Skin blister involving the medial hindfoot. 3. No evidence of osteomyelitis. Electronically Signed   By: Richardean Sale M.D.   On: 12/24/2014 19:48    Anti-infectives: Anti-infectives    Start     Dose/Rate Route Frequency Ordered Stop   12/24/14 1400  clindamycin (CLEOCIN) IVPB 600 mg     600 mg 100 mL/hr over 30 Minutes Intravenous 3 times per day 12/24/14 1017     12/23/14 1100  ceftaroline (TEFLARO) 600 mg in sodium chloride 0.9 % 250 mL IVPB     600 mg 250 mL/hr over 60 Minutes Intravenous Every 12 hours 12/23/14 1000     12/22/14 1330  vancomycin (VANCOCIN) IVPB 750 mg/150 ml premix  Status:  Discontinued     750 mg 150 mL/hr over 60 Minutes Intravenous Every 12 hours 12/22/14 1254 12/23/14 1000   12/21/14 2200  vancomycin (VANCOCIN) IVPB 750 mg/150 ml premix  Status:  Discontinued     750 mg 150 mL/hr over 60 Minutes Intravenous Every 12 hours 12/21/14 1046 12/22/14 1254   12/21/14 1700  piperacillin-tazobactam (ZOSYN) IVPB 3.375 g  Status:  Discontinued     3.375 g 12.5 mL/hr over 240 Minutes Intravenous 3 times per day 12/21/14 1059 12/23/14 1000   12/21/14 1045  piperacillin-tazobactam (ZOSYN) IVPB 3.375 g     3.375 g 100 mL/hr over 30  Minutes Intravenous  Once 12/21/14 1041 12/21/14 1334   12/21/14 1045  vancomycin (VANCOCIN) IVPB 1000 mg/200 mL premix  Status:  Discontinued     1,000 mg 200 mL/hr over 60 Minutes Intravenous  Once 12/21/14 1041 12/21/14 1043   12/21/14 0930  vancomycin (VANCOCIN) 2,500 mg in sodium chloride 0.9 % 500 mL IVPB     2,500 mg 250 mL/hr over 120 Minutes Intravenous  Once 12/21/14 0849 12/21/14 1240      Assessment/Plan: RLE cellulitis with progressive rash, now  with ecchymosis and bullous formation to the lower foot and leg. SIRS Acute renal failure improving Body mass index is 35.25 kg/(m^2).  Prior hx of cocaine use (6 weeks ago)  Bx sites look good Path pending    LOS: 5 days    Rosario Jacks., Anne Hahn 12/26/2014

## 2014-12-26 NOTE — Progress Notes (Signed)
PATIENT DETAILS Name: Benjamin Mcdowell Age: 49 y.o. Sex: male Date of Birth: Aug 01, 1965 Admit Date: 12/21/2014 Admitting Physician Elmarie Shiley, MD PCP:[name], Nickola Major., DO  Subjective: Foot essentially the same. Still febrile-but curve slightly better  Assessment/Plan: Principal Problem: Sepsis: Continues to be febrile-but fever curve is definitely better. No further leukocytosis. Continue with Teflaro and clindamycin, along with IV Solu-Medrol.  Blood cultures continue to be negative   Active Problems: Cellulitis of right lower extremity: Right foot/leg essentially unchanged-still with significant ecchymosis/blistering. Not sure if this is soft tissue infection or a vasculitis.Abx changed to Teflaro on 12/17 (previously on Vanc/Zosyn), clindamycin added on 12/18. After discussion with ID and after obtaining a punch biopsy, steroids were started on 12/19.  CT of the right leg negative for deep infection, Doppler negative for DVT. We will get a wound care-as some blisters are now rupturing.   (Note- No dermatology service at this facility)  ARF: Prerenal azotemia secondary to sepsis. Resolved with IVF.  Obesity, Class II, BMI 35-39.9: Counseled regarding importance of weight loss  Mild hyperglycemia-A1c 5.6. Monitor for now  Disposition: Remain inpatient  Antimicrobial agents  See below  Anti-infectives    Start     Dose/Rate Route Frequency Ordered Stop   12/24/14 1400  clindamycin (CLEOCIN) IVPB 600 mg     600 mg 100 mL/hr over 30 Minutes Intravenous 3 times per day 12/24/14 1017     12/23/14 1100  ceftaroline (TEFLARO) 600 mg in sodium chloride 0.9 % 250 mL IVPB     600 mg 250 mL/hr over 60 Minutes Intravenous Every 12 hours 12/23/14 1000     12/22/14 1330  vancomycin (VANCOCIN) IVPB 750 mg/150 ml premix  Status:  Discontinued     750 mg 150 mL/hr over 60 Minutes Intravenous Every 12 hours 12/22/14 1254 12/23/14 1000   12/21/14 2200  vancomycin  (VANCOCIN) IVPB 750 mg/150 ml premix  Status:  Discontinued     750 mg 150 mL/hr over 60 Minutes Intravenous Every 12 hours 12/21/14 1046 12/22/14 1254   12/21/14 1700  piperacillin-tazobactam (ZOSYN) IVPB 3.375 g  Status:  Discontinued     3.375 g 12.5 mL/hr over 240 Minutes Intravenous 3 times per day 12/21/14 1059 12/23/14 1000   12/21/14 1045  piperacillin-tazobactam (ZOSYN) IVPB 3.375 g     3.375 g 100 mL/hr over 30 Minutes Intravenous  Once 12/21/14 1041 12/21/14 1334   12/21/14 1045  vancomycin (VANCOCIN) IVPB 1000 mg/200 mL premix  Status:  Discontinued     1,000 mg 200 mL/hr over 60 Minutes Intravenous  Once 12/21/14 1041 12/21/14 1043   12/21/14 0930  vancomycin (VANCOCIN) 2,500 mg in sodium chloride 0.9 % 500 mL IVPB     2,500 mg 250 mL/hr over 120 Minutes Intravenous  Once 12/21/14 0849 12/21/14 1240      DVT Prophylaxis: Prophylactic Lovenox  Code Status: Full code   Family Communication None at bedside  Procedures: None  CONSULTS:  ID  Time spent 30 minutes-Greater than 50% of this time was spent in counseling, explanation of diagnosis, planning of further management, and coordination of care.  MEDICATIONS: Scheduled Meds: . ceFTAROline (TEFLARO) IV  600 mg Intravenous Q12H  . clindamycin (CLEOCIN) IV  600 mg Intravenous 3 times per day  . enoxaparin (LOVENOX) injection  55 mg Subcutaneous Q24H  . lidocaine-EPINEPHrine  20 mL Infiltration Once  . methylPREDNISolone (SOLU-MEDROL) injection  60 mg  Intravenous Q12H  . ondansetron (ZOFRAN) IV  4 mg Intravenous Once  . sodium chloride  500 mL Intravenous Once   Continuous Infusions: . sodium chloride 100 mL/hr at 12/26/14 0620   PRN Meds:.acetaminophen **OR** acetaminophen, lidocaine, morphine injection, oxyCODONE    PHYSICAL EXAM: Vital signs in last 24 hours: Filed Vitals:   12/25/14 0551 12/25/14 1448 12/25/14 1459 12/25/14 2136  BP: 138/83  130/75 135/85  Pulse: 79  78 62  Temp: 98.9 F (37.2  C) 99.8 F (37.7 C) 101 F (38.3 C) 98.5 F (36.9 C)  TempSrc: Oral Oral Oral Oral  Resp: 16  20 18   Height:      Weight:      SpO2: 96%  95% 96%    Weight change:  Filed Weights   12/21/14 0819 12/21/14 1704  Weight: 117.935 kg (260 lb) 117.935 kg (260 lb)   Body mass index is 35.25 kg/(m^2).   Gen Exam: Awake and alert with clear speech. Neck: Supple, No JVD.   Chest: B/L Clear.  No rhonchi CVS: S1 S2 Regular, no murmurs.  Abdomen: soft, BS +, non tender, non distended.  Extremities: Right lower (see below pic-taken today). Left leg at usual baseline                 Neuro:non focal  Intake/Output from previous day:  Intake/Output Summary (Last 24 hours) at 12/26/14 1442 Last data filed at 12/26/14 [id number]  Gross per 24 hour  Intake   3337 ml  Output   4100 ml  Net   -763 ml     LAB RESULTS: CBC  Recent Labs Lab 12/21/14 0900 12/22/14 0448 12/23/14 0530 12/24/14 0520 12/25/14 0525 12/26/14 0538  WBC 14.3* 8.7 8.0 8.4 8.1 8.7  HGB 14.7 12.4* 12.5* 10.6* 10.9* 10.4*  HCT 43.1 36.6* 38.0* 31.9* 32.5* 30.6*  PLT 169 123* 122* 143* 164 195  MCV 84.2 84.1 84.3 83.5 82.7 82.3  MCH 28.7 28.5 27.7 27.7 27.7 28.0  MCHC 34.1 33.9 32.9 33.2 33.5 34.0  RDW 13.7 13.7 13.7 13.8 13.9 14.0  LYMPHSABS 0.6*  --   --  1.0  --   --   MONOABS 0.8  --   --  0.7  --   --   EOSABS 0.0  --   --  0.1  --   --   BASOSABS 0.0  --   --  0.0  --   --     Chemistries   Recent Labs Lab 12/22/14 0448 12/23/14 0530 12/24/14 0520 12/25/14 0525 12/26/14 0538  NA 137 138 139 140 141  K 3.3* 3.2* 3.4* 3.7 4.1  CL 107 104 104 104 107  CO2 23 24 27 27 25   GLUCOSE 137* 110* 115* 118* 169*  BUN 11 8 7 10 12   CREATININE 1.50* 1.37* 1.21 1.14 0.84  CALCIUM 7.6* 8.1* 7.9* 8.0* 8.6*    CBG: No results for input(s): GLUCAP in the last 168 hours.  GFR Estimated Creatinine Clearance: 141 mL/min (by C-G formula based on Cr of 0.84).  Coagulation profile No results  for input(s): INR, PROTIME in the last 168 hours.  Cardiac Enzymes No results for input(s): CKMB, TROPONINI, MYOGLOBIN in the last 168 hours.  Invalid input(s): CK  Invalid input(s): POCBNP No results for input(s): DDIMER in the last 72 hours. No results for input(s): HGBA1C in the last 72 hours. No results for input(s): CHOL, HDL, LDLCALC, TRIG, CHOLHDL, LDLDIRECT in the last 72 hours. No results  for input(s): TSH, T4TOTAL, T3FREE, THYROIDAB in the last 72 hours.  Invalid input(s): FREET3 No results for input(s): VITAMINB12, FOLATE, FERRITIN, TIBC, IRON, RETICCTPCT in the last 72 hours. No results for input(s): LIPASE, AMYLASE in the last 72 hours.  Urine Studies No results for input(s): UHGB, CRYS in the last 72 hours.  Invalid input(s): UACOL, UAPR, USPG, UPH, UTP, UGL, UKET, UBIL, UNIT, UROB, ULEU, UEPI, UWBC, URBC, UBAC, CAST, UCOM, BILUA  MICROBIOLOGY: Recent Results (from the past 240 hour(s))  Culture, blood (Routine X 2) w Reflex to ID Panel     Status: None   Collection Time: 12/21/14  9:00 AM  Result Value Ref Range Status   Specimen Description BLOOD LEFT ANTECUBITAL  Final   Special Requests BOTTLES DRAWN AEROBIC AND ANAEROBIC 5MLS  Final   Culture NO GROWTH 5 DAYS  Final   Report Status 12/26/2014 FINAL  Final  Culture, blood (Routine X 2) w Reflex to ID Panel     Status: None   Collection Time: 12/21/14 10:24 AM  Result Value Ref Range Status   Specimen Description BLOOD RIGHT HAND  Final   Special Requests BOTTLES DRAWN AEROBIC AND ANAEROBIC 5CCS  Final   Culture NO GROWTH 5 DAYS  Final   Report Status 12/26/2014 FINAL  Final    RADIOLOGY STUDIES/RESULTS: Dg Chest 2 View  12/21/2014  CLINICAL DATA:  Cellulitis RIGHT lower leg, fever 103 degrees, cough EXAM: CHEST  2 VIEW COMPARISON:  None FINDINGS: Normal heart size, mediastinal contours, and pulmonary vascularity. Mild peribronchial thickening. No pulmonary infiltrate, pleural effusion, or pneumothorax.  RIGHT glenohumeral degenerative changes. Bones otherwise unremarkable. IMPRESSION: Mild bronchitic changes without infiltrate. Electronically Signed   By: Lavonia Dana M.D.   On: 12/21/2014 12:31   Ct Foot Right Wo Contrast  12/24/2014  CLINICAL DATA:  Enhancing right foot cellulitis with pain and swelling. EXAM: CT OF THE RIGHT FOOT WITHOUT CONTRAST TECHNIQUE: Multidetector CT imaging of the right foot was performed according to the standard protocol. Multiplanar CT image reconstructions were also generated. COMPARISON:  Radiographs 01/05/2013.  No recent studies. FINDINGS: There is moderate subcutaneous edema throughout the foot, greatest dorsally. No focal fluid collections are identified on noncontrast imaging. There is apparent skin blistering along the medial aspect of the hindfoot, measuring up to 3.3 cm in diameter. As evaluated by CT, the ankle tendons appear intact without significant tenosynovitis. There is no significant ankle joint effusion. There are no significant arthropathic changes other than mild degenerative changes at the first MTP joint. No evidence of acute fracture, dislocation or osteomyelitis. IMPRESSION: 1. Generalized subcutaneous edema throughout the foot consistent with given clinical impression of cellulitis. No focal abscess identified on noncontrast imaging. 2. Skin blister involving the medial hindfoot. 3. No evidence of osteomyelitis. Electronically Signed   By: Richardean Sale M.D.   On: 12/24/2014 19:48    Oren Binet, MD  [facility] Pager:[phone]  If 7PM-7AM, please contact night-coverage [email or web address] Password [email or web address] 12/26/2014, 2:42 PM   LOS: 5 days

## 2014-12-26 NOTE — Progress Notes (Addendum)
[facility]      Subjective: Patient feels his foot appears the same compared to yesterday, maybe some slight improvement in swelling and erythema. He had another fever yesterday, but feels these are becoming less frequent. Objective: Vital signs in last 24 hours: Filed Vitals:   12/25/14 0551 12/25/14 1448 12/25/14 1459 12/25/14 2136  BP: 138/83  130/75 135/85  Pulse: 79  78 62  Temp: 98.9 F (37.2 C) 99.8 F (37.7 C) 101 F (38.3 C) 98.5 F (36.9 C)  TempSrc: Oral Oral Oral Oral  Resp: 16  20 18   Height:      Weight:      SpO2: 96%  95% 96%   General: resting in bed Cardiac: RRR, no rubs, murmurs or gallops Pulm: clear to auscultation bilaterally, moving normal volumes of air Abd: soft, nontender, nondistended, BS present Ext: Right foot wrapped, similar appearance to yesterday with multiple gray/purplish bullae at foot and ankle with skin thickening at anterior shin. Erythema is reduced and some slight improvement in edema.   Lab Results: Basic Metabolic Panel:  Recent Labs Lab 12/25/14 0525 12/26/14 0538  NA 140 141  K 3.7 4.1  CL 104 107  CO2 27 25  GLUCOSE 118* 169*  BUN 10 12  CREATININE 1.14 0.84  CALCIUM 8.0* 8.6*   No results for input(s): LIPASE, AMYLASE in the last 168 hours. No results for input(s): AMMONIA in the last 168 hours. CBC:  Recent Labs Lab 12/21/14 0900  12/24/14 0520 12/25/14 0525 12/26/14 0538  WBC 14.3*  < > 8.4 8.1 8.7  NEUTROABS 12.9*  --  6.6  --   --   HGB 14.7  < > 10.6* 10.9* 10.4*  HCT 43.1  < > 31.9* 32.5* 30.6*  MCV 84.2  < > 83.5 82.7 82.3  PLT 169  < > 143* 164 195  < > = values in this interval not displayed.  Micro Results: Recent Results (from the past 240 hour(s))  Culture, blood (Routine X 2) w Reflex to ID Panel     Status: None (Preliminary result)   Collection Time: 12/21/14  9:00 AM  Result Value Ref Range Status   Specimen Description BLOOD LEFT ANTECUBITAL  Final     Special Requests BOTTLES DRAWN AEROBIC AND ANAEROBIC 5MLS  Final   Culture NO GROWTH 4 DAYS  Final   Report Status PENDING  Incomplete  Culture, blood (Routine X 2) w Reflex to ID Panel     Status: None (Preliminary result)   Collection Time: 12/21/14 10:24 AM  Result Value Ref Range Status   Specimen Description BLOOD RIGHT HAND  Final   Special Requests BOTTLES DRAWN AEROBIC AND ANAEROBIC 5CCS  Final   Culture NO GROWTH 4 DAYS  Final   Report Status PENDING  Incomplete   Studies/Results: Ct Foot Right Wo Contrast  12/24/2014  CLINICAL DATA:  Enhancing right foot cellulitis with pain and swelling. EXAM: CT OF THE RIGHT FOOT WITHOUT CONTRAST TECHNIQUE: Multidetector CT imaging of the right foot was performed according to the standard protocol. Multiplanar CT image reconstructions were also generated. COMPARISON:  Radiographs 01/05/2013.  No recent studies. FINDINGS: There is moderate subcutaneous edema throughout the foot, greatest dorsally. No focal fluid collections are identified on noncontrast imaging. There is apparent skin blistering along the medial aspect of the hindfoot, measuring up to 3.3 cm in diameter. As evaluated by CT, the ankle tendons appear intact without significant tenosynovitis. There is no significant ankle  joint effusion. There are no significant arthropathic changes other than mild degenerative changes at the first MTP joint. No evidence of acute fracture, dislocation or osteomyelitis. IMPRESSION: 1. Generalized subcutaneous edema throughout the foot consistent with given clinical impression of cellulitis. No focal abscess identified on noncontrast imaging. 2. Skin blister involving the medial hindfoot. 3. No evidence of osteomyelitis. Electronically Signed   By: Richardean Sale M.D.   On: 12/24/2014 19:48   Scheduled Meds: . ceFTAROline (TEFLARO) IV  600 mg Intravenous Q12H  . clindamycin (CLEOCIN) IV  600 mg Intravenous 3 times per day  . enoxaparin (LOVENOX)  injection  55 mg Subcutaneous Q24H  . lidocaine-EPINEPHrine  20 mL Infiltration Once  . methylPREDNISolone (SOLU-MEDROL) injection  60 mg Intravenous Q12H  . ondansetron (ZOFRAN) IV  4 mg Intravenous Once  . sodium chloride  500 mL Intravenous Once   Continuous Infusions: . sodium chloride 100 mL/hr at 12/26/14 0620   PRN Meds:.acetaminophen **OR** acetaminophen, lidocaine, morphine injection, oxyCODONE Assessment/Plan: Principal Problem:   Cellulitis of right lower extremity Active Problems:   CHRONIC Edema of right lower extremity   Acute renal failure (ARF) ([place])   SIRS with acute organ dysfunction due to infectious process (HCC)   Acute hyperglycemia   Obesity, Class II, BMI 35-39.9, isolated (HCC)   Cellulitis of right leg   Purpura (HCC)   Vasculitis of skin   Sepsis [facility])  [name] is a 50 year old male with PMH of chronic right lower extremity edema with recurrent cellulitis and Obesity who is admitted for RLE cellulitis and SIRS presentation.   Total days antibiotics: 5 2 Days Vanc/Zosyn: started 12/15, d/c'd 12/17 Day 4 Ceftaroline: started 12/17 Day 3 Clindamycin: started 12/18  Cellulitis of RLE/foot: Similar appearance to yesterday. Likely cellulitis exacerbated by an autoimmune or vasculitic process. Patient admits to prior cocaine use, last use about 6 weeks ago. CT imaging without evidence for osteomyelitis (MRI not done due to penile prosthesis). Blood cultures negative. Biopsies collected on 12/19. On day 4 of Ceftaroline for MRSA and Strep coverage. He was started on IV Solu-Medrol 60 mg q12h on 12/25/14. -Continue Ceftaroline and Clindamycin -f/u pathology -monitor fevers -In future, patient may benefit from long-term or home supply of antibiotics to use when he feels his cellulitis is recurring   LOS: 5 days   Zada Finders, MD 12/26/2014, 9:25 AM  INFECTIOUS DISEASE ATTENDING ADDENDUM:     [facility]   Date:  12/26/2014  Patient name: Benjamin Mcdowell  Medical record number: [id number]  Date of birth: 03-Jun-1965    This patient has been seen and discussed with the house staff. Please see their note for complete details. I concur with their findings with the following additions/corrections:  His bullae are still progressing but erythema from initial cellulitis is better  I wil change him to oral zyvox which will have same spectrum as his prior regimen and have toxin inhibitory effect as well by inhibiting ribosomal protein synthesis  Rhina Brackett Dam 12/26/2014, 5:20 PM

## 2014-12-27 ENCOUNTER — Encounter: Payer: Managed Care, Other (non HMO) | Admitting: Family Medicine

## 2014-12-27 DIAGNOSIS — L270 Generalized skin eruption due to drugs and medicaments taken internally: Secondary | ICD-10-CM | POA: Insufficient documentation

## 2014-12-27 DIAGNOSIS — T50995A Adverse effect of other drugs, medicaments and biological substances, initial encounter: Secondary | ICD-10-CM

## 2014-12-27 DIAGNOSIS — R739 Hyperglycemia, unspecified: Secondary | ICD-10-CM

## 2014-12-27 DIAGNOSIS — L52 Erythema nodosum: Secondary | ICD-10-CM | POA: Insufficient documentation

## 2014-12-27 LAB — MPO/PR-3 (ANCA) ANTIBODIES

## 2014-12-27 LAB — CBC
HEMATOCRIT: 35.7 % — AB (ref 39.0–52.0)
Hemoglobin: 12.1 g/dL — ABNORMAL LOW (ref 13.0–17.0)
MCH: 28.2 pg (ref 26.0–34.0)
MCHC: 33.9 g/dL (ref 30.0–36.0)
MCV: 83.2 fL (ref 78.0–100.0)
PLATELETS: 257 10*3/uL (ref 150–400)
RBC: 4.29 MIL/uL (ref 4.22–5.81)
RDW: 14.3 % (ref 11.5–15.5)
WBC: 10.6 10*3/uL — ABNORMAL HIGH (ref 4.0–10.5)

## 2014-12-27 LAB — BASIC METABOLIC PANEL
Anion gap: 9 (ref 5–15)
BUN: 17 mg/dL (ref 6–20)
CO2: 25 mmol/L (ref 22–32)
Calcium: 8.7 mg/dL — ABNORMAL LOW (ref 8.9–10.3)
Chloride: 108 mmol/L (ref 101–111)
Creatinine, Ser: 0.95 mg/dL (ref 0.61–1.24)
GFR calc Af Amer: >60 mL/min (ref >60)
GLUCOSE: 147 mg/dL — AB (ref 65–99)
POTASSIUM: 4 mmol/L (ref 3.5–5.1)
Sodium: 142 mmol/L (ref 135–145)

## 2014-12-27 LAB — HEPATITIS B SURFACE ANTIGEN: HEP B S AG: NEGATIVE

## 2014-12-27 LAB — HEPATITIS B SURFACE ANTIBODY, QUANTITATIVE: Hepatitis B-Post: 77.4 m[IU]/mL (ref >9.9)

## 2014-12-27 LAB — HEPATITIS C ANTIBODY (REFLEX): HCV Ab: <0.1 s/co ratio (ref 0.0–0.9)

## 2014-12-27 LAB — HCV COMMENT:

## 2014-12-27 MED ORDER — LINEZOLID 600 MG PO TABS: 600.0000 mg | ORAL_TABLET | Freq: Two times a day (BID) | ORAL | Status: DC

## 2014-12-27 NOTE — Progress Notes (Signed)
[facility]      Subjective: Patient had rupturing of some of his blisters. WOC saw patient today and removed layers of loose skin and dressed RLE. Patient was afebrile overnight.  Objective: Vital signs in last 24 hours: Filed Vitals:   12/25/14 2136 12/26/14 1510 12/26/14 2219 12/27/14 0616  BP: 135/85 138/82 145/93 138/89  Pulse: 62 82 55 65  Temp: 98.5 F (36.9 C) 98.6 F (37 C) 98.5 F (36.9 C) 98.1 F (36.7 C)  TempSrc: Oral Oral Oral Oral  Resp: 18 19 20 20   Height:      Weight:      SpO2: 96% 95% 97% 95%   General: resting in bed Ext: Right foot wrapped, decreased erythema. Patient showed pictures on his phone of ruptured bullae and appears underlying erythema of foot is improved.      Lab Results: Basic Metabolic Panel:  Recent Labs Lab 12/26/14 0538 12/27/14 0615  NA 141 142  K 4.1 4.0  CL 107 108  CO2 25 25  GLUCOSE 169* 147*  BUN 12 17  CREATININE 0.84 0.95  CALCIUM 8.6* 8.7*   No results for input(s): LIPASE, AMYLASE in the last 168 hours. No results for input(s): AMMONIA in the last 168 hours. CBC:  Recent Labs Lab 12/21/14 0900  12/24/14 0520  12/26/14 0538 12/27/14 0615  WBC 14.3*  < > 8.4  < > 8.7 10.6*  NEUTROABS 12.9*  --  6.6  --   --   --   HGB 14.7  < > 10.6*  < > 10.4* 12.1*  HCT 43.1  < > 31.9*  < > 30.6* 35.7*  MCV 84.2  < > 83.5  < > 82.3 83.2  PLT 169  < > 143*  < > 195 257  < > = values in this interval not displayed.  Micro Results: Recent Results (from the past 240 hour(s))  Culture, blood (Routine X 2) w Reflex to ID Panel     Status: None   Collection Time: 12/21/14  9:00 AM  Result Value Ref Range Status   Specimen Description BLOOD LEFT ANTECUBITAL  Final   Special Requests BOTTLES DRAWN AEROBIC AND ANAEROBIC 5MLS  Final   Culture NO GROWTH 5 DAYS  Final   Report Status 12/26/2014 FINAL  Final  Culture, blood (Routine X 2) w Reflex to ID Panel     Status: None   Collection  Time: 12/21/14 10:24 AM  Result Value Ref Range Status   Specimen Description BLOOD RIGHT HAND  Final   Special Requests BOTTLES DRAWN AEROBIC AND ANAEROBIC 5CCS  Final   Culture NO GROWTH 5 DAYS  Final   Report Status 12/26/2014 FINAL  Final   Studies/Results: No results found. Scheduled Meds: . enoxaparin (LOVENOX) injection  55 mg Subcutaneous Q24H  . lidocaine-EPINEPHrine  20 mL Infiltration Once  . linezolid  600 mg Oral Q12H  . methylPREDNISolone (SOLU-MEDROL) injection  60 mg Intravenous Q12H  . ondansetron (ZOFRAN) IV  4 mg Intravenous Once  . polyethylene glycol  17 g Oral Daily  . sodium chloride  500 mL Intravenous Once   Continuous Infusions:   PRN Meds:.acetaminophen **OR** acetaminophen, lidocaine, morphine injection, oxyCODONE, senna Assessment/Plan: Principal Problem:   Cellulitis of right lower extremity Active Problems:   CHRONIC Edema of right lower extremity   Acute renal failure (ARF) ([place])   SIRS with acute organ dysfunction due to infectious process ([place])   Acute hyperglycemia   Obesity,  Class II, BMI 35-39.9, isolated (HCC)   Cellulitis of right leg   Purpura (HCC)   Vasculitis of skin   Sepsis [facility])  [name] is a 49 year old male with PMH of chronic right lower extremity edema with recurrent cellulitis and Obesity who is admitted for RLE cellulitis and SIRS presentation.   Total days antibiotics: 6 2 Days Vanc/Zosyn: started 12/15, d/c'd 12/17 4 Days Ceftaroline: started 12/17, d/c'd 12/20 3 Days Clindamycin: started 12/18, d/c'd 12/20 Day 2 Zyvox: started 12/20  Cellulitis of RLE/foot: Had rupture of bullae with reported yellow gelatinous fluid. Wound Care saw and removed loose tissue and dressed RLE. Likely cellulitis exacerbated by an autoimmune or vasculitic process. Patient admits to prior cocaine use, last use about 6 weeks ago. CT imaging without evidence for osteomyelitis (MRI not done due to penile prosthesis). Blood cultures  negative. Biopsies collected on 12/19. Switched to Zyvox on 12/20. Can likely go home tomorrow with home wound care. -Continue steroids for about 1 week (he will need to be on antibiotics during and for awhile after steroid therapy) -Continue Zyvox 600 mg BID for 2 weeks after discharge (if cost is an issue can consider combination of Doxycycline with either Keflex or Amoxicillin) -Discussed with pathology who reviewed sample. Do not see evidence of vasculitis, but do see subepidermal edema and eosinophils which suggest a drug eruption. Immunofluorescence was negative. -In future, patient may benefit from long-term or home supply of antibiotics to use when he feels his cellulitis is recurring (Keflex or Amoxiciliin for example)   LOS: 6 days   Zada Finders, MD 12/27/2014, 11:45 AM

## 2014-12-27 NOTE — Progress Notes (Signed)
PATIENT DETAILS Name: Benjamin Mcdowell Age: 49 y.o. Sex: male Date of Birth: May 25, 1965 Admit Date: 12/21/2014 Admitting Physician Elmarie Shiley, MD PCP:[name], Nickola Major., DO  Subjective: Foot essentially the same.However no fever. Less pain in right foot-"not as tight as before"  Assessment/Plan: Principal Problem: Sepsis: Sepsis pathophysiology resolved with empiric antibiotics and steroids. Antibiotics now narrowed just to Zyvox. Blood cultures negative.   Active Problems: Cellulitis of right lower extremity versus vasculitis: Right foot/leg may be slightly better today, less erythema, ecchymosis and blistering remain unchanged. Biopsy shows 2 different processes-superficial layers consistent with a hypersensitivity reaction, deeper layers suggestive of erythema nodosum. Spoke with infectious disease-Dr. Tommy Medal- plans are to complete 2 weeks of antibiotic on discharge  and taper steroids over the course of 10 days .CT of the right leg negative for deep infection, Doppler negative for DVT. We will get a wound care-as some blisters are now rupturing.   (Note- No dermatology service at this facility)  ARF: Prerenal azotemia secondary to sepsis. Resolved with IVF.  Obesity, Class II, BMI 35-39.9: Counseled regarding importance of weight loss  Mild hyperglycemia-A1c 5.6. Monitor for now  Disposition: Remain inpatient-suspect home on 12/22   Antimicrobial agents  See below  Anti-infectives    Start     Dose/Rate Route Frequency Ordered Stop   12/26/14 2200  linezolid (ZYVOX) tablet 600 mg     600 mg Oral Every 12 hours 12/26/14 1720     12/24/14 1400  clindamycin (CLEOCIN) IVPB 600 mg  Status:  Discontinued     600 mg 100 mL/hr over 30 Minutes Intravenous 3 times per day 12/24/14 1017 12/26/14 1720   12/23/14 1100  ceftaroline (TEFLARO) 600 mg in sodium chloride 0.9 % 250 mL IVPB  Status:  Discontinued     600 mg 250 mL/hr over 60 Minutes Intravenous Every 12  hours 12/23/14 1000 12/26/14 1720   12/22/14 1330  vancomycin (VANCOCIN) IVPB 750 mg/150 ml premix  Status:  Discontinued     750 mg 150 mL/hr over 60 Minutes Intravenous Every 12 hours 12/22/14 1254 12/23/14 1000   12/21/14 2200  vancomycin (VANCOCIN) IVPB 750 mg/150 ml premix  Status:  Discontinued     750 mg 150 mL/hr over 60 Minutes Intravenous Every 12 hours 12/21/14 1046 12/22/14 1254   12/21/14 1700  piperacillin-tazobactam (ZOSYN) IVPB 3.375 g  Status:  Discontinued     3.375 g 12.5 mL/hr over 240 Minutes Intravenous 3 times per day 12/21/14 1059 12/23/14 1000   12/21/14 1045  piperacillin-tazobactam (ZOSYN) IVPB 3.375 g     3.375 g 100 mL/hr over 30 Minutes Intravenous  Once 12/21/14 1041 12/21/14 1334   12/21/14 1045  vancomycin (VANCOCIN) IVPB 1000 mg/200 mL premix  Status:  Discontinued     1,000 mg 200 mL/hr over 60 Minutes Intravenous  Once 12/21/14 1041 12/21/14 1043   12/21/14 0930  vancomycin (VANCOCIN) 2,500 mg in sodium chloride 0.9 % 500 mL IVPB     2,500 mg 250 mL/hr over 120 Minutes Intravenous  Once 12/21/14 0849 12/21/14 1240      DVT Prophylaxis: Prophylactic Lovenox  Code Status: Full code   Family Communication None at bedside  Procedures: None  CONSULTS:  ID  Time spent 30 minutes-Greater than 50% of this time was spent in counseling, explanation of diagnosis, planning of further management, and coordination of care.  MEDICATIONS: Scheduled Meds: . enoxaparin (LOVENOX) injection  55 mg Subcutaneous Q24H  . lidocaine-EPINEPHrine  20 mL Infiltration Once  . linezolid  600 mg Oral Q12H  . methylPREDNISolone (SOLU-MEDROL) injection  60 mg Intravenous Q12H  . ondansetron (ZOFRAN) IV  4 mg Intravenous Once  . polyethylene glycol  17 g Oral Daily  . sodium chloride  500 mL Intravenous Once   Continuous Infusions:   PRN Meds:.acetaminophen **OR** acetaminophen, lidocaine, morphine injection, oxyCODONE, senna    PHYSICAL EXAM: Vital signs in  last 24 hours: Filed Vitals:   12/25/14 2136 12/26/14 1510 12/26/14 2219 12/27/14 0616  BP: 135/85 138/82 145/93 138/89  Pulse: 62 82 55 65  Temp: 98.5 F (36.9 C) 98.6 F (37 C) 98.5 F (36.9 C) 98.1 F (36.7 C)  TempSrc: Oral Oral Oral Oral  Resp: 18 19 20 20   Height:      Weight:      SpO2: 96% 95% 97% 95%    Weight change:  Filed Weights   12/21/14 0819 12/21/14 1704  Weight: 117.935 kg (260 lb) 117.935 kg (260 lb)   Body mass index is 35.25 kg/(m^2).   Gen Exam: Awake and alert with clear speech. Neck: Supple, No JVD.   Chest: B/L Clear.  No rhonchi CVS: S1 S2 Regular, no murmurs.  Abdomen: soft, BS +, non tender, non distended.  Extremities: Right lower (see below pic-taken today). Left leg at usual baseline                 Neuro:non focal  Intake/Output from previous day:  Intake/Output Summary (Last 24 hours) at 12/27/14 1316 Last data filed at 12/27/14 1300  Gross per 24 hour  Intake 2526.16 ml  Output   3250 ml  Net -723.84 ml     LAB RESULTS: CBC  Recent Labs Lab 12/21/14 0900  12/23/14 0530 12/24/14 0520 12/25/14 0525 12/26/14 0538 12/27/14 0615  WBC 14.3*  < > 8.0 8.4 8.1 8.7 10.6*  HGB 14.7  < > 12.5* 10.6* 10.9* 10.4* 12.1*  HCT 43.1  < > 38.0* 31.9* 32.5* 30.6* 35.7*  PLT 169  < > 122* 143* 164 195 257  MCV 84.2  < > 84.3 83.5 82.7 82.3 83.2  MCH 28.7  < > 27.7 27.7 27.7 28.0 28.2  MCHC 34.1  < > 32.9 33.2 33.5 34.0 33.9  RDW 13.7  < > 13.7 13.8 13.9 14.0 14.3  LYMPHSABS 0.6*  --   --  1.0  --   --   --   MONOABS 0.8  --   --  0.7  --   --   --   EOSABS 0.0  --   --  0.1  --   --   --   BASOSABS 0.0  --   --  0.0  --   --   --   < > = values in this interval not displayed.  Chemistries   Recent Labs Lab 12/23/14 0530 12/24/14 0520 12/25/14 0525 12/26/14 0538 12/27/14 0615  NA 138 139 140 141 142  K 3.2* 3.4* 3.7 4.1 4.0  CL 104 104 104 107 108  CO2 24 27 27 25 25   GLUCOSE 110* 115* 118* 169* 147*  BUN 8  7 10 12 17   CREATININE 1.37* 1.21 1.14 0.84 0.95  CALCIUM 8.1* 7.9* 8.0* 8.6* 8.7*    CBG: No results for input(s): GLUCAP in the last 168 hours.  GFR Estimated Creatinine Clearance: 124.7 mL/min (by C-G formula based on Cr of 0.95).  Coagulation profile  No results for input(s): INR, PROTIME in the last 168 hours.  Cardiac Enzymes No results for input(s): CKMB, TROPONINI, MYOGLOBIN in the last 168 hours.  Invalid input(s): CK  Invalid input(s): POCBNP No results for input(s): DDIMER in the last 72 hours. No results for input(s): HGBA1C in the last 72 hours. No results for input(s): CHOL, HDL, LDLCALC, TRIG, CHOLHDL, LDLDIRECT in the last 72 hours. No results for input(s): TSH, T4TOTAL, T3FREE, THYROIDAB in the last 72 hours.  Invalid input(s): FREET3 No results for input(s): VITAMINB12, FOLATE, FERRITIN, TIBC, IRON, RETICCTPCT in the last 72 hours. No results for input(s): LIPASE, AMYLASE in the last 72 hours.  Urine Studies No results for input(s): UHGB, CRYS in the last 72 hours.  Invalid input(s): UACOL, UAPR, USPG, UPH, UTP, UGL, UKET, UBIL, UNIT, UROB, ULEU, UEPI, UWBC, URBC, UBAC, CAST, UCOM, BILUA  MICROBIOLOGY: Recent Results (from the past 240 hour(s))  Culture, blood (Routine X 2) w Reflex to ID Panel     Status: None   Collection Time: 12/21/14  9:00 AM  Result Value Ref Range Status   Specimen Description BLOOD LEFT ANTECUBITAL  Final   Special Requests BOTTLES DRAWN AEROBIC AND ANAEROBIC 5MLS  Final   Culture NO GROWTH 5 DAYS  Final   Report Status 12/26/2014 FINAL  Final  Culture, blood (Routine X 2) w Reflex to ID Panel     Status: None   Collection Time: 12/21/14 10:24 AM  Result Value Ref Range Status   Specimen Description BLOOD RIGHT HAND  Final   Special Requests BOTTLES DRAWN AEROBIC AND ANAEROBIC 5CCS  Final   Culture NO GROWTH 5 DAYS  Final   Report Status 12/26/2014 FINAL  Final    RADIOLOGY STUDIES/RESULTS: Dg Chest 2 View  12/21/2014   CLINICAL DATA:  Cellulitis RIGHT lower leg, fever 103 degrees, cough EXAM: CHEST  2 VIEW COMPARISON:  None FINDINGS: Normal heart size, mediastinal contours, and pulmonary vascularity. Mild peribronchial thickening. No pulmonary infiltrate, pleural effusion, or pneumothorax. RIGHT glenohumeral degenerative changes. Bones otherwise unremarkable. IMPRESSION: Mild bronchitic changes without infiltrate. Electronically Signed   By: Lavonia Dana M.D.   On: 12/21/2014 12:31   Ct Foot Right Wo Contrast  12/24/2014  CLINICAL DATA:  Enhancing right foot cellulitis with pain and swelling. EXAM: CT OF THE RIGHT FOOT WITHOUT CONTRAST TECHNIQUE: Multidetector CT imaging of the right foot was performed according to the standard protocol. Multiplanar CT image reconstructions were also generated. COMPARISON:  Radiographs 01/05/2013.  No recent studies. FINDINGS: There is moderate subcutaneous edema throughout the foot, greatest dorsally. No focal fluid collections are identified on noncontrast imaging. There is apparent skin blistering along the medial aspect of the hindfoot, measuring up to 3.3 cm in diameter. As evaluated by CT, the ankle tendons appear intact without significant tenosynovitis. There is no significant ankle joint effusion. There are no significant arthropathic changes other than mild degenerative changes at the first MTP joint. No evidence of acute fracture, dislocation or osteomyelitis. IMPRESSION: 1. Generalized subcutaneous edema throughout the foot consistent with given clinical impression of cellulitis. No focal abscess identified on noncontrast imaging. 2. Skin blister involving the medial hindfoot. 3. No evidence of osteomyelitis. Electronically Signed   By: Richardean Sale M.D.   On: 12/24/2014 19:48    Oren Binet, MD  [facility] Pager:[phone]  If 7PM-7AM, please contact night-coverage [email or web address] Password [email or web address] 12/27/2014, 1:16 PM   LOS: 6 days

## 2014-12-27 NOTE — Consult Note (Addendum)
WOC wound consult note Reason for Consult:  Consult requested for RLE cellulitis.  RLE extremity edematous from foot to half-way up the calf with erythema and purple intact blisters and patches of red circular rash.  It involves entire right foot and toes, and extends up leg to middle calf. Blisters are beginning to rupture in patchy areas. Beginning to recede from previous line which was drawn on his leg. Biopsy results pending. Wound type: Full thickness wounds where previous blisters have ruptured.  Removed outer layers of loose skin, revealing yellow gelatinous covering over wounds: Right inner ankle open area 3X3cm, right great toe 1X1cm  Right anterior foot 3X7X.1cm, dark red moist wound bed without further yellow gelatinous covering. Drainage (amount, consistency, odor) Mod amt yellow drainage, no odor Dressing procedure/placement/frequency: Xeroform dressings daily to promote healing over ruptured blistered areas. Pt can shower without dressings when at home, using antibacterial soap, and then lightly towel dry over the affected areas to assist with removal of loose nonviable tissue. Discussed plan of care with patient and he verbalized understanding. Recommend home health for dressing change assistance after discharge; please order if desired.  Please re-consult if further assistance is needed.  Thank-you,  Julien Girt MSN, [place], [place], [place], [place]

## 2014-12-27 NOTE — Care Management Note (Addendum)
Case Management Note  Patient Details  Name: Benjamin Mcdowell MRN: [id number] Date of Birth: July 22, 1965  Subjective/Objective:    Patient states he will like to have [name] to show wife how to do dressing changes.   He will also be on zyvox, NCM checking benefits.  Zyvox for generic will be $5 for 28 pills per [facility], they will order it and it should be in by 5pm tomorrow.               Action/Plan:   Expected Discharge Date:                  Expected Discharge Plan:  [name]  In-House Referral:     Discharge planning Services  CM Consult  Post Acute Care Choice:  Home Health Choice offered to:  Patient  DME Arranged:    DME Agency:     HH Arranged:  RN [place] Agency:  [facility]  Status of Service:  Completed, signed off  Medicare Important Message Given:    Date Medicare IM Given:    Medicare IM give by:    Date Additional Medicare IM Given:    Additional Medicare Important Message give by:     If discussed at [place] of Stay Meetings, dates discussed:    Additional Comments:  Zenon Mayo, RN 12/27/2014, 12:50 PM

## 2014-12-28 DIAGNOSIS — L52 Erythema nodosum: Secondary | ICD-10-CM

## 2014-12-28 DIAGNOSIS — N178 Other acute kidney failure: Secondary | ICD-10-CM

## 2014-12-28 LAB — CREATININE, SERUM
Creatinine, Ser: 1 mg/dL (ref 0.61–1.24)
GFR calc Af Amer: >60 mL/min (ref >60)
GFR calc non Af Amer: >60 mL/min (ref >60)

## 2014-12-28 MED ORDER — POLYETHYLENE GLYCOL 3350 17 G PO PACK: 17.0000 g | PACK | Freq: Every day | ORAL | Status: DC

## 2014-12-28 MED ORDER — OXYCODONE HCL 5 MG PO TABS: 5.0000 mg | ORAL_TABLET | Freq: Four times a day (QID) | ORAL | Status: DC | PRN

## 2014-12-28 MED ORDER — PREDNISONE 10 MG PO TABS: ORAL_TABLET | ORAL | Status: DC

## 2014-12-28 NOTE — Care Management Note (Signed)
Case Management Note  Patient Details  Name: [name] MRN: [id number] Date of Birth: 1965/08/24  Subjective/Objective:   Patient is for dc today, [place] notified.  Also [facility] located inside of [employer] on [address] [name] zyvox for patient and will have it today at pm, which will be generic for $5.                 Action/Plan:   Expected Discharge Date:                  Expected Discharge Plan:  [place]  In-House Referral:     Discharge planning Services  CM Consult  Post Acute Care Choice:  Home Health Choice offered to:  Patient  DME Arranged:    DME Agency:     HH Arranged:  RN [place] Agency:  [place]  Status of Service:  Completed, signed off  Medicare Important Message Given:    Date Medicare IM Given:    Medicare IM give by:    Date Additional Medicare IM Given:    Additional Medicare Important Message give by:     If discussed at [place] of Stay Meetings, dates discussed:    Additional Comments:  Zenon Mayo, RN 12/28/2014, 10:54 AM

## 2014-12-28 NOTE — Progress Notes (Addendum)
Nsg Discharge Note  Admit Date:  12/21/2014 Discharge date: 12/28/2014   Lonni Fix to be D/C'd Home with home health,  per MD order.  AVS completed.  Copy for chart, and copy for patient signed, and dated. Right foot treatment done, wrapped in Kerlix/Abd pads. Boot applied to Right lower extremity and crutches sent home with patient.  Patient/caregiver able to verbalize understanding.  Discharge Medication:   Medication List    STOP taking these medications        ibuprofen 200 MG tablet  Commonly known as:  ADVIL,MOTRIN     protein supplement shake Liqd  Commonly known as:  PREMIER PROTEIN      TAKE these medications        linezolid 600 MG tablet  Commonly known as:  ZYVOX  Take 1 tablet (600 mg total) by mouth every 12 (twelve) hours.     oxyCODONE 5 MG immediate release tablet  Commonly known as:  Oxy IR/ROXICODONE  Take 1 tablet (5 mg total) by mouth every 6 (six) hours as needed for moderate pain.     polyethylene glycol packet  Commonly known as:  MIRALAX / GLYCOLAX  Take 17 g by mouth daily.     predniSONE 10 MG tablet  Commonly known as:  DELTASONE  Take 6 tablets (60 mg) daily for 2 days, then, Take 5 tablets (50 mg) daily for 2 days, then, Take 4 tablets (40 mg) daily for 2 days, then, Take 3 tablets (30 mg) daily for 2 days, then, Take 2 tablets (20 mg) daily for 2 days, then, Take 1 tablets (10 mg) daily for 1 days, then stop        Discharge Assessment: Filed Vitals:   12/27/14 2235 12/28/14 0457  BP: 153/82 125/61  Pulse: 45 53  Temp: 98.2 F (36.8 C) 97.6 F (36.4 C)  Resp: 18 16   Skin clean, dry and intact without evidence of skin break down, no evidence of skin tears noted. IV catheter discontinued intact. Site without signs and symptoms of complications - no redness or edema noted at insertion site, patient denies c/o pain - only slight tenderness at site.  Dressing with slight pressure applied.  D/c Instructions-Education: Discharge  instructions given to patient/family with verbalized understanding. D/c education completed with patient/family including follow up instructions, medication list, d/c activities limitations if indicated, with other d/c instructions as indicated by MD - patient able to verbalize understanding, all questions fully answered. Patient instructed to return to ED, call 911, or call MD for any changes in condition.  Patient escorted via [place], and D/C home via private auto.  Dayle Points, RN 12/28/2014 2:52 PM

## 2014-12-28 NOTE — Discharge Instructions (Signed)
Follow with Primary MD  Lucretia Kern., DO  and ID clinic  Please get a complete blood count and chemistry panel checked by your Primary MD at your next visit, and again as instructed by your Primary MD.  Please ask your Primary to work up Erythema Nodosum that was seen on your biopsy report  Dressing procedure/placement/frequency: Xeroform dressings daily to promote healing over ruptured blistered areas. Pt can shower without dressings when at home, using antibacterial soap, and then lightly towel dry over the affected areas to assist with removal of loose nonviable tissue. Discussed plan of care with patient and he verbalized understanding.  Get Medicines reviewed and adjusted. Please take all your medications with you for your next visit with your Primary MD  Please request your Primary MD to go over all hospital tests and procedure/radiological results at the follow up, please ask your Primary MD to get all Hospital records sent to his/her office.  If you experience worsening of your admission symptoms, develop shortness of breath, life threatening emergency, suicidal or homicidal thoughts you must seek medical attention immediately by calling 911 or calling your MD immediately  if symptoms less severe.  You must read complete instructions/literature along with all the possible adverse reactions/side effects for all the Medicines you take and that have been prescribed to you. Take any new Medicines after you have completely understood and accpet all the possible adverse reactions/side effects.   Do not drive when taking Pain medications or sleeping medications (Benzodaizepines)  Do not take more than prescribed Pain, Sleep and Anxiety Medications  Special Instructions: If you have smoked or chewed Tobacco  in the last 2 yrs please stop smoking, stop any regular Alcohol  and or any Recreational drug use.  Wear Seat belts while driving.  Please note  You were cared for by a hospitalist  during your hospital stay. Once you are discharged, your primary care physician will handle any further medical issues. Please note that NO REFILLS for any discharge medications will be authorized once you are discharged, as it is imperative that you return to your primary care physician (or establish a relationship with a primary care physician if you do not have one) for your aftercare needs so that they can reassess your need for medications and monitor your lab values.    Erythema Nodosum Erythema nodosum is a skin condition in which patches of fat under the skin of the lower legs become inflamed. This causes painful bumps (nodules) to form. CAUSES Common causes of this condition include:  Infections.  Certain medicines, especially birth control pills, penicillin, and sulfa medicines. Other causes include:  Pregnancy.  Certain inflammatory conditions, including Lupus, Crohn's disease, and thyroid conditions. In some cases, the cause may not be known. RISK FACTORS This condition is more likely to develop in young adult women. SYMPTOMS The main symptom of this condition is large nodules that look like raised bruises and are tender to the touch. These nodules usually appear on the shins, but they may also appear on the arms or the trunk. They gradually change in color from pink to brown, and they leave a dark mark that clears up in several months. Other symptoms include:  Fever.  Fatigue.  Joint pain.  Itchiness. DIAGNOSIS This condition is diagnosed based on symptoms. To find the underlying condition that caused the erythema nodosum, your health care provider may also do a physical exam, X-rays, and blood tests. TREATMENT Treatment for this condition depends on the cause.  The nodules usually go away with treatment of the underlying condition. Any pain or discomfort may be treated with:  Anti-inflammatory medicines.  Bed rest.  Raising (elevating) the affected area.  Cool  compresses. In some cases, steroids and potassium iodide tablets may be given. HOME CARE INSTRUCTIONS  Take medicines only as directed by your health care provider.  Stay in bed for as long as directed by your health care provider.  Until your symptoms go away, limit any exercising that makes you breathe harder and faster (vigorous).  Elevate the affected leg as directed by your health care provider.  Apply cool compresses to the affected area as directed by your health care provider. SEEK MEDICAL CARE IF:  Your symptoms are not improving.  You have a fever that does not go away. SEEK IMMEDIATE MEDICAL CARE IF:  Your condition gets worse.  Your pain gets worse.  You have a sore throat.  You vomit repeatedly.   This information is not intended to replace advice given to you by your health care provider. Make sure you discuss any questions you have with your health care provider.   Document Released: 01/31/2004 Document Revised: 05/09/2014 Document Reviewed: 11/30/2013 Elsevier Interactive Patient Education [employer].

## 2014-12-28 NOTE — Progress Notes (Signed)
Pharmacist Provided - Patient Medication Education Prior to Discharge   Benjamin Mcdowell is an 49 y.o. male who presented to [facility] on 12/21/2014 with a chief complaint of  Chief Complaint  Patient presents with  . Cellulitis     [x]  Patient will be discharged with 1 new medication []  Patient being discharged without any new medications  The following medications were discussed with the patient: Zyvox   Pain Control medications: []  Yes    [x]  No  Diabetes Medications: []  Yes    [x]  No  Heart Failure Medications: []  Yes    [x]  No  Anticoagulation Medications:  []  Yes    [x]  No  Antibiotics at discharge: []  Yes    [x]  No  Allergy Assessment Completed and Updated: [x]  Yes    []  No Identified Patient Allergies: No Known Allergies   Medication Adherence Assessment: [x]  Excellent (no doses missed/week)      []  Good (1 dose missed/week)      []  Partial (2-3 doses missed/week)      []  Poor (>3 doses missed/week)  Barriers to Obtaining Medications: []  Yes [x]  No   Assessment: I spoke with Mr. Galyen about the antibiotic regimen that he will be prescribed upon discharge home (Zyvox 600mg  PO BID for 2 weeks) for cellulitis of his right leg. I counseled him on how to take this medication and how to look for signs or symptoms of an allergic reaction. He repeated the medication directions and counseling points back to me. He has a good understanding of his regimen and had no further medication questions.   Time spent preparing for discharge counseling: 15 minutes  Time spent counseling patient: 7 minutes   Alvan Culpepper C. Lennox Grumbles, PharmD Pharmacy Resident  Pager: [phone] 12/28/2014 1:58 PM

## 2014-12-28 NOTE — Progress Notes (Signed)
Orthopedic Tech Progress Note Patient Details:  Brookes Pick 1965-06-03 [id number]  Ortho Devices Type of Ortho Device: Crutches Ortho Device/Splint Interventions: Application   [place] Pink 12/28/2014, 1:52 PM

## 2014-12-28 NOTE — Progress Notes (Signed)
Orthopedic Tech Progress Note Patient Details:  Benjamin Mcdowell 04-26-1965 [id number]  Ortho Devices Type of Ortho Device: CAM walker Ortho Device/Splint Interventions: Application   [place] Pink 12/28/2014, 12:17 PM

## 2014-12-28 NOTE — Discharge Summary (Signed)
PATIENT DETAILS Name: Benjamin Mcdowell Age: 49 y.o. Sex: male Date of Birth: 04-03-1965 MRN: [id number]. Admitting Physician: Elmarie Shiley, MD PCP:[name], Nickola Major., DO  Admit Date: 12/21/2014 Discharge date: 12/28/2014  Recommendations for Outpatient Follow-up:  1. Ensure completion of tapering steroids, and 2 weeks of Zyvox-reassess right lower extremity (see pictures below taken on day of discharge-daily progress note has more pictures taken on that particular day).  2. Please consider referral to dermatology  3. Erythema nodosum also seen on biopsy-would need workup ruled out sarcoidosis, lymphoma and other causes of erythema nodosum-defer to the outpatient setting  4. Consider repeating CBC and chemistry panel at follow-up with PCP    PRIMARY DISCHARGE DIAGNOSIS:  Principal Problem:   Cellulitis of right lower extremity Active Problems:   CHRONIC Edema of right lower extremity   Acute renal failure (ARF) (HCC)   SIRS with acute organ dysfunction due to infectious process (HCC)   Acute hyperglycemia   Obesity, Class II, BMI 35-39.9, isolated (HCC)   Cellulitis of right leg   Purpura (HCC)   Vasculitis of skin   Sepsis (HCC)   Hyperglycemia   Erythema nodosum   Allergic drug rash      PAST MEDICAL HISTORY: Past Medical History  Diagnosis Date  . Anal fissure     "lanced it w/my hemorrhoids"  . Cellulitis     recurrent cellulitis right foot-per patient-per patient in hospital for IV antibiotics 01/2012  . Edema     LE, R>L since child hood  . Hemorrhoids   . Cancer of skin of back     "don't know which kind"  . Cellulitis of right lower extremity hospitalized 12/21/2014    DISCHARGE MEDICATIONS: Current Discharge Medication List    START taking these medications   Details  linezolid (ZYVOX) 600 MG tablet Take 1 tablet (600 mg total) by mouth every 12 (twelve) hours. Qty: 28 tablet, Refills: 0    oxyCODONE (OXY IR/ROXICODONE) 5 MG immediate release tablet  Take 1 tablet (5 mg total) by mouth every 6 (six) hours as needed for moderate pain. Qty: 30 tablet, Refills: 0    polyethylene glycol (MIRALAX / GLYCOLAX) packet Take 17 g by mouth daily. Qty: 14 each, Refills: 0    predniSONE (DELTASONE) 10 MG tablet Take 6 tablets (60 mg) daily for 2 days, then, Take 5 tablets (50 mg) daily for 2 days, then, Take 4 tablets (40 mg) daily for 2 days, then, Take 3 tablets (30 mg) daily for 2 days, then, Take 2 tablets (20 mg) daily for 2 days, then, Take 1 tablets (10 mg) daily for 1 days, then stop Qty: 41 tablet, Refills: 0      STOP taking these medications     ibuprofen (ADVIL,MOTRIN) 200 MG tablet      protein supplement shake (PREMIER PROTEIN) LIQD         ALLERGIES:  No Known Allergies  BRIEF HPI:  See H&P, Labs, Consult and Test reports for all details in brief, patient was admitted for fever and right lower extremity erythema.   CONSULTATIONS:   ID  PERTINENT RADIOLOGIC STUDIES: Dg Chest 2 View  12/21/2014  CLINICAL DATA:  Cellulitis RIGHT lower leg, fever 103 degrees, cough EXAM: CHEST  2 VIEW COMPARISON:  None FINDINGS: Normal heart size, mediastinal contours, and pulmonary vascularity. Mild peribronchial thickening. No pulmonary infiltrate, pleural effusion, or pneumothorax. RIGHT glenohumeral degenerative changes. Bones otherwise unremarkable. IMPRESSION: Mild bronchitic changes without infiltrate. Electronically Signed   By:  Lavonia Dana M.D.   On: 12/21/2014 12:31   Ct Foot Right Wo Contrast  12/24/2014  CLINICAL DATA:  Enhancing right foot cellulitis with pain and swelling. EXAM: CT OF THE RIGHT FOOT WITHOUT CONTRAST TECHNIQUE: Multidetector CT imaging of the right foot was performed according to the standard protocol. Multiplanar CT image reconstructions were also generated. COMPARISON:  Radiographs 01/05/2013.  No recent studies. FINDINGS: There is moderate subcutaneous edema throughout the foot, greatest dorsally. No focal  fluid collections are identified on noncontrast imaging. There is apparent skin blistering along the medial aspect of the hindfoot, measuring up to 3.3 cm in diameter. As evaluated by CT, the ankle tendons appear intact without significant tenosynovitis. There is no significant ankle joint effusion. There are no significant arthropathic changes other than mild degenerative changes at the first MTP joint. No evidence of acute fracture, dislocation or osteomyelitis. IMPRESSION: 1. Generalized subcutaneous edema throughout the foot consistent with given clinical impression of cellulitis. No focal abscess identified on noncontrast imaging. 2. Skin blister involving the medial hindfoot. 3. No evidence of osteomyelitis. Electronically Signed   By: Richardean Sale M.D.   On: 12/24/2014 19:48     PERTINENT LAB RESULTS: CBC:  Recent Labs  12/26/14 0538 12/27/14 0615  WBC 8.7 10.6*  HGB 10.4* 12.1*  HCT 30.6* 35.7*  PLT 195 257   CMET CMP     Component Value Date/Time   NA 142 12/27/2014 0615   K 4.0 12/27/2014 0615   CL 108 12/27/2014 0615   CO2 25 12/27/2014 0615   GLUCOSE 147* 12/27/2014 0615   BUN 17 12/27/2014 0615   CREATININE 1.00 12/28/2014 0525   CALCIUM 8.7* 12/27/2014 0615   PROT 5.3* 12/25/2014 0525   ALBUMIN 2.2* 12/25/2014 0525   AST 23 12/25/2014 0525   ALT 39 12/25/2014 0525   ALKPHOS 60 12/25/2014 0525   BILITOT 0.7 12/25/2014 0525   GFRNONAA >60 12/28/2014 0525   GFRAA >60 12/28/2014 0525    GFR Estimated Creatinine Clearance: 118.4 mL/min (by C-G formula based on Cr of 1). No results for input(s): LIPASE, AMYLASE in the last 72 hours. No results for input(s): CKTOTAL, CKMB, CKMBINDEX, TROPONINI in the last 72 hours. Invalid input(s): POCBNP No results for input(s): DDIMER in the last 72 hours. No results for input(s): HGBA1C in the last 72 hours. No results for input(s): CHOL, HDL, LDLCALC, TRIG, CHOLHDL, LDLDIRECT in the last 72 hours. No results for input(s):  TSH, T4TOTAL, T3FREE, THYROIDAB in the last 72 hours.  Invalid input(s): FREET3 No results for input(s): VITAMINB12, FOLATE, FERRITIN, TIBC, IRON, RETICCTPCT in the last 72 hours. Coags: No results for input(s): INR in the last 72 hours.  Invalid input(s): PT Microbiology: Recent Results (from the past 240 hour(s))  Culture, blood (Routine X 2) w Reflex to ID Panel     Status: None   Collection Time: 12/21/14  9:00 AM  Result Value Ref Range Status   Specimen Description BLOOD LEFT ANTECUBITAL  Final   Special Requests BOTTLES DRAWN AEROBIC AND ANAEROBIC 5MLS  Final   Culture NO GROWTH 5 DAYS  Final   Report Status 12/26/2014 FINAL  Final  Culture, blood (Routine X 2) w Reflex to ID Panel     Status: None   Collection Time: 12/21/14 10:24 AM  Result Value Ref Range Status   Specimen Description BLOOD RIGHT HAND  Final   Special Requests BOTTLES DRAWN AEROBIC AND ANAEROBIC 5CCS  Final   Culture NO GROWTH 5 DAYS  Final   Report Status 12/26/2014 FINAL  Final     BRIEF HOSPITAL COURSE:  Sepsis: Sepsis pathophysiology resolved with empiric antibiotics and steroids. Initially started on broad-spectrum antibiotics with vancomycin and Zosyn-antibiotics were then changed to Teflaro and clindamycin, upon clinical improvement antibiotics now narrowed just to Zyvox. Blood cultures negative.   Active Problems: Cellulitis of right lower extremity versus vasculitis: Initially presented with right leg erythema and fever, hospital course was marked by rapid development of a purpuric rash with blistering the next day. He had persistent fevers for the first few days, antibiotics were changed from Vanco/Zosyn  to Teflaro and clindamycin. CT leg was negative for any deep infection, lower extremity Dopplers was negative for DVT. General surgery was consulted, patient underwent a punch biopsy that showed 2 different processes -superficial layers consistent with a hypersensitivity reaction, deeper layers  suggestive of erythema nodosum. poke with infectious disease-Dr. Tommy Medal- plans are to complete 2 weeks of antibiotic on discharge and taper steroids over the course of 10 days. It is unknown at this time what provoked this is an allergic process that was seen on the biopsy-patient did acknowledge using cocaine a few weeks prior to this admission. I have asked patient to make an appointment with the dermatologist of his choice, patient is aware that he will require further workup of erythema nodosum-and that can be done in the outpatient setting-as this was an incidental finding. Over the past few days, his right leg lesions have slowly improved, he no longer has any erythema, some of the blisters have started rupture and the purpuric rash has started to lighten up. Home health RN has been arranged for wound care and dressing.   Erythema nodosum: Seen on biopsy on the deeper layers-further workup for underlying causes of erythema nodosum-like sarcoidosis/lymphoma etc. deferred to the outpatient setting. Patient is aware of this diagnosis and need for further workup.   Polysubstance abuse: Counseled-he has been asked to avoid cocaine in the future-especially since unknown what caused the allergic reaction.  ARF: Prerenal azotemia secondary to sepsis. Resolved with IVF.  Obesity, Class II, BMI 35-39.9: Counseled regarding importance of weight loss  Mild hyperglycemia-A1c 5.6. Monitor for now   TODAY-DAY OF DISCHARGE:  Subjective:   Lonni Fix today has no headache,no chest abdominal pain,no new weakness tingling or numbness, feels much better wants to go home today.   Objective:   Blood pressure 125/61, pulse 53, temperature 97.6 F (36.4 C), temperature source Oral, resp. rate 16, height 6' (1.829 m), weight 117.935 kg (260 lb), SpO2 97 %.  Intake/Output Summary (Last 24 hours) at 12/28/14 1139 Last data filed at 12/28/14 1036  Gross per 24 hour  Intake    240 ml  Output   2700 ml  Net   -2460 ml   Filed Weights   12/21/14 0819 12/21/14 1704  Weight: 117.935 kg (260 lb) 117.935 kg (260 lb)    Exam Awake Alert, Oriented *3, No new F.N deficits, Normal affect [place].AT,PERRAL Supple Neck,No JVD, No cervical lymphadenopathy appriciated.  Symmetrical Chest wall movement, Good air movement bilaterally, CTAB RRR,No Gallops,Rubs or new Murmurs, No Parasternal Heave +ve B.Sounds, Abd Soft, Non tender, No organomegaly appriciated, No rebound -guarding or rigidity. No Cyanosis, Clubbing or edema, No new Rash or bruise        DISCHARGE CONDITION: Stable  DISPOSITION: Home with home health services  DISCHARGE INSTRUCTIONS:    Activity:  As tolerated with Full fall precautions use walker/cane & assistance as needed  Get Medicines reviewed and adjusted: Please take all your medications with you for your next visit with your Primary MD  Please request your Primary MD to go over all hospital tests and procedure/radiological results at the follow up, please ask your Primary MD to get all Hospital records sent to his/her office.  If you experience worsening of your admission symptoms, develop shortness of breath, life threatening emergency, suicidal or homicidal thoughts you must seek medical attention immediately by calling 911 or calling your MD immediately  if symptoms less severe.  You must read complete instructions/literature along with all the possible adverse reactions/side effects for all the Medicines you take and that have been prescribed to you. Take any new Medicines after you have completely understood and accpet all the possible adverse reactions/side effects.   Do not drive when taking Pain medications.   Do not take more than prescribed Pain, Sleep and Anxiety Medications  Special Instructions: If you have smoked or chewed Tobacco  in the last 2 yrs please stop smoking, stop any regular Alcohol  and or any Recreational drug use.  Wear Seat belts while  driving.  Please note  You were cared for by a hospitalist during your hospital stay. Once you are discharged, your primary care physician will handle any further medical issues. Please note that NO REFILLS for any discharge medications will be authorized once you are discharged, as it is imperative that you return to your primary care physician (or establish a relationship with a primary care physician if you do not have one) for your aftercare needs so that they can reassess your need for medications and monitor your lab values.   Diet recommendation: Heart Healthy diet  Discharge Instructions    Call MD for:  redness, tenderness, or signs of infection (pain, swelling, redness, odor or green/yellow discharge around incision site)    Complete by:  As directed      Call MD for:  temperature >100.4    Complete by:  As directed      Diet general    Complete by:  As directed      Discharge wound care:    Complete by:  As directed   Xeroform dressings daily to promote healing over ruptured blistered areas. Pt can shower without dressings when at home, using antibacterial soap, and then lightly towel dry over the affected areas to assist with removal of loose nonviable tissue. Discussed plan of care with patient and he verbalized understanding.     Increase activity slowly    Complete by:  As directed            Follow-up Information    Follow up with [facility].   Why:  HHRN for wound care   Contact information:   [address] [place] [address] [phone]       Follow up with Colin Benton R., DO. Schedule an appointment as soon as possible for a visit on 01/05/2015.   Specialty:  Family Medicine   Why:  Hospital follow up, For wound re-check// Appointment with Dr. Maudie Mercury is on 01/05/15 at 9:15am   Contact information:   [place] [address] [phone]       Follow up with Alcide Evener, MD. Schedule an appointment as  soon as possible for a visit in 2 weeks.   Specialty:  Infectious Diseases   Why:  For wound re-check, Hospital follow up   Contact information:   [address] [place] [address] [phone]      Total Time spent on discharge equals 45 minutes.   SignedOren Binet 12/28/2014 11:39 AM

## 2015-01-02 LAB — URINE DRUGS OF ABUSE SCREEN W ALC, ROUTINE (REF LAB)
BENZODIAZEPINE QUANT UR: NEGATIVE ng/mL
Barbiturate, Ur: NEGATIVE ng/mL
CANNABINOID QUANT UR: NEGATIVE ng/mL
Cocaine (Metab.): NEGATIVE ng/mL
Ethanol U, Quan: NEGATIVE %
METHADONE SCREEN, URINE: NEGATIVE ng/mL
PHENCYCLIDINE, UR: NEGATIVE ng/mL
Propoxyphene, Urine: NEGATIVE ng/mL

## 2015-01-02 LAB — OPIATES CONFIRMATION, URINE: OPIATES: NEGATIVE

## 2015-01-02 LAB — AMPHETAMINE CONF, UR: Amphetamines: NEGATIVE

## 2015-01-05 ENCOUNTER — Ambulatory Visit (INDEPENDENT_AMBULATORY_CARE_PROVIDER_SITE_OTHER): Payer: Managed Care, Other (non HMO) | Admitting: Family Medicine

## 2015-01-05 ENCOUNTER — Encounter: Payer: Self-pay | Admitting: Family Medicine

## 2015-01-05 VITALS — BP 124/80 | HR 77 | Temp 97.9°F | Ht 72.0 in | Wt 245.8 lb

## 2015-01-05 DIAGNOSIS — R600 Localized edema: Secondary | ICD-10-CM

## 2015-01-05 DIAGNOSIS — A419 Sepsis, unspecified organism: Secondary | ICD-10-CM

## 2015-01-05 DIAGNOSIS — L03115 Cellulitis of right lower limb: Secondary | ICD-10-CM

## 2015-01-05 DIAGNOSIS — N178 Other acute kidney failure: Secondary | ICD-10-CM

## 2015-01-05 DIAGNOSIS — R6520 Severe sepsis without septic shock: Secondary | ICD-10-CM

## 2015-01-05 NOTE — Patient Instructions (Signed)
Follow up with dermatology and infectious disease as planned.  Follow up here for you physical as scheduled.

## 2015-01-05 NOTE — Progress Notes (Signed)
HPI:  Benjamin Mcdowell is a 49 yo M whom I have seen once before here for hospital follow up. He has a PMH sig for R LE edema and recurrent cellulitis since he was 49 yo per his report. He was hospitalized 12/15-12/22/16 for cellulitis of the RLE and sepsis. Per discharge notes he was on vanc/zosyn, then teflaro and clinda, then zyvox. Neg blood cxs. Interestingly the rash apparently worsened and a biopsy showed a hypersensitivity reaction in the superficial layers and findings suggesting erythema nodosum in the deeper layers. He is on a 10 day steroid taper and abx for 2 weeks. He reports he has an appointment with Dr. Elvera Lennox at [facility] dermatology today for follow up. He also is to follow up up with ID whom followed him in the hospital.Per notes he also uses cocaine. Had mild ARF in the hospital that resolved. He reports: he is doing great with improvement in appearance of skin, pain and swelling. Has homehealth doing dressing changes. Denies: fevers, malaise, sig pain, spreading of rash. He reports he uses cocaine less then a few times per year and report will never use again. Denies any other drug use.  ROS: See pertinent positives and negatives per HPI.  Past Medical History  Diagnosis Date  . Anal fissure     "lanced it w/my hemorrhoids"  . Cellulitis     recurrent cellulitis right foot-per patient-per patient in hospital for IV antibiotics 01/2012  . Edema     LE, R>L since child hood  . Hemorrhoids   . Cancer of skin of back     "don't know which kind"  . Cellulitis of right lower extremity hospitalized 12/21/2014    Past Surgical History  Procedure Laterality Date  . Penis revascularization surgery    . Umbilical hernia repair  12/24/2007    with Proceed ventral  . Hernia repair    . Hemorrhoid surgery  1985    "had them lanced; also lanced anal fissure"    Family History  Problem Relation Age of Onset  . Prostate cancer Father   . Heart disease      GRANDFATHER     Social History   Social History  . Marital Status: Married    Spouse Name: N/A  . Number of Children: N/A  . Years of Education: N/A   Social History Main Topics  . Smoking status: Never Smoker   . Smokeless tobacco: Never Used  . Alcohol Use: 3.6 oz/week    6 Cans of beer, 0 Standard drinks or equivalent per week  . Drug Use: Yes    Special: Marijuana     Comment: 12/21/2014 "couple times q 3-4 months"  . Sexual Activity: Yes   Other Topics Concern  . None   Social History Narrative   Work or School: Insurance account manager Situation: lives with wife and 21 yo twins in 2016      Spiritual Beliefs: non practicing Christian      Lifestyle: started exercise and diet changes in summer 2016           Current outpatient prescriptions:  .  linezolid (ZYVOX) 600 MG tablet, Take 1 tablet (600 mg total) by mouth every 12 (twelve) hours., Disp: 28 tablet, Rfl: 0 .  oxyCODONE (OXY IR/ROXICODONE) 5 MG immediate release tablet, Take 1 tablet (5 mg total) by mouth every 6 (six) hours as needed for moderate pain., Disp: 30 tablet, Rfl: 0 .  predniSONE (DELTASONE) 10 MG  tablet, Take 6 tablets (60 mg) daily for 2 days, then, Take 5 tablets (50 mg) daily for 2 days, then, Take 4 tablets (40 mg) daily for 2 days, then, Take 3 tablets (30 mg) daily for 2 days, then, Take 2 tablets (20 mg) daily for 2 days, then, Take 1 tablets (10 mg) daily for 1 days, then stop, Disp: 41 tablet, Rfl: 0  EXAM:  Filed Vitals:   01/05/15 0928  BP: 124/80  Pulse: 77  Temp: 97.9 F (36.6 C)    Body mass index is 33.33 kg/(m^2).  GENERAL: vitals reviewed and listed above, alert, oriented, appears well hydrated and in no acute distress  HEENT: atraumatic, conjunttiva clear, no obvious abnormalities on inspection of external nose and ears  NECK: no obvious masses on inspection  LUNGS: clear to auscultation bilaterally, no wheezes, rales or rhonchi, good air movement  CV: HRRR, no peripheral  edema  SKIN: improving appearance of skin on R LE in camaprison to photos from discharge - decrease edema, decreased erythema, some flaking of skin and breakddown of skin on great toe and lateral feet, no purulent dischareg or discharge or blood on dressings  MS: moves all extremities without noticeable abnormality  PSYCH: pleasant and cooperative, no obvious depression or anxiety  ASSESSMENT AND PLAN:  Discussed the following assessment and plan:  Cellulitis of right lower extremity  CHRONIC Edema of right lower extremity  Acute renal failure with other specified pathological lesion in kidney (HCC)  SIRS with acute organ dysfunction due to infectious process (HCC)  Seems to be doing wuite well - given photos from discharge sig improvement in LE skin changes Follow up with ID and derm as planned  Patient advised to return or notify a doctor immediately if symptoms worsen or persist or new concerns arise.  Patient Instructions  Follow up with dermatology and infectious disease as planned.  Follow up here for you physical as scheduled.     Colin Benton R.

## 2015-01-05 NOTE — Progress Notes (Signed)
Pre visit review using our clinic review tool, if applicable. No additional management support is needed unless otherwise documented below in the visit note. 

## 2015-01-09 ENCOUNTER — Telehealth: Payer: Self-pay | Admitting: Family Medicine

## 2015-01-09 NOTE — Telephone Encounter (Signed)
Returned call to patient to advised patient our office had not received fax.  Pt states he just hung up with the [employer] company and they did not fax form over until today.  I advised pt that we would have to sort the faxes and get them to the provider.  Patient was also advised of 3-5 business day turnaround of form completion.  Pt states he understood but would really appreciate getting the form completed asap due to his bills piling up.  Check the fax machine and provider folder at 5:05pm and did not locate the [employer] form for this patient.

## 2015-01-09 NOTE — Telephone Encounter (Signed)
Pt was seen on 12-30 and would like joann to return his call concerning short term disability form.

## 2015-01-09 NOTE — Telephone Encounter (Signed)
Patient came in requesting to speak to the site manager because he had short term disabilty forms faxed here around December 28th. He needs them expedited. He has bills piling up and needs to get the forms completed. He said that he was going to keep calling and coming up here to check on these papers. He has already called checking before I was able to notate.

## 2015-01-09 NOTE — Telephone Encounter (Signed)
Suandrea-I have not seen forms as of today and Dr Maudie Mercury stated she has not seen these either.

## 2015-01-09 NOTE — Telephone Encounter (Signed)
Benjamin Mcdowell, what is the status of the this patient's short term disability form?

## 2015-01-11 DIAGNOSIS — R600 Localized edema: Secondary | ICD-10-CM | POA: Diagnosis not present

## 2015-01-11 DIAGNOSIS — N179 Acute kidney failure, unspecified: Secondary | ICD-10-CM | POA: Diagnosis not present

## 2015-01-11 DIAGNOSIS — R6511 Systemic inflammatory response syndrome (SIRS) of non-infectious origin with acute organ dysfunction: Secondary | ICD-10-CM | POA: Diagnosis not present

## 2015-01-11 DIAGNOSIS — L03115 Cellulitis of right lower limb: Secondary | ICD-10-CM | POA: Diagnosis not present

## 2015-01-12 ENCOUNTER — Telehealth: Payer: Self-pay | Admitting: Family Medicine

## 2015-01-12 NOTE — Telephone Encounter (Signed)
I called Kaitlyn at [phone] and informed her of the message below.

## 2015-01-12 NOTE — Telephone Encounter (Signed)
Katlin form [facility] to say pt cancel his visit with her today. She said she does require a call back

## 2015-01-12 NOTE — Telephone Encounter (Signed)
Clio he follow up with treating specialists if she or pt have any concerns.

## 2015-01-16 ENCOUNTER — Ambulatory Visit (INDEPENDENT_AMBULATORY_CARE_PROVIDER_SITE_OTHER): Payer: 59 | Admitting: Family Medicine

## 2015-01-16 ENCOUNTER — Encounter: Payer: Self-pay | Admitting: Family Medicine

## 2015-01-16 ENCOUNTER — Encounter: Payer: Self-pay | Admitting: *Deleted

## 2015-01-16 VITALS — BP 122/90 | HR 100 | Temp 98.5°F | Ht 72.0 in | Wt 256.3 lb

## 2015-01-16 DIAGNOSIS — R600 Localized edema: Secondary | ICD-10-CM | POA: Diagnosis not present

## 2015-01-16 DIAGNOSIS — L03115 Cellulitis of right lower limb: Secondary | ICD-10-CM

## 2015-01-16 DIAGNOSIS — N179 Acute kidney failure, unspecified: Secondary | ICD-10-CM

## 2015-01-16 LAB — CBC WITH DIFFERENTIAL/PLATELET
BASOS ABS: 0 10*3/uL (ref 0.0–0.1)
Basophils Relative: 0.3 % (ref 0.0–3.0)
Eosinophils Absolute: 0.4 10*3/uL (ref 0.0–0.7)
Eosinophils Relative: 4.7 % (ref 0.0–5.0)
HEMATOCRIT: 44.7 % (ref 39.0–52.0)
HEMOGLOBIN: 14.8 g/dL (ref 13.0–17.0)
LYMPHS PCT: 26.3 % (ref 12.0–46.0)
Lymphs Abs: 2.1 10*3/uL (ref 0.7–4.0)
MCHC: 33.1 g/dL (ref 30.0–36.0)
MCV: 84.3 fl (ref 78.0–100.0)
MONOS PCT: 8.5 % (ref 3.0–12.0)
Monocytes Absolute: 0.7 10*3/uL (ref 0.1–1.0)
NEUTROS PCT: 60.2 % (ref 43.0–77.0)
Neutro Abs: 4.9 10*3/uL (ref 1.4–7.7)
Platelets: 198 10*3/uL (ref 150.0–400.0)
RBC: 5.3 Mil/uL (ref 4.22–5.81)
RDW: 14.6 % (ref 11.5–15.5)
WBC: 8.1 10*3/uL (ref 4.0–10.5)

## 2015-01-16 LAB — BASIC METABOLIC PANEL
BUN: 11 mg/dL (ref 6–23)
CALCIUM: 9.4 mg/dL (ref 8.4–10.5)
CO2: 29 meq/L (ref 19–32)
CREATININE: 0.83 mg/dL (ref 0.40–1.50)
Chloride: 106 mEq/L (ref 96–112)
GFR: 104.48 mL/min (ref >60.00)
GLUCOSE: 91 mg/dL (ref 70–99)
Potassium: 4.5 mEq/L (ref 3.5–5.1)
Sodium: 143 mEq/L (ref 135–145)

## 2015-01-16 NOTE — Progress Notes (Signed)
HPI:  Benjamin Mcdowell is a 50 yo M pt with chronic LE edema (seen by vascular specialist),  recurrent R LE edema and cellulitis, recently hospitalized 12/15-12/22/16 for cellulitis with sepsis and ARI, managed by dermatology and ID outpatient after discharge, here today for assistance with a letter to return to work. He reports he feels great and has a new job interview today. He feels he can go back to work. His wife wanted him to stay home the rest of the week. He denies any remaining open wounds, pain, fevers, malaise. He has follow up with ID next week. He has persistent LE edema in the L leg. In the past vascular advised compression but he has not done this.  ROS: See pertinent positives and negatives per HPI.  Past Medical History  Diagnosis Date  . Anal fissure     "lanced it w/my hemorrhoids"  . Cellulitis     recurrent cellulitis right foot-per patient-per patient in hospital for IV antibiotics 01/2012  . Edema     LE, R>L since child hood  . Hemorrhoids   . Cancer of skin of back     "don't know which kind"  . Cellulitis of right lower extremity hospitalized 12/21/2014    Past Surgical History  Procedure Laterality Date  . Penis revascularization surgery    . Umbilical hernia repair  12/24/2007    with Proceed ventral  . Hernia repair    . Hemorrhoid surgery  1985    "had them lanced; also lanced anal fissure"    Family History  Problem Relation Age of Onset  . Prostate cancer Father   . Heart disease      GRANDFATHER    Social History   Social History  . Marital Status: Married    Spouse Name: N/A  . Number of Children: N/A  . Years of Education: N/A   Social History Main Topics  . Smoking status: Never Smoker   . Smokeless tobacco: Never Used  . Alcohol Use: 3.6 oz/week    6 Cans of beer, 0 Standard drinks or equivalent per week  . Drug Use: Yes    Special: Marijuana     Comment: 12/21/2014 "couple times q 3-4 months"  . Sexual Activity: Yes   Other  Topics Concern  . 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  EXAMDanley Danker Vitals:   01/16/15 1100 01/16/15 1128  BP: 128/92 122/90  Pulse: 100   Temp: 98.5 F (36.9 C)     Body mass index is 34.75 kg/(m^2).  GENERAL: vitals reviewed and listed above, alert, oriented, appears well hydrated and in no acute distress  HEENT: atraumatic, conjunttiva clear, no obvious abnormalities on inspection of external nose and ears  NECK: no obvious masses on inspection  LUNGS: clear to auscultation bilaterally, no wheezes, rales or rhonchi, good air movement  CV: HRRR, R Le edema to upper calve, good cap refill  SKIN: MUCH improved appearance of skin without any open wounds, patches of hyperpigmented skin that appear postinflamatory on foot and ankle, no induration, warmth   MS: moves all extremities without noticeable abnormality  PSYCH: pleasant and  cooperative, no obvious depression or anxiety  ASSESSMENT AND PLAN:  Discussed the following assessment and plan:  Cellulitis of right lower extremity - Plan: CBC with Differential  CHRONIC Edema of right lower extremity  Acute kidney injury ([place]) - Plan: Basic metabolic panel  -labs today, if renal function ok may do low dose lasix for a few days to assist with edema -also advised elevation 30 minutes twice daily, compression and f/u with his vascular doc if not helping -opted to allow him to return to work unless his specialist advised otherwise and prompt reeeval if any worsening -advised importance of elevation and compression to prevent recurrence of infections given his know venous insufficiency,rx for compression provided as he was unsure of what happened with rx from his  vascular doc -Patient advised to return or notify a doctor immediately if symptoms worsen or persist or new concerns arise.  Patient Instructions  BEFORE YOU LEAVE: -labs -letter for work -follow up in 3 months  Advise follow up with specialists as we discussed, elevation for 30 minutes twice daily and compression.  If kidney function looks ok will try short course of medication to help with the swelling.  Follow up immediately if worsening or not continuing to improve.       Colin Benton R.

## 2015-01-16 NOTE — Patient Instructions (Signed)
BEFORE YOU LEAVE: -labs -letter for work -follow up in 3 months  Advise follow up with specialists as we discussed, elevation for 30 minutes twice daily and compression.  If kidney function looks ok will try short course of medication to help with the swelling.  Follow up immediately if worsening or not continuing to improve.

## 2015-01-16 NOTE — Progress Notes (Signed)
Pre visit review using our clinic review tool, if applicable. No additional management support is needed unless otherwise documented below in the visit note. 

## 2015-01-17 ENCOUNTER — Inpatient Hospital Stay: Payer: Managed Care, Other (non HMO) | Admitting: Infectious Disease

## 2015-01-22 ENCOUNTER — Inpatient Hospital Stay: Payer: Managed Care, Other (non HMO) | Admitting: Infectious Disease

## 2015-01-22 MED ORDER — FUROSEMIDE 20 MG PO TABS: ORAL_TABLET | ORAL | Status: DC

## 2015-01-22 NOTE — Addendum Note (Signed)
Addended by: Agnes Lawrence on: 01/22/2015 02:27 PM   Modules accepted: Orders

## 2015-01-29 ENCOUNTER — Encounter: Payer: Self-pay | Admitting: Infectious Disease

## 2015-01-29 ENCOUNTER — Ambulatory Visit (INDEPENDENT_AMBULATORY_CARE_PROVIDER_SITE_OTHER): Payer: 59 | Admitting: Infectious Disease

## 2015-01-29 VITALS — BP 126/90 | HR 82 | Temp 98.2°F | Ht 73.0 in | Wt 259.0 lb

## 2015-01-29 DIAGNOSIS — T50995A Adverse effect of other drugs, medicaments and biological substances, initial encounter: Secondary | ICD-10-CM

## 2015-01-29 DIAGNOSIS — A419 Sepsis, unspecified organism: Secondary | ICD-10-CM

## 2015-01-29 DIAGNOSIS — L03115 Cellulitis of right lower limb: Secondary | ICD-10-CM

## 2015-01-29 DIAGNOSIS — L959 Vasculitis limited to the skin, unspecified: Secondary | ICD-10-CM

## 2015-01-29 DIAGNOSIS — L270 Generalized skin eruption due to drugs and medicaments taken internally: Secondary | ICD-10-CM

## 2015-01-29 DIAGNOSIS — R6520 Severe sepsis without septic shock: Secondary | ICD-10-CM

## 2015-01-29 DIAGNOSIS — L52 Erythema nodosum: Secondary | ICD-10-CM

## 2015-01-29 MED ORDER — CEPHALEXIN 500 MG PO CAPS: 500.0000 mg | ORAL_CAPSULE | Freq: Four times a day (QID) | ORAL | Status: DC

## 2015-01-29 NOTE — Progress Notes (Signed)
Chief complaint: left leg edema  Subjective:    Patient ID: Benjamin Mcdowell, male    DOB: 1965-03-04, 51 y.o.   MRN: [id number]  HPI  50 year old with history of recurrent cellulitis with 7 episodes since teenage years, 2 in past 2 years both resulting in hospitalizations. During his most recent hospitalization he developed a purpuric rash approximately 24 hours into his stay while on antibiotic therapy with vanomycin then changed to IV teflaro and clindamycin. We ultimately changed him to zyvox and he underwent biopsy of his lesions.  Biopsy showed:  FLORID URTICARIAL ALLERGIC REACTION (HYPERSENSITIVITY REACTION) WITH CHANGES OF ERYTHEMA NODOSUM  He had improvement in the appearance of his leg with both continued zyvox along with corticosteroids.  He has persistent rash though much better than when I saw him as an inpatient.  He has been seen twice by Dr Elvera Lennox at [facility].  He is without fevers, malaise nausea or systemic symptoms.  Past Medical History  Diagnosis Date  . Anal fissure     "lanced it w/my hemorrhoids"  . Cellulitis     recurrent cellulitis right foot-per patient-per patient in hospital for IV antibiotics 01/2012  . Edema     LE, R>L since child hood  . Hemorrhoids   . Cancer of skin of back     "don't know which kind"  . Cellulitis of right lower extremity hospitalized 12/21/2014    Past Surgical History  Procedure Laterality Date  . Penis revascularization surgery    . Umbilical hernia repair  12/24/2007    with Proceed ventral  . Hernia repair    . Hemorrhoid surgery  1985    "had them lanced; also lanced anal fissure"    Family History  Problem Relation Age of Onset  . Prostate cancer Father   . Heart disease      GRANDFATHER      Social History   Social History  . Marital Status: Married    Spouse Name: N/A  . Number of Children: N/A  . Years of Education: N/A   Social History Main Topics  . Smoking status: Never  Smoker   . Smokeless tobacco: Never Used  . Alcohol Use: 3.6 oz/week    6 Cans of beer, 0 Standard drinks or equivalent per week  . Drug Use: Yes    Special: Marijuana     Comment: 12/21/2014 "couple times q 3-4 months"  . Sexual Activity: Yes   Other Topics Concern  . 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  furosemide (LASIX) 20 MG tablet, Take 1 daily for 3 days, may repeat as needed for swelling., Disp: 20 tablet, Rfl: 3 .  cephALEXin (KEFLEX) 500 MG capsule, Take 1 capsule (500 mg total) by mouth 4 (four) times daily., Disp: 56 capsule, Rfl: 4   Review of Systems  Constitutional: Negative for fever, chills, diaphoresis, activity change, appetite change, fatigue and unexpected weight change.  HENT: Negative for congestion, rhinorrhea, sinus pressure, sneezing, sore throat and trouble swallowing.   Eyes: Negative for photophobia and visual disturbance.  Respiratory: Negative for cough, chest tightness, shortness  of breath, wheezing and stridor.   Cardiovascular: Negative for chest pain, palpitations and leg swelling.  Gastrointestinal: Negative for nausea, vomiting, abdominal pain, diarrhea, constipation, blood in stool, abdominal distention and anal bleeding.  Genitourinary: Negative for dysuria, hematuria, flank pain and difficulty urinating.  Musculoskeletal: Negative for myalgias, back pain, joint swelling, arthralgias and gait problem.  Skin: Positive for rash. Negative for color change, pallor and wound.  Neurological: Negative for dizziness, tremors, weakness and light-headedness.  Hematological: Negative for adenopathy. Does not bruise/bleed easily.  Psychiatric/Behavioral: Negative for behavioral  problems, confusion, sleep disturbance, dysphoric mood, decreased concentration and agitation.       Objective:   Physical Exam  Constitutional: He is oriented to person, place, and time. He appears well-developed and well-nourished.  HENT:  Head: Normocephalic and atraumatic.  Eyes: Conjunctivae and EOM are normal.  Neck: Normal range of motion. Neck supple.  Cardiovascular: Normal rate and regular rhythm.   Pulmonary/Chest: Effort normal. No respiratory distress. He has no wheezes.  Abdominal: Soft. He exhibits no distension.  Musculoskeletal: Normal range of motion. He exhibits edema. He exhibits no tenderness.  Neurological: He is alert and oriented to person, place, and time.  Skin: Skin is warm and dry. Rash noted. There is erythema.  Psychiatric: He has a normal mood and affect. His behavior is normal. Judgment and thought content normal.    01/29/15: right leg still edematous and with purpuric lesions             Assessment & Plan:   #1 Recurrent cellulitis with recent purpuric rash c/w vasculitic process: Per the patient Dr. Elvera Lennox feels the entire process was most likely driven by infection.   Regardless it was certainly a strange presentation especially given that this rash only occurred on the leg with cellulitis and not on the contralateral leg or anywhere else on his body  For now continue compressive stockings diuretics.   I have given him a rx for keflex as soon as he experiences symptoms of recurrent cellulitis.  I spent greater than 25 minutes with the patient including greater than 50% of time in face to face counsel of the patient re his recurrent cellulitis, vasculitis and in coordination of his care.

## 2015-03-16 ENCOUNTER — Encounter: Payer: Self-pay | Admitting: Family Medicine

## 2015-03-16 ENCOUNTER — Ambulatory Visit (INDEPENDENT_AMBULATORY_CARE_PROVIDER_SITE_OTHER): Payer: 59 | Admitting: Family Medicine

## 2015-03-16 VITALS — BP 122/90 | HR 83 | Temp 98.6°F | Ht 71.5 in | Wt 264.7 lb

## 2015-03-16 DIAGNOSIS — E781 Pure hyperglyceridemia: Secondary | ICD-10-CM | POA: Diagnosis not present

## 2015-03-16 DIAGNOSIS — R600 Localized edema: Secondary | ICD-10-CM

## 2015-03-16 DIAGNOSIS — Z0000 Encounter for general adult medical examination without abnormal findings: Secondary | ICD-10-CM

## 2015-03-16 DIAGNOSIS — E66812 Obesity, class 2: Secondary | ICD-10-CM

## 2015-03-16 DIAGNOSIS — Z23 Encounter for immunization: Secondary | ICD-10-CM

## 2015-03-16 DIAGNOSIS — R739 Hyperglycemia, unspecified: Secondary | ICD-10-CM

## 2015-03-16 DIAGNOSIS — E669 Obesity, unspecified: Secondary | ICD-10-CM

## 2015-03-16 LAB — BASIC METABOLIC PANEL
BUN: 13 mg/dL (ref 6–23)
CO2: 31 mEq/L (ref 19–32)
Calcium: 9.2 mg/dL (ref 8.4–10.5)
Chloride: 106 mEq/L (ref 96–112)
Creatinine, Ser: 1 mg/dL (ref 0.40–1.50)
GFR: 84.21 mL/min (ref >60.00)
Glucose, Bld: 107 mg/dL — ABNORMAL HIGH (ref 70–99)
POTASSIUM: 4.2 meq/L (ref 3.5–5.1)
SODIUM: 143 meq/L (ref 135–145)

## 2015-03-16 LAB — LIPID PANEL
CHOL/HDL RATIO: 4
Cholesterol: 150 mg/dL (ref 0–200)
HDL: 40 mg/dL (ref >39.00)
LDL CALC: 83 mg/dL (ref 0–99)
NONHDL: 110.16
Triglycerides: 134 mg/dL (ref 0.0–149.0)
VLDL: 26.8 mg/dL (ref 0.0–40.0)

## 2015-03-16 MED ORDER — FUROSEMIDE 20 MG PO TABS: ORAL_TABLET | ORAL | Status: DC

## 2015-03-16 NOTE — Patient Instructions (Signed)
BEFORE YOU LEAVE: -recheck BP, if elevated lasix 20mg  daily and follow up in 1 month -Tdap -labs -follow up in 3 months  We recommend the following healthy lifestyle measures: - eat a healthy whole foods diet consisting of regular small meals composed of vegetables, fruits, beans, nuts, seeds, healthy meats such as white chicken and fish and whole grains.  - avoid sweets, white starchy foods, fried foods, fast food, processed foods, sodas, red meet and other fattening foods.  - get a least 150-300 minutes of aerobic exercise per week.   Consider options for colon cancer screening. Check with your insurance company on cologuard. Call when you turn 50 to let use know what you want to do.

## 2015-03-16 NOTE — Progress Notes (Signed)
HPI:  Here for CPE:  -Concerns and/or follow up today:   Chronic R LE edema: -hx recurrent cellulitis -reports using compression -not elevating -has not used the lasix  -Diet: variety of foods, balance and well rounded, larger portion sizes  -Exercise: no regular exercise  -Diabetes and Dyslipidemia Screening: fASTING for labs today  -Hx of HTN: no  -Vaccines: needs tetanus booster  -sexual activity: yes, male partner, no new partners  -wants STI testing, Hep C screening (if born 26-1965): no  -FH colon or prstate ca: see FH Last colon cancer screening: he will consider options for approaching 50th bday Last prostate ca screening: discuss risks/benefits, consider PSA at 55  -Alcohol, Tobacco, drug use: see social history  Review of Systems - no fevers, unintentional weight loss, vision loss, hearing loss, chest pain, sob, hemoptysis, melena, hematochezia, hematuria, genital discharge, changing or concerning skin lesions, bleeding, bruising, loc, thoughts of self harm or SI  Past Medical History  Diagnosis Date  . Anal fissure     "lanced it w/my hemorrhoids"  . Cellulitis     recurrent cellulitis right foot-per patient-per patient in hospital for IV antibiotics 01/2012  . Edema     LE, R>L since child hood  . Hemorrhoids   . Cancer of skin of back     "don't know which kind"  . Cellulitis of right lower extremity hospitalized 12/21/2014  . Erythema nodosum   . Vasculitis of skin   . Allergic drug rash     Past Surgical History  Procedure Laterality Date  . Penis revascularization surgery    . Umbilical hernia repair  12/24/2007    with Proceed ventral  . Hernia repair    . Hemorrhoid surgery  1985    "had them lanced; also lanced anal fissure"    Family History  Problem Relation Age of Onset  . Prostate cancer Father   . Heart disease      GRANDFATHER    Social History   Social History  . Marital Status: Married    Spouse Name: N/A  .  Number of Children: N/A  . Years of Education: N/A   Social History Main Topics  . Smoking status: Never Smoker   . Smokeless tobacco: Never Used  . Alcohol Use: 3.6 oz/week    6 Cans of beer, 0 Standard drinks or equivalent per week  . Drug Use: Yes    Special: Marijuana     Comment: 12/21/2014 "couple times q 3-4 months"  . Sexual Activity: Yes   Other Topics Concern  . 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  furosemide (LASIX) 20 MG tablet, Take once daily in the morning., Disp: 30 tablet, Rfl: 3  EXAM:  Filed Vitals:   03/16/15 0829 03/16/15 0928  BP: 128/90 122/90  Pulse: 83   Temp: 98.6 F (37 C)   TempSrc: Oral   Height: 5' 11.5" (1.816 m)   Weight: 264 lb 11.2 oz (120.067 kg)     Estimated body mass index is 36.41 kg/(m^2) as calculated from the following:   Height as of this encounter: 5' 11.5" (1.816 m).   Weight  as of this encounter: 264 lb 11.2 oz (120.067 kg).  GENERAL: vitals reviewed and listed below, alert, oriented, appears well hydrated and in no acute distress  HEENT: head atraumatic, PERRLA, normal appearance of eyes, ears, nose and mouth. moist mucus membranes.  NECK: supple, no masses or lymphadenopathy  LUNGS: clear to auscultation bilaterally, no rales, rhonchi or wheeze  CV: HRRR, R LE edema, normal pedal pulses  ABDOMEN: bowel sounds normal, soft, non tender to palpation, no masses, no rebound or guarding  GU: declined  SKIN: numerous scattered small moles, none significantly different in appearance, patches of hiperpigmentation ith warmth or induration R LE, erythematous papule with central indentation and telangiectasias R nose  MS: normal gait, moves all extremities  normally  NEURO: CN II-XII grossly intact, normal muscle strength and sensation to light touch on extremities  PSYCH: normal affect, pleasant and cooperative  ASSESSMENT AND PLAN:  Discussed the following assessment and plan:  Visit for preventive health examination - Plan: Lipid Panel, Basic metabolic panel  CHRONIC Edema of right lower extremity  High triglycerides  Obesity, Class II, BMI 35-39.9, isolated (HCC)  Hyperglycemia  Need for Tdap vaccination - Plan: Tdap vaccine greater than or equal to 7yo IM  -Discussed and advised all [place] preventive services health task force level A and B recommendations for age, sex and risks.  -Advised at least 150 minutes of exercise per week and a healthy diet low in saturated fats and sweets and consisting of fresh fruits and vegetables, lean meats such as fish and white chicken and whole grains.  -labs, studies and vaccines per orders this encounter  -advised derm eval of lesion on nose, he thinks has been there many year, but possible changing query nevus vs BCC  -advised elevation and compression for leg, lasix 20mg  daily for edema and mild HTN.  Patient advised to return to clinic immediately if symptoms worsen or persist or new concerns.  Patient Instructions  BEFORE YOU LEAVE: -recheck BP, if elevated lasix 20mg  daily and follow up in 1 month -Tdap -labs -follow up in 3 months  We recommend the following healthy lifestyle measures: - eat a healthy whole foods diet consisting of regular small meals composed of vegetables, fruits, beans, nuts, seeds, healthy meats such as white chicken and fish and whole grains.  - avoid sweets, white starchy foods, fried foods, fast food, processed foods, sodas, red meet and other fattening foods.  - get a least 150-300 minutes of aerobic exercise per week.   Consider options for colon cancer screening. Check with your insurance company on cologuard. Call when you turn 50 to let use know what  you want to do.    No Follow-up on file.   Benjamin Mcdowell R.

## 2015-03-16 NOTE — Progress Notes (Signed)
Pre visit review using our clinic review tool, if applicable. No additional management support is needed unless otherwise documented below in the visit note. 

## 2015-04-16 ENCOUNTER — Ambulatory Visit: Payer: 59 | Admitting: Family Medicine

## 2015-04-18 ENCOUNTER — Encounter: Payer: Self-pay | Admitting: Family Medicine

## 2015-04-18 ENCOUNTER — Ambulatory Visit (INDEPENDENT_AMBULATORY_CARE_PROVIDER_SITE_OTHER): Payer: 59 | Admitting: Family Medicine

## 2015-04-18 VITALS — BP 118/86 | HR 75 | Temp 97.5°F | Ht 71.5 in | Wt 265.1 lb

## 2015-04-18 DIAGNOSIS — R739 Hyperglycemia, unspecified: Secondary | ICD-10-CM | POA: Diagnosis not present

## 2015-04-18 DIAGNOSIS — R600 Localized edema: Secondary | ICD-10-CM | POA: Diagnosis not present

## 2015-04-18 DIAGNOSIS — E669 Obesity, unspecified: Secondary | ICD-10-CM

## 2015-04-18 DIAGNOSIS — R030 Elevated blood-pressure reading, without diagnosis of hypertension: Secondary | ICD-10-CM

## 2015-04-18 DIAGNOSIS — IMO0001 Reserved for inherently not codable concepts without codable children: Secondary | ICD-10-CM

## 2015-04-18 NOTE — Progress Notes (Signed)
Pre visit review using our clinic review tool, if applicable. No additional management support is needed unless otherwise documented below in the visit note. 

## 2015-04-18 NOTE — Progress Notes (Signed)
HPI:  Follow up HTN: -started lasix 20mg  daily given his hx sig LE edema -reports: has been taking the lasix intermittently -denies: increased swelling, CP, HA  Obesity/mild hyperglycemia: -lifestyle not great -he wonders how a healthy diet and exercise can help his blood pressure  ROS: See pertinent positives and negatives per HPI.  Past Medical History  Diagnosis Date  . Anal fissure     "lanced it w/my hemorrhoids"  . Cellulitis     recurrent cellulitis right foot-per patient-per patient in hospital for IV antibiotics 01/2012  . Edema     LE, R>L since child hood  . Hemorrhoids   . Cancer of skin of back     "don't know which kind"  . Cellulitis of right lower extremity hospitalized 12/21/2014  . Erythema nodosum   . Vasculitis of skin   . Allergic drug rash     Past Surgical History  Procedure Laterality Date  . Penis revascularization surgery    . Umbilical hernia repair  12/24/2007    with Proceed ventral  . Hernia repair    . Hemorrhoid surgery  1985    "had them lanced; also lanced anal fissure"    Family History  Problem Relation Age of Onset  . Prostate cancer Father   . Heart disease      GRANDFATHER    Social History   Social History  . Marital Status: Married    Spouse Name: N/A  . Number of Children: N/A  . Years of Education: N/A   Social History Main Topics  . Smoking status: Never Smoker   . Smokeless tobacco: Never Used  . Alcohol Use: 3.6 oz/week    6 Cans of beer, 0 Standard drinks or equivalent per week  . Drug Use: Yes    Special: Marijuana     Comment: 12/21/2014 "couple times q 3-4 months"  . Sexual Activity: Yes   Other Topics Concern  . None   Social History Narrative   Work or School: Insurance account manager Situation: lives with wife and 81 yo twins in 2016      Spiritual Beliefs: non practicing Christian      Lifestyle: started exercise and diet changes in summer 2016           Current outpatient  prescriptions:  .  furosemide (LASIX) 20 MG tablet, Take once daily in the morning., Disp: 30 tablet, Rfl: 3  EXAM:  Filed Vitals:   04/18/15 0812  BP: 118/86  Pulse: 75  Temp: 97.5 F (36.4 C)    Body mass index is 36.46 kg/(m^2).  GENERAL: vitals reviewed and listed above, alert, oriented, appears well hydrated and in no acute distress  HEENT: atraumatic, conjunttiva clear, no obvious abnormalities on inspection of external nose and ears  NECK: no obvious masses on inspection  LUNGS: clear to auscultation bilaterally, no wheezes, rales or rhonchi, good air movement  CV: HRRR, no peripheral edema  MS: moves all extremities without noticeable abnormality  PSYCH: pleasant and cooperative, no obvious depression or anxiety  ASSESSMENT AND PLAN:  Discussed the following assessment and plan:  Elevated blood pressure  CHRONIC Edema of right lower extremity  Hyperglycemia  Obesity, Class II, BMI 35-39.9, isolated (HCC)  -did not take the lasix this week, discussed risks and benefits - BP ok today but he reports it is borderline when he checks it - he may consider taking on regular basis and we will continue to monitor the blood  pressure we will plan to check BMP if he does decide to take on a regular basis -Lifestyle recommendations and benefits discussed -cologuard information provided - he wants to consider as will be turning 50 this summer -Follow up in 4 months  -Patient advised to return or notify a doctor immediately if symptoms worsen or persist or new concerns arise.  Patient Instructions  Before you leave: -Information regarding Cologuard testing -Schedule follow-up in about 4 months to recheck your blood pressure  Lasix 20 mg daily may help with the swelling and your borderline blood pressure.  We recommend the following healthy lifestyle measures: - eat a healthy whole foods diet consisting of regular small meals composed of vegetables, fruits, beans, nuts,  seeds, healthy meats such as white chicken and fish and whole grains.  - avoid sweets, white starchy foods, fried foods, fast food, processed foods, sodas, red meet and other fattening foods.  - get a least 150-300 minutes of aerobic exercise per week.       Colin Benton R.

## 2015-04-18 NOTE — Patient Instructions (Signed)
Before you leave: -Information regarding Cologuard testing -Schedule follow-up in about 4 months to recheck your blood pressure  Lasix 20 mg daily may help with the swelling and your borderline blood pressure.  We recommend the following healthy lifestyle measures: - eat a healthy whole foods diet consisting of regular small meals composed of vegetables, fruits, beans, nuts, seeds, healthy meats such as white chicken and fish and whole grains.  - avoid sweets, white starchy foods, fried foods, fast food, processed foods, sodas, red meet and other fattening foods.  - get a least 150-300 minutes of aerobic exercise per week.

## 2015-05-30 ENCOUNTER — Ambulatory Visit: Payer: 59 | Admitting: Infectious Disease

## 2015-08-19 NOTE — Progress Notes (Signed)
HPI:  Follow up HTN/Chronic LE edema: -advised lasix 20mg  daily given his hx sig R LE edema and intermittently elevated BP -reports: has been taking the lasix intermittently 1-2 times per week elevates leg -denies: increased swelling, CP, HA  Obesity/mild hyperglycemia: -lifestyle not great  ROS: See pertinent positives and negatives per HPI.  Past Medical History:  Diagnosis Date  . Allergic drug rash   . Anal fissure    "lanced it w/my hemorrhoids"  . Cancer of skin of back    "don't know which kind"  . Cellulitis    recurrent cellulitis right foot-per patient-per patient in hospital for IV antibiotics 01/2012  . Cellulitis of right lower extremity hospitalized 12/21/2014  . Edema    LE, R>L since child hood  . Erythema nodosum   . Hemorrhoids   . Vasculitis of skin     Past Surgical History:  Procedure Laterality Date  . [place]   "had them lanced; also lanced anal fissure"  . HERNIA REPAIR    . PENIS REVASCULARIZATION SURGERY    . UMBILICAL HERNIA REPAIR  12/24/2007   with Proceed ventral    Family History  Problem Relation Age of Onset  . Prostate cancer Father   . Heart disease      GRANDFATHER    Social History   Social History  . Marital status: Married    Spouse name: N/A  . Number of children: N/A  . Years of education: N/A   Social History Main Topics  . Smoking status: Never Smoker  . Smokeless tobacco: Never Used  . Alcohol use 3.6 oz/week    6 Cans of beer per week  . Drug use:     Types: Marijuana     Comment: 12/21/2014 "couple times q 3-4 months"  . Sexual activity: Yes   Other Topics Concern  . 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  furosemide (LASIX) 20  MG tablet, Take once daily in the morning., Disp: 90 tablet, Rfl: 3  EXAM:  Vitals:   08/20/15 0910  BP: 124/90  Pulse: 77  Temp: 98.6 F (37 C)    Body mass index is 39.15 kg/m.  GENERAL: vitals reviewed and listed above, alert, oriented, appears well hydrated and in no acute distress  HEENT: atraumatic, conjunttiva clear, no obvious abnormalities on inspection of external nose and ears  NECK: no obvious masses on inspection  LUNGS: clear to auscultation bilaterally, no wheezes, rales or rhonchi, good air movement  CV: HRRR, R LE edema  MS: moves all extremities without noticeable abnormality  PSYCH: pleasant and cooperative, no obvious depression or anxiety  ASSESSMENT AND PLAN:  Discussed the following assessment and plan:  Elevated BP  CHRONIC Edema of right lower extremity  Obesity, Class II, BMI 35-39.9, isolated (HCC)  -lifestyle recs -he opted to start lasix daily and start working on lifestyle changes with labs at follow up -cologuard -Patient advised to return or notify a doctor immediately if symptoms worsen or persist or new concerns arise.  Patient Instructions  BEFORE YOU LEAVE: -follow up: 3 months, will plan to do labs then -cologuard information  Check to see if your insurance covers th cologuard. Let us know if you wish to do this.  Start taking the Lasix 20mg  every morning.  We recommend the following healthy lifestyle: 1) Small portions - eat off of salad plate instead of dinner plate 2) Eat a healthy clean diet with avoidance of (less then 1 serving per week) processed foods, sweetened drinks, white starches, red meat, fast foods and sweets and consisting of: * 5-9 servings per day of fresh or frozen fruits and vegetables (not corn or potatoes, not dried or canned) *nuts and seeds, beans *olives and olive oil *small portions of lean meats such as fish and white chicken  *small portions of whole grains 3)Get at least 150 minutes of  sweaty aerobic exercise per week 4)reduce stress - counseling, meditation, relaxation to balance other aspects of your life     Colin Benton R., DO

## 2015-08-20 ENCOUNTER — Ambulatory Visit (INDEPENDENT_AMBULATORY_CARE_PROVIDER_SITE_OTHER): Payer: 59 | Admitting: Family Medicine

## 2015-08-20 ENCOUNTER — Encounter: Payer: Self-pay | Admitting: Family Medicine

## 2015-08-20 VITALS — BP 124/90 | HR 77 | Temp 98.6°F | Ht 71.5 in | Wt 284.7 lb

## 2015-08-20 DIAGNOSIS — R030 Elevated blood-pressure reading, without diagnosis of hypertension: Secondary | ICD-10-CM | POA: Diagnosis not present

## 2015-08-20 DIAGNOSIS — E669 Obesity, unspecified: Secondary | ICD-10-CM

## 2015-08-20 DIAGNOSIS — IMO0001 Reserved for inherently not codable concepts without codable children: Secondary | ICD-10-CM

## 2015-08-20 DIAGNOSIS — R600 Localized edema: Secondary | ICD-10-CM

## 2015-08-20 MED ORDER — FUROSEMIDE 20 MG PO TABS: ORAL_TABLET | ORAL | 3 refills | Status: DC

## 2015-08-20 NOTE — Progress Notes (Signed)
Pre visit review using our clinic review tool, if applicable. No additional management support is needed unless otherwise documented below in the visit note. 

## 2015-08-20 NOTE — Patient Instructions (Signed)
BEFORE YOU LEAVE: -follow up: 3 months, will plan to do labs then -cologuard information  Check to see if your insurance covers th cologuard. Let us know if you wish to do this.  Start taking the Lasix 20mg  every morning.  We recommend the following healthy lifestyle: 1) Small portions - eat off of salad plate instead of dinner plate 2) Eat a healthy clean diet with avoidance of (less then 1 serving per week) processed foods, sweetened drinks, white starches, red meat, fast foods and sweets and consisting of: * 5-9 servings per day of fresh or frozen fruits and vegetables (not corn or potatoes, not dried or canned) *nuts and seeds, beans *olives and olive oil *small portions of lean meats such as fish and white chicken  *small portions of whole grains 3)Get at least 150 minutes of sweaty aerobic exercise per week 4)reduce stress - counseling, meditation, relaxation to balance other aspects of your life

## 2015-12-17 ENCOUNTER — Ambulatory Visit (INDEPENDENT_AMBULATORY_CARE_PROVIDER_SITE_OTHER): Payer: 59 | Admitting: Family Medicine

## 2015-12-17 ENCOUNTER — Encounter: Payer: Self-pay | Admitting: Family Medicine

## 2015-12-17 VITALS — BP 120/84 | HR 90 | Temp 98.7°F | Ht 71.5 in | Wt 288.2 lb

## 2015-12-17 DIAGNOSIS — H1031 Unspecified acute conjunctivitis, right eye: Secondary | ICD-10-CM

## 2015-12-17 MED ORDER — ERYTHROMYCIN 5 MG/GM OP OINT: 1.0000 "application " | TOPICAL_OINTMENT | Freq: Every day | OPHTHALMIC | 0 refills | Status: DC

## 2015-12-17 NOTE — Progress Notes (Signed)
HPI:  Acute visit for:  Conjunctivitis: -started about 1 week ago -r eye -no trauma -irritated, itchy, some draining -vision ok  ROS: See pertinent positives and negatives per HPI.  Past Medical History:  Diagnosis Date  . Allergic drug rash   . Anal fissure    "lanced it w/my hemorrhoids"  . Cancer of skin of back    "don't know which kind"  . Cellulitis    recurrent cellulitis right foot-per patient-per patient in hospital for IV antibiotics 01/2012  . Cellulitis of right lower extremity hospitalized 12/21/2014  . Edema    LE, R>L since child hood  . Erythema nodosum   . Hemorrhoids   . Vasculitis of skin     Past Surgical History:  Procedure Laterality Date  . [place]   "had them lanced; also lanced anal fissure"  . HERNIA REPAIR    . PENIS REVASCULARIZATION SURGERY    . UMBILICAL HERNIA REPAIR  12/24/2007   with Proceed ventral    Family History  Problem Relation Age of Onset  . Prostate cancer Father   . Heart disease      GRANDFATHER    Social History   Social History  . Marital status: Married    Spouse name: N/A  . Number of children: N/A  . Years of education: N/A   Social History Main Topics  . Smoking status: Never Smoker  . Smokeless tobacco: Never Used  . Alcohol use 3.6 oz/week    6 Cans of beer per week  . Drug use:     Types: Marijuana     Comment: 12/21/2014 "couple times q 3-4 months"  . Sexual activity: Yes   Other Topics Concern  . 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  furosemide (LASIX) 20 MG tablet, Take once daily in the morning., Disp: 90 tablet, Rfl: 3 .  erythromycin ophthalmic ointment, Place 1 application into the left eye at bedtime., Disp:  3.5 g, Rfl: 0  EXAM:  Vitals:   12/17/15 1352  BP: 120/84  Pulse: 90  Temp: 98.7 F (37.1 C)    Body mass index is 39.64 kg/m.  GENERAL: vitals reviewed and listed above, alert, oriented, appears well hydrated and in no acute distress  HEENT: atraumatic, conjunttiva clear except erythematous R, drainage R eye, PERRLA, EOMI, visual acuity grossly intact, no signs of trauma, no foreign body or corneal disruption noted on limited exam,  no obvious abnormalities on inspection of external nose and ears  NECK: no obvious masses on inspection  LUNGS: clear to auscultation bilaterally, no wheezes, rales or rhonchi, good air movement  CV: HRRR, no peripheral edema  MS: moves all extremities without noticeable abnormality  PSYCH: pleasant and cooperative, no obvious depression or anxiety  ASSESSMENT AND PLAN:  Discussed the following assessment and plan:  Acute bacterial conjunctivitis of right eye  -we discussed possible serious and likely etiologies, workup and treatment, treatment risks and return precautions -abx oint -return and emergency precuations -Patient advised to return or notify a doctor immediately if symptoms worsen or persist or new concerns arise.  Patient Instructions  BEFORE YOU LEAVE: -  follow up: as scheduled -order cologuard  Eye ointment nightly before bed.  Compresses.  Do not touch eye, wash hands frequently  See eye doctor if symptoms worsen or persist.    Colin Benton R., DO

## 2015-12-17 NOTE — Progress Notes (Signed)
Pre visit review using our clinic review tool, if applicable. No additional management support is needed unless otherwise documented below in the visit note. 

## 2015-12-17 NOTE — Patient Instructions (Signed)
BEFORE YOU LEAVE: -follow up: as scheduled -order cologuard  Eye ointment nightly before bed.  Compresses.  Do not touch eye, wash hands frequently  See eye doctor if symptoms worsen or persist.

## 2015-12-20 ENCOUNTER — Ambulatory Visit: Payer: 59 | Admitting: Family Medicine

## 2015-12-26 NOTE — Progress Notes (Signed)
HPI:  Benjamin Mcdowell is a pleasant 50 yo with a PMH significant for Obesity, Hyperglycemia, Hypertriglyceridemia and chronic lower extremity edema of the R leg here for follow up. Reports doing well. Leg edema stable, though has not been as good about compression because he lost his compression sock - will be ordering more.taking lasix 20mg  daily. Denies CP, SOB, DOE, increased swelling, skin changes. Due for colon cancer screening, flu shot and labs. ROS: See pertinent positives and negatives per HPI.  Past Medical History:  Diagnosis Date  . Allergic drug rash   . Anal fissure    "lanced it w/my hemorrhoids"  . Cancer of skin of back    "don't know which kind"  . Cellulitis    recurrent cellulitis right foot-per patient-per patient in hospital for IV antibiotics 01/2012  . Cellulitis of right lower extremity hospitalized 12/21/2014  . Edema    LE, R>L since child hood  . Erythema nodosum   . Hemorrhoids   . Vasculitis of skin     Past Surgical History:  Procedure Laterality Date  . [place]   "had them lanced; also lanced anal fissure"  . HERNIA REPAIR    . PENIS REVASCULARIZATION SURGERY    . UMBILICAL HERNIA REPAIR  12/24/2007   with Proceed ventral    Family History  Problem Relation Age of Onset  . Prostate cancer Father   . Heart disease      GRANDFATHER    Social History   Social History  . Marital status: Married    Spouse name: N/A  . Number of children: N/A  . Years of education: N/A   Social History Main Topics  . Smoking status: Never Smoker  . Smokeless tobacco: Never Used  . Alcohol use 3.6 oz/week    6 Cans of beer per week  . Drug use:     Types: Marijuana     Comment: 12/21/2014 "couple times q 3-4 months"  . Sexual activity: Yes   Other Topics Concern  . 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  erythromycin ophthalmic ointment, Place 1 application into the left eye at bedtime., Disp: 3.5 g, Rfl: 0 .  furosemide (LASIX) 20 MG tablet, Take once daily in the morning., Disp: 90 tablet, Rfl: 3  EXAM:  Vitals:   12/27/15 0856  BP: 122/82  Pulse: 68  Temp: 98.5 F (36.9 C)    Body mass index is 39.24 kg/m.  GENERAL: vitals reviewed and listed above, alert, oriented, appears well hydrated and in no acute distress  HEENT: atraumatic, conjunttiva clear, no obvious abnormalities on inspection of external nose and ears  NECK: no obvious masses on inspection  LUNGS: clear to auscultation bilaterally, no wheezes, rales or rhonchi, good air movement  CV: HRRR, no peripheral edema  MS: moves all extremities without noticeable abnormality  PSYCH: pleasant and cooperative, no obvious depression or anxiety  ASSESSMENT AND PLAN:  Discussed the following assessment and plan:  Obesity, Class II, BMI 35-39.9, isolated  Hyperglycemia - Plan: Hemoglobin A1c  CHRONIC Edema of right lower extremity - Plan: Basic metabolic panel  High triglycerides  -  flu shot -labs -lifestyle recs -reviewed colon cancer screening options, assistant to order per his preferences -Patient advised to return or notify a doctor immediately if symptoms worsen or persist or new concerns arise.  Patient Instructions  BEFORE YOU LEAVE: -follow up: 4-6 months -flu shot -check on status of his colon cancer screening and see if he wants to do cologuard or colonoscopy -labs  We have ordered labs or studies at this visit. It can take up to 1-2 weeks for results and processing. IF results require follow up or explanation, we will call you with instructions. Clinically stable results will be released to your [name]. If you have not heard from [place] or cannot find your results in  [name] in 2 weeks please contact our office at [phone].  If you are not yet signed up for [name], please consider signing up.  Compression sock daily.   We recommend the following healthy lifestyle for LIFE: 1) Small portions.   Tip: eat off of a salad plate instead of a dinner plate.  Tip: It is ok to feel hungry after a meal - that likely means you ate an appropriate portion.  Tip: if you need more or a snack choose fruits, veggies and/or a handful of nuts or seeds.  2) Eat a healthy clean diet.  * Tip: Avoid (less then 1 serving per week): processed foods, sweets, sweetened drinks, white starches (rice, flour, bread, potatoes, pasta, etc), red meat, fast foods, butter  *Tip: CHOOSE instead   * 5-9 servings per day of fresh or frozen fruits and vegetables (but not corn, potatoes, bananas, canned or dried fruit)   *nuts and seeds, beans   *olives and olive oil   *small portions of lean meats such as fish and white chicken    *small portions of whole grains  3)Get at least 150 minutes of sweaty aerobic exercise per week.  4)Reduce stress - consider counseling, meditation and relaxation to balance other aspects of your life.            Colin Benton R., DO  s

## 2015-12-27 ENCOUNTER — Ambulatory Visit (INDEPENDENT_AMBULATORY_CARE_PROVIDER_SITE_OTHER): Payer: 59 | Admitting: Family Medicine

## 2015-12-27 ENCOUNTER — Encounter: Payer: Self-pay | Admitting: Family Medicine

## 2015-12-27 VITALS — BP 122/82 | HR 68 | Temp 98.5°F | Ht 71.5 in | Wt 285.3 lb

## 2015-12-27 DIAGNOSIS — Z23 Encounter for immunization: Secondary | ICD-10-CM

## 2015-12-27 DIAGNOSIS — E781 Pure hyperglyceridemia: Secondary | ICD-10-CM | POA: Diagnosis not present

## 2015-12-27 DIAGNOSIS — E669 Obesity, unspecified: Secondary | ICD-10-CM | POA: Diagnosis not present

## 2015-12-27 DIAGNOSIS — R739 Hyperglycemia, unspecified: Secondary | ICD-10-CM | POA: Diagnosis not present

## 2015-12-27 DIAGNOSIS — R600 Localized edema: Secondary | ICD-10-CM

## 2015-12-27 LAB — BASIC METABOLIC PANEL
BUN: 16 mg/dL (ref 6–23)
CALCIUM: 9.3 mg/dL (ref 8.4–10.5)
CO2: 33 mEq/L — ABNORMAL HIGH (ref 19–32)
CREATININE: 0.97 mg/dL (ref 0.40–1.50)
Chloride: 103 mEq/L (ref 96–112)
GFR: 86.95 mL/min (ref >60.00)
GLUCOSE: 98 mg/dL (ref 70–99)
Potassium: 4.5 mEq/L (ref 3.5–5.1)
SODIUM: 142 meq/L (ref 135–145)

## 2015-12-27 LAB — HEMOGLOBIN A1C: Hgb A1c MFr Bld: 5.7 % (ref 4.6–6.5)

## 2015-12-27 NOTE — Progress Notes (Signed)
Pre visit review using our clinic review tool, if applicable. No additional management support is needed unless otherwise documented below in the visit note. 

## 2015-12-27 NOTE — Patient Instructions (Signed)
BEFORE YOU LEAVE: -follow up: 4-6 months -flu shot -check on status of his colon cancer screening and see if he wants to do cologuard or colonoscopy -labs  We have ordered labs or studies at this visit. It can take up to 1-2 weeks for results and processing. IF results require follow up or explanation, we will call you with instructions. Clinically stable results will be released to your [facility]. If you have not heard from [place] or cannot find your results in [facility] in 2 weeks please contact our office at [phone].  If you are not yet signed up for [facility], please consider signing up.  Compression sock daily.   We recommend the following healthy lifestyle for LIFE: 1) Small portions.   Tip: eat off of a salad plate instead of a dinner plate.  Tip: It is ok to feel hungry after a meal - that likely means you ate an appropriate portion.  Tip: if you need more or a snack choose fruits, veggies and/or a handful of nuts or seeds.  2) Eat a healthy clean diet.  * Tip: Avoid (less then 1 serving per week): processed foods, sweets, sweetened drinks, white starches (rice, flour, bread, potatoes, pasta, etc), red meat, fast foods, butter  *Tip: CHOOSE instead   * 5-9 servings per day of fresh or frozen fruits and vegetables (but not corn, potatoes, bananas, canned or dried fruit)   *nuts and seeds, beans   *olives and olive oil   *small portions of lean meats such as fish and white chicken    *small portions of whole grains  3)Get at least 150 minutes of sweaty aerobic exercise per week.  4)Reduce stress - consider counseling, meditation and relaxation to balance other aspects of your life.

## 2016-05-02 ENCOUNTER — Encounter (HOSPITAL_COMMUNITY): Payer: Self-pay | Admitting: Emergency Medicine

## 2016-05-02 ENCOUNTER — Observation Stay (HOSPITAL_COMMUNITY): Payer: 59

## 2016-05-02 ENCOUNTER — Observation Stay (HOSPITAL_COMMUNITY)
Admission: EM | Admit: 2016-05-02 | Discharge: 2016-05-04 | Disposition: A | Discharge status: Home / Self Care | Payer: 59 | Attending: Internal Medicine | Admitting: Internal Medicine

## 2016-05-02 ENCOUNTER — Ambulatory Visit: Payer: 59 | Admitting: Family Medicine

## 2016-05-02 DIAGNOSIS — R17 Unspecified jaundice: Secondary | ICD-10-CM | POA: Diagnosis not present

## 2016-05-02 DIAGNOSIS — A419 Sepsis, unspecified organism: Secondary | ICD-10-CM | POA: Diagnosis not present

## 2016-05-02 DIAGNOSIS — L03115 Cellulitis of right lower limb: Principal | ICD-10-CM | POA: Insufficient documentation

## 2016-05-02 DIAGNOSIS — E669 Obesity, unspecified: Secondary | ICD-10-CM | POA: Insufficient documentation

## 2016-05-02 DIAGNOSIS — Z6839 Body mass index (BMI) 39.0-39.9, adult: Secondary | ICD-10-CM | POA: Insufficient documentation

## 2016-05-02 DIAGNOSIS — R509 Fever, unspecified: Secondary | ICD-10-CM | POA: Diagnosis present

## 2016-05-02 DIAGNOSIS — Z79899 Other long term (current) drug therapy: Secondary | ICD-10-CM | POA: Diagnosis not present

## 2016-05-02 HISTORY — DX: Sepsis, unspecified organism: A41.9

## 2016-05-02 LAB — COMPREHENSIVE METABOLIC PANEL
ALBUMIN: 4.1 g/dL (ref 3.5–5.0)
ALT: 105 U/L — ABNORMAL HIGH (ref 17–63)
ANION GAP: 9 (ref 5–15)
AST: 54 U/L — AB (ref 15–41)
Alkaline Phosphatase: 61 U/L (ref 38–126)
BUN: 10 mg/dL (ref 6–20)
CHLORIDE: 103 mmol/L (ref 101–111)
CO2: 23 mmol/L (ref 22–32)
Calcium: 9 mg/dL (ref 8.9–10.3)
Creatinine, Ser: 1.22 mg/dL (ref 0.61–1.24)
GFR calc Af Amer: >60 mL/min (ref >60)
GFR calc non Af Amer: >60 mL/min (ref >60)
GLUCOSE: 153 mg/dL — AB (ref 65–99)
POTASSIUM: 4.1 mmol/L (ref 3.5–5.1)
Sodium: 135 mmol/L (ref 135–145)
Total Bilirubin: 1.3 mg/dL — ABNORMAL HIGH (ref 0.3–1.2)
Total Protein: 6.8 g/dL (ref 6.5–8.1)

## 2016-05-02 LAB — CBC
HCT: 38.2 % — ABNORMAL LOW (ref 39.0–52.0)
Hemoglobin: 13 g/dL (ref 13.0–17.0)
MCH: 28 pg (ref 26.0–34.0)
MCHC: 34 g/dL (ref 30.0–36.0)
MCV: 82.2 fL (ref 78.0–100.0)
Platelets: 166 10*3/uL (ref 150–400)
RBC: 4.65 MIL/uL (ref 4.22–5.81)
RDW: 13.2 % (ref 11.5–15.5)
WBC: 15.8 10*3/uL — ABNORMAL HIGH (ref 4.0–10.5)

## 2016-05-02 LAB — CBC WITH DIFFERENTIAL/PLATELET
BLASTS: 0 %
Band Neutrophils: 0 %
Basophils Absolute: 0 10*3/uL (ref 0.0–0.1)
Basophils Relative: 0 %
Eosinophils Absolute: 0 10*3/uL (ref 0.0–0.7)
Eosinophils Relative: 0 %
HEMATOCRIT: 45 % (ref 39.0–52.0)
Hemoglobin: 15.2 g/dL (ref 13.0–17.0)
LYMPHS ABS: 0.4 10*3/uL — AB (ref 0.7–4.0)
LYMPHS PCT: 2 %
MCH: 27.7 pg (ref 26.0–34.0)
MCHC: 33.8 g/dL (ref 30.0–36.0)
MCV: 82.1 fL (ref 78.0–100.0)
MONOS PCT: 5 %
Metamyelocytes Relative: 0 %
Monocytes Absolute: 1 10*3/uL (ref 0.1–1.0)
Myelocytes: 0 %
NEUTROS ABS: 18.8 10*3/uL — AB (ref 1.7–7.7)
NEUTROS PCT: 93 %
NRBC: 0 /100{WBCs}
Other: 0 %
Platelets: 216 10*3/uL (ref 150–400)
Promyelocytes Absolute: 0 %
RBC: 5.48 MIL/uL (ref 4.22–5.81)
RDW: 13.4 % (ref 11.5–15.5)
WBC Morphology: INCREASED
WBC: 20.2 10*3/uL — AB (ref 4.0–10.5)

## 2016-05-02 LAB — CREATININE, SERUM
CREATININE: 1.09 mg/dL (ref 0.61–1.24)
GFR calc Af Amer: >60 mL/min (ref >60)

## 2016-05-02 LAB — LACTIC ACID, PLASMA
Lactic Acid, Venous: 1.7 mmol/L (ref 0.5–1.9)
Lactic Acid, Venous: 2.7 mmol/L (ref 0.5–1.9)
Lactic Acid, Venous: 1.3 mmol/L (ref 0.5–1.9)

## 2016-05-02 LAB — URINALYSIS, ROUTINE W REFLEX MICROSCOPIC
Bilirubin Urine: NEGATIVE
GLUCOSE, UA: NEGATIVE mg/dL
HGB URINE DIPSTICK: NEGATIVE
KETONES UR: NEGATIVE mg/dL
LEUKOCYTES UA: NEGATIVE
Nitrite: NEGATIVE
PH: 6 (ref 5.0–8.0)
PROTEIN: NEGATIVE mg/dL
Specific Gravity, Urine: 1.009 (ref 1.005–1.030)

## 2016-05-02 LAB — I-STAT CG4 LACTIC ACID, ED: LACTIC ACID, VENOUS: 3.04 mmol/L — AB (ref 0.5–1.9)

## 2016-05-02 LAB — BILIRUBIN, DIRECT: Bilirubin, Direct: 0.2 mg/dL (ref 0.1–0.5)

## 2016-05-02 MED ORDER — SODIUM CHLORIDE 0.9 % IV BOLUS (SEPSIS)
500.0000 mL | Freq: Once | INTRAVENOUS | Status: AC
  Administered 2016-05-02: 500 mL via INTRAVENOUS

## 2016-05-02 MED ORDER — VANCOMYCIN HCL IN DEXTROSE 1-5 GM/200ML-% IV SOLN
1000.0000 mg | Freq: Once | INTRAVENOUS | Status: AC
  Administered 2016-05-02: 1000 mg via INTRAVENOUS
  Filled 2016-05-02: qty 200

## 2016-05-02 MED ORDER — ENOXAPARIN SODIUM 40 MG/0.4ML ~~LOC~~ SOLN
40.0000 mg | SUBCUTANEOUS | Status: DC
  Administered 2016-05-02 – 2016-05-03 (×2): 40 mg via SUBCUTANEOUS
  Filled 2016-05-02 (×2): qty 0.4

## 2016-05-02 MED ORDER — ACETAMINOPHEN 500 MG PO TABS
1000.0000 mg | ORAL_TABLET | Freq: Once | ORAL | Status: AC
  Administered 2016-05-02: 1000 mg via ORAL
  Filled 2016-05-02: qty 2

## 2016-05-02 MED ORDER — VANCOMYCIN HCL IN DEXTROSE 1-5 GM/200ML-% IV SOLN
1000.0000 mg | Freq: Two times a day (BID) | INTRAVENOUS | Status: DC
  Administered 2016-05-03 – 2016-05-04 (×3): 1000 mg via INTRAVENOUS
  Filled 2016-05-02 (×4): qty 200

## 2016-05-02 MED ORDER — KETOROLAC TROMETHAMINE 30 MG/ML IJ SOLN: 30.0000 mg | Freq: Three times a day (TID) | INTRAMUSCULAR | Status: DC | PRN

## 2016-05-02 MED ORDER — VANCOMYCIN HCL IN DEXTROSE 1-5 GM/200ML-% IV SOLN: 1000.0000 mg | Freq: Once | INTRAVENOUS | Status: DC

## 2016-05-02 MED ORDER — ACETAMINOPHEN 325 MG PO TABS
650.0000 mg | ORAL_TABLET | Freq: Four times a day (QID) | ORAL | Status: DC | PRN
  Administered 2016-05-03: 650 mg via ORAL
  Filled 2016-05-02: qty 2

## 2016-05-02 MED ORDER — HYDROCODONE-ACETAMINOPHEN 5-325 MG PO TABS: 1.0000 | ORAL_TABLET | ORAL | Status: DC | PRN

## 2016-05-02 MED ORDER — ONDANSETRON HCL 4 MG PO TABS: 4.0000 mg | ORAL_TABLET | Freq: Four times a day (QID) | ORAL | Status: DC | PRN

## 2016-05-02 MED ORDER — ONDANSETRON HCL 4 MG/2ML IJ SOLN: 4.0000 mg | Freq: Four times a day (QID) | INTRAMUSCULAR | Status: DC | PRN

## 2016-05-02 MED ORDER — SODIUM CHLORIDE 0.9 % IV SOLN
INTRAVENOUS | Status: DC
  Administered 2016-05-02 – 2016-05-03 (×3): via INTRAVENOUS

## 2016-05-02 MED ORDER — HYDRALAZINE HCL 20 MG/ML IJ SOLN: 10.0000 mg | Freq: Three times a day (TID) | INTRAMUSCULAR | Status: DC | PRN

## 2016-05-02 MED ORDER — SODIUM CHLORIDE 0.9 % IV BOLUS (SEPSIS)
1000.0000 mL | Freq: Once | INTRAVENOUS | Status: AC
  Administered 2016-05-02: 1000 mL via INTRAVENOUS

## 2016-05-02 MED ORDER — ACETAMINOPHEN 650 MG RE SUPP: 650.0000 mg | Freq: Four times a day (QID) | RECTAL | Status: DC | PRN

## 2016-05-02 NOTE — Progress Notes (Signed)
Pharmacy Antibiotic Note  Benjamin Mcdowell is a 51 y.o. male admitted on 05/02/2016 with cellulitis.  Pharmacy has been consulted for vancomycin dosing.  Patient received vancomycin 1g IV once in the ED. Will give additional 1g dose for total load of 2g.  Plan: Vancomycin 1g IV every 12 hours.  Goal trough 10-15 mcg/mL.  Monitor culture data, renal function and clinical course VT at SS prn     Temp (24hrs), Avg:102.3 F (39.1 C), Min:102.3 F (39.1 C), Max:102.3 F (39.1 C)   Recent Labs Lab 05/02/16 1202 05/02/16 1225  WBC 20.2*  --   CREATININE 1.22  --   LATICACIDVEN  --  3.04*    CrCl cannot be calculated (Unknown ideal weight.).    No Known Allergies   Andrey Cota. Diona Foley, PharmD, BCPS Clinical Pharmacist [phone] 05/02/2016 2:02 PM

## 2016-05-02 NOTE — Progress Notes (Signed)
CRITICAL VALUE ALERT  Critical value received:  Lactic acid 2.7  Date of notification:  05/02/16  Time of notification:  1926  Critical value read back:Yes.    Nurse who received alert:  Dow Adolph  MD notified (1st page):  Hospitalist  Time of first page:  1927  MD notified (2nd page):  Time of second page:  Responding MD:    Time MD responded:

## 2016-05-02 NOTE — ED Notes (Signed)
Patient transported to X-ray 

## 2016-05-02 NOTE — ED Notes (Signed)
Report called  

## 2016-05-02 NOTE — ED Notes (Signed)
Abnormal CG-4 reported to Dr. Vanita Panda

## 2016-05-02 NOTE — ED Provider Notes (Signed)
[facility] DEPT Provider Note   CSN: [id number] Arrival date & time: 05/02/16  1142     History   Chief Complaint Chief Complaint  Patient presents with  . Fever    HPI Benjamin Mcdowell is a 51 y.o. male.  HPI  Patient presents with concern of pain, swelling, erythema in his right foot. Patient has a notable history of recurrent cellulitis, typically in this extremity. He notes that over the past day he has developed increasing symptoms, consistent with multiple prior similar episodes all associated with cellulitis. Most recent episode was several months ago, and had pronounced effects throughout the distal right lower extremity, requiring prolonged hospitalization. Patient now recalls only mild possible precipitant, with dry, scratchy skin on the dorsum of the foot. Since symptoms began, he has had persistent discomfort, increasing swelling, though at baseline he has notable asymmetry of the legs from recurrent infection. Patient is here with his wife assists with the history of present illness. Patient also with nausea, fever, but no chest pain, abdominal pain, vomiting, diarrhea.   Past Medical History:  Diagnosis Date  . Allergic drug rash   . Anal fissure    "lanced it w/my hemorrhoids"  . Cancer of skin of back    "don't know which kind"  . Cellulitis    recurrent cellulitis right foot-per patient-per patient in hospital for IV antibiotics 01/2012  . Cellulitis of right lower extremity hospitalized 12/21/2014  . Edema    LE, R>L since child hood  . Erythema nodosum   . Hemorrhoids   . Vasculitis of skin     Patient Active Problem List   Diagnosis Date Noted  . Sepsis ([place]) 05/02/2016  . Hyperglycemia   . CHRONIC Edema of right lower extremity 12/21/2014  . Obesity, Class II, BMI 35-39.9, isolated 12/21/2014  . High triglycerides 01/06/2013    Past Surgical History:  Procedure Laterality Date  . [place]   "had them lanced; also lanced  anal fissure"  . HERNIA REPAIR    . PENIS REVASCULARIZATION SURGERY    . UMBILICAL HERNIA REPAIR  12/24/2007   with Proceed ventral       Home Medications    Prior to Admission medications   Medication Sig Start Date End Date Taking? Authorizing Provider  cephALEXin (KEFLEX) 500 MG capsule Take 500 mg by mouth 4 (four) times daily.   Yes Historical Provider, MD  furosemide (LASIX) 20 MG tablet Take once daily in the morning. Patient taking differently: Take 20 mg by mouth daily as needed for fluid. Take once daily in the morning. 08/20/15  Yes Lucretia Kern, DO    Family History Family History  Problem Relation Age of Onset  . Prostate cancer Father   . Heart disease      GRANDFATHER    Social History Social History  Substance Use Topics  . Smoking status: Never Smoker  . Smokeless tobacco: Never Used  . Alcohol use 3.6 oz/week    6 Cans of beer per week     Allergies   Patient has no known allergies.   Review of Systems Review of Systems  Constitutional:       Per HPI, otherwise negative  HENT:       Per HPI, otherwise negative  Respiratory:       Per HPI, otherwise negative  Cardiovascular:       Per HPI, otherwise negative  Gastrointestinal: Negative for vomiting.  Endocrine:       Negative  aside from HPI  Genitourinary:       Neg aside from HPI   Musculoskeletal:       Per HPI, otherwise negative  Skin: Positive for color change.  Neurological: Negative for syncope.     Physical Exam Updated Vital Signs BP 123/75 (BP Location: Left Arm)   Pulse 84   Temp 98.9 F (37.2 C)   Resp 20   SpO2 97%   Physical Exam  Constitutional: He is oriented to person, place, and time. He appears well-developed. No distress.  HENT:  Head: Normocephalic and atraumatic.  Eyes: Conjunctivae and EOM are normal.  Cardiovascular: Normal rate and regular rhythm.   Pulmonary/Chest: Effort normal. No stridor. No respiratory distress.  Abdominal: He exhibits no  distension.  Musculoskeletal: He exhibits edema.  Patient with notable asymmetry of his lower extremities, right leg is substantially larger, with mottled, erythematous skin, and is superficial abrasions on the dorsum of the foot.   Neurological: He is alert and oriented to person, place, and time.  Skin: Skin is warm and dry.  Skin changes as above, musculoskeletal exam.  Psychiatric: He has a normal mood and affect.  Nursing note and vitals reviewed.    ED Treatments / Results  Labs (all labs ordered are listed, but only abnormal results are displayed) Labs Reviewed  COMPREHENSIVE METABOLIC PANEL - Abnormal; Notable for the following:       Result Value   Glucose, Bld 153 (*)    AST 54 (*)    ALT 105 (*)    Total Bilirubin 1.3 (*)    All other components within normal limits  CBC WITH DIFFERENTIAL/PLATELET - Abnormal; Notable for the following:    WBC 20.2 (*)    Neutro Abs 18.8 (*)    Lymphs Abs 0.4 (*)    All other components within normal limits  CBC - Abnormal; Notable for the following:    WBC 15.8 (*)    HCT 38.2 (*)    All other components within normal limits  I-STAT CG4 LACTIC ACID, ED - Abnormal; Notable for the following:    Lactic Acid, Venous 3.04 (*)    All other components within normal limits  CULTURE, BLOOD (ROUTINE X 2)  CULTURE, BLOOD (ROUTINE X 2)  CREATININE, SERUM  URINALYSIS, ROUTINE W REFLEX MICROSCOPIC  LACTIC ACID, PLASMA  LACTIC ACID, PLASMA  HIV ANTIBODY (ROUTINE TESTING)  I-STAT CG4 LACTIC ACID, ED    Radiology Dg Chest 2 View  Result Date: 05/02/2016 CLINICAL DATA:  fever as high as 103 this morning with generalized body aches, hx of cellulitis that typically presents this way, denies any redness or pain in right foot where cellulitis normally is. Pt a/ox4, fever 102.3 in triage. Abnormal CG-4 EXAM: CHEST  2 VIEW COMPARISON:  12/21/2014 FINDINGS: Normal mediastinum and cardiac silhouette. Normal pulmonary vasculature. No evidence of  effusion, infiltrate, or pneumothorax. No acute bony abnormality. IMPRESSION: No acute cardiopulmonary process. Electronically Signed   By: Suzy Bouchard M.D.   On: 05/02/2016 15:18    Procedures Procedures (including critical care time)  Medications Ordered in ED Medications  0.9 %  sodium chloride infusion (not administered)  acetaminophen (TYLENOL) tablet 650 mg (not administered)    Or  acetaminophen (TYLENOL) suppository 650 mg (not administered)  ondansetron (ZOFRAN) tablet 4 mg (not administered)    Or  ondansetron (ZOFRAN) injection 4 mg (not administered)  HYDROcodone-acetaminophen (NORCO/VICODIN) 5-325 MG per tablet 1-2 tablet (not administered)  hydrALAZINE (APRESOLINE) injection 10 mg (not  administered)  enoxaparin (LOVENOX) injection 40 mg (not administered)  vancomycin (VANCOCIN) IVPB 1000 mg/200 mL premix (not administered)  vancomycin (VANCOCIN) IVPB 1000 mg/200 mL premix (not administered)  acetaminophen (TYLENOL) tablet 1,000 mg (1,000 mg Oral Given 05/02/16 1313)  vancomycin (VANCOCIN) IVPB 1000 mg/200 mL premix (1,000 mg Intravenous Restarted 05/02/16 1432)  sodium chloride 0.9 % bolus 1,000 mL (1,000 mLs Intravenous New Bag/Given 05/02/16 1401)     Initial Impression / Assessment and Plan / ED Course  I have reviewed the triage vital signs and the nursing notes.  Pertinent labs & imaging results that were available during my care of the patient were reviewed by me and considered in my medical decision making (see chart for details).  Chart review notable for multiple evaluations for cellulitis per  On repeat exam the patient is in similar condition, is receiving fluids, will start antibiotics, given concern for cellulitis per This was notable for leukocytosis greater than 15,000, lactic acidosis.  Patient's initial fever of 102 has diminished to normal after Tylenol.    Final Clinical Impressions(s) / ED Diagnoses   Final diagnoses:  Fever   Cellulitis    Carmin Muskrat, MD 05/02/16 1606

## 2016-05-02 NOTE — ED Triage Notes (Signed)
Pt reports fever as high as 103 this morning with generalized body aches, hx of cellulitis that typically presents this way, denies any redness or pain in right foot where cellulitis normally is. Pt a/ox4, fever 102.3 in triage.

## 2016-05-02 NOTE — H&P (Signed)
[facility] History and Physical  Nik Gorrell GEX:[id number] DOB: [birth year] DOA: 05/02/2016  Referring physician:  PCP: Lucretia Kern., DO   Chief Complaint: "When my leg gets like this I need to come in."  HPI: Lonald Troiani is a 51 y.o. male  paraspinal history of right lower extremity cellulitis and right lower face swelling since childhood presents emergency room with right lower extremity erythema and swelling. Patient states this episode just started. He had Keflex at home which he took because he was instructed to take it as needed with any worsening of his right leg. Patient told 1 dose. Then immediately came to the emergency room for evaluation. Primarily due to flulike symptoms of fever and over body aches. Patient does admit to repeated trauma of his right foot scratching it and bumping against things. Patient states that the same behaviors were the cause of his last episode of right lower extremity cellulitis.  ED course: Patient given a dose of vancomycin. Hospitalist consulted for admission.   Review of Systems:  As per HPI otherwise 10 point review of systems negative.    Past Medical History:  Diagnosis Date  . Allergic drug rash   . Anal fissure    "lanced it w/my hemorrhoids"  . Cancer of skin of back    "don't know which kind"  . Cellulitis    recurrent cellulitis right foot-per patient-per patient in hospital for IV antibiotics 01/2012  . Cellulitis of right lower extremity hospitalized 12/21/2014  . Edema    LE, R>L since child hood  . Erythema nodosum   . Hemorrhoids   . Vasculitis of skin    Past Surgical History:  Procedure Laterality Date  . [place]   "had them lanced; also lanced anal fissure"  . HERNIA REPAIR    . PENIS REVASCULARIZATION SURGERY    . UMBILICAL HERNIA REPAIR  12/24/2007   with Proceed ventral   Social History:  reports that he has never smoked. He has never used smokeless tobacco. He reports that he drinks  about 3.6 oz of alcohol per week . He reports that he uses drugs, including Marijuana.  No Known Allergies  Family History  Problem Relation Age of Onset  . Prostate cancer Father   . Heart disease      GRANDFATHER     Prior to Admission medications   Medication Sig Start Date End Date Taking? Authorizing Provider  cephALEXin (KEFLEX) 500 MG capsule Take 500 mg by mouth 4 (four) times daily.   Yes Historical Provider, MD  furosemide (LASIX) 20 MG tablet Take once daily in the morning. Patient taking differently: Take 20 mg by mouth daily as needed for fluid. Take once daily in the morning. 08/20/15  Yes Lucretia Kern, DO   Physical Exam: Vitals:   05/02/16 1345 05/02/16 1400 05/02/16 1402 05/02/16 1533  BP: 119/80 113/76  123/75  Pulse: 99 93  84  Resp: (!) 25 19  20   Temp:   99.2 F (37.3 C) 98.9 F (37.2 C)  SpO2: 94% 95%  97%  Weight:    131.5 kg (290 lb)  Height:    6' (1.829 m)    Wt Readings from Last 3 Encounters:  05/02/16 131.5 kg (290 lb)  12/27/15 129.4 kg (285 lb 4.8 oz)  12/17/15 130.7 kg (288 lb 3.2 oz)    General: Obese, Appears calm and comfortable; A&Ox3 Eyes:  PERRL, EOMI, normal lids, iris ENT:  grossly normal hearing, lips &  tongue Neck:  no LAD, masses or thyromegaly Cardiovascular:  RRR, no m/r/g. R leg chronically enlarged Respiratory:  CTA bilaterally, no w/r/r. Normal respiratory effort. Abdomen:  soft, ntnd Skin:  erythema of low R leg Musculoskeletal:  grossly normal tone BUE/BLE Psychiatric:  grossly normal mood and affect, speech fluent and appropriate Neurologic:  CN 2-12 grossly intact, moves all extremities in coordinated fashion.          Labs on Admission:  Basic Metabolic Panel:  Recent Labs Lab 05/02/16 1202 05/02/16 1429  NA 135  --   K 4.1  --   CL 103  --   CO2 23  --   GLUCOSE 153*  --   BUN 10  --   CREATININE 1.22 1.09  CALCIUM 9.0  --    Liver Function Tests:  Recent Labs Lab 05/02/16 1202  AST 54*  ALT  105*  ALKPHOS 61  BILITOT 1.3*  PROT 6.8  ALBUMIN 4.1   No results for input(s): LIPASE, AMYLASE in the last 168 hours. No results for input(s): AMMONIA in the last 168 hours. CBC:  Recent Labs Lab 05/02/16 1202 05/02/16 1429  WBC 20.2* 15.8*  NEUTROABS 18.8*  --   HGB 15.2 13.0  HCT 45.0 38.2*  MCV 82.1 82.2  PLT 216 166   Cardiac Enzymes: No results for input(s): CKTOTAL, CKMB, CKMBINDEX, TROPONINI in the last 168 hours.  BNP (last 3 results) No results for input(s): BNP in the last 8760 hours.  ProBNP (last 3 results) No results for input(s): PROBNP in the last 8760 hours.   Serum creatinine: 1.09 mg/dL 05/02/16 1429 Estimated creatinine clearance: 113.8 mL/min  CBG: No results for input(s): GLUCAP in the last 168 hours.  Radiological Exams on Admission: Dg Chest 2 View  Result Date: 05/02/2016 CLINICAL DATA:  fever as high as 103 this morning with generalized body aches, hx of cellulitis that typically presents this way, denies any redness or pain in right foot where cellulitis normally is. Pt a/ox4, fever 102.3 in triage. Abnormal CG-4 EXAM: CHEST  2 VIEW COMPARISON:  12/21/2014 FINDINGS: Normal mediastinum and cardiac silhouette. Normal pulmonary vasculature. No evidence of effusion, infiltrate, or pneumothorax. No acute bony abnormality. IMPRESSION: No acute cardiopulmonary process. Electronically Signed   By: Suzy Bouchard M.D.   On: 05/02/2016 15:18    EKG: Independently reviewed. NSR, no stemi  Assessment/Plan Principal Problem:   Sepsis ([place]) Active Problems:   Elevated bilirubin   Sepsis 2/2  cellulitis Patient hemodynamically stable Given Vanc in the emergency room, will continue Urine culture pending Blood cultures 2 pending Patient given 1000 mL of fluid in the emergency room Lactic acid elevated, will trend  Elevated bili Check direct bili Recheck T bili in AM after hydration   Code Status: FULL DVT Prophylaxis: Lovenox Family  Communication: wife at bedside Disposition Plan: Pending Improvement  Status: inpt tele  Elwin Mocha, MD Family Medicine [facility] [email or web address] Password [email or web address]

## 2016-05-02 NOTE — ED Notes (Signed)
Patient returned from X RAY.  Taken to floor at this time. All belonging taken with patient/family.

## 2016-05-02 NOTE — Progress Notes (Signed)
Benjamin Mcdowell is a 51 y.o. male patient admitted from ED awake, alert - oriented  X 4 - no acute distress noted.  VSS - Blood pressure 123/75, pulse 84, temperature 98.9 F (37.2 C), resp. rate 20, height 6' (1.829 m), weight 131.5 kg (290 lb), SpO2 97 %.    IV in place, NS infusing at 125 hour occlusive dsg intact without redness.  Orientation to room, and floor completed with information packet given to patient/family.  Patient declined safety video at this time.  Admission INP armband ID verified with patient/family, and in place.   SR up x 2, fall assessment complete, with patient and family able to verbalize understanding of risk associated with falls, and verbalized understanding to call nsg before up out of bed.  Call light within reach, patient able to voice, and demonstrate understanding.  Skin, clean-dry- intact without evidence of bruising, or skin tears.   No evidence of skin break down noted on exam.     Will cont to eval and treat per MD orders.  [name], RN 05/02/2016 7:41 PM

## 2016-05-03 DIAGNOSIS — A419 Sepsis, unspecified organism: Secondary | ICD-10-CM | POA: Diagnosis not present

## 2016-05-03 LAB — CBC
HCT: 39.1 % (ref 39.0–52.0)
HEMOGLOBIN: 13.3 g/dL (ref 13.0–17.0)
MCH: 27.9 pg (ref 26.0–34.0)
MCHC: 34 g/dL (ref 30.0–36.0)
MCV: 82 fL (ref 78.0–100.0)
Platelets: 158 10*3/uL (ref 150–400)
RBC: 4.77 MIL/uL (ref 4.22–5.81)
RDW: 13.2 % (ref 11.5–15.5)
WBC: 10.1 10*3/uL (ref 4.0–10.5)

## 2016-05-03 LAB — BASIC METABOLIC PANEL
ANION GAP: 7 (ref 5–15)
BUN: 10 mg/dL (ref 6–20)
CO2: 25 mmol/L (ref 22–32)
Calcium: 8.2 mg/dL — ABNORMAL LOW (ref 8.9–10.3)
Chloride: 106 mmol/L (ref 101–111)
Creatinine, Ser: 1.17 mg/dL (ref 0.61–1.24)
GFR calc Af Amer: >60 mL/min (ref >60)
GLUCOSE: 152 mg/dL — AB (ref 65–99)
Potassium: 3.9 mmol/L (ref 3.5–5.1)
SODIUM: 138 mmol/L (ref 135–145)

## 2016-05-03 LAB — HEPATIC FUNCTION PANEL
ALBUMIN: 3.3 g/dL — AB (ref 3.5–5.0)
ALT: 74 U/L — ABNORMAL HIGH (ref 17–63)
AST: 30 U/L (ref 15–41)
Alkaline Phosphatase: 52 U/L (ref 38–126)
Bilirubin, Direct: 0.3 mg/dL (ref 0.1–0.5)
Indirect Bilirubin: 0.7 mg/dL (ref 0.3–0.9)
TOTAL PROTEIN: 5.6 g/dL — AB (ref 6.5–8.1)
Total Bilirubin: 1 mg/dL (ref 0.3–1.2)

## 2016-05-03 LAB — HIV ANTIBODY (ROUTINE TESTING W REFLEX): HIV SCREEN 4TH GENERATION: NONREACTIVE

## 2016-05-03 LAB — LACTIC ACID, PLASMA: LACTIC ACID, VENOUS: 1 mmol/L (ref 0.5–1.9)

## 2016-05-03 NOTE — Progress Notes (Signed)
PROGRESS NOTE    Benjamin Mcdowell  OZY:[id number] DOB: 1965/04/22 DOA: 05/02/2016 PCP: Lucretia Kern., DO    Brief Narrative: Benjamin Mcdowell is a 51 y.o. male past  history of right lower extremity cellulitis  since childhood presents emergency room with right lower extremity erythema and swelling.  He was admitted for treatment of cellulitis with IV vancomycin.   Assessment & Plan:   Principal Problem:   Sepsis ([place]) Active Problems:   Elevated bilirubin   Sepsis from cellulitis of the right lower extremity:  Admitted for IV antibiotics.  Lactic acid normalized, stopped the IV fluids.  bp and tachycardia improved.  Febrile to 102 last night and leukocytosis improved.       DVT prophylaxis: lovenox.  Code Status: full code.  Family Communication: none at bedside.  Disposition Plan: pending further eval.    Consultants:   None.    Procedures: venous duplex lower extremity on the right.    Antimicrobials: vancomycin.    Subjective: Swelling and redness hasn't improved.   Objective: Vitals:   05/02/16 1555 05/02/16 2010 05/02/16 2141 05/03/16 0526  BP:   139/70 136/75  Pulse:   96 77  Resp:   18 18  Temp:   99.6 F (37.6 C) 98.6 F (37 C)  TempSrc:  Oral    SpO2:   94% 98%  Weight: 131.5 kg (290 lb)     Height: 6' (1.829 m)       Intake/Output Summary (Last 24 hours) at 05/03/16 1316 Last data filed at 05/03/16 0715  Gross per 24 hour  Intake           2227.5 ml  Output              700 ml  Net           1527.5 ml   Filed Weights   05/02/16 1533 05/02/16 1555  Weight: 131.5 kg (290 lb) 131.5 kg (290 lb)    Examination:  General exam: Appears calm and comfortable  Respiratory system: Clear to auscultation. Respiratory effort normal. Cardiovascular system: S1 & S2 heard, RRR. No JVD, murmurs, rubs, gallops or clicks. No pedal edema. Gastrointestinal system: Abdomen is nondistended, soft and nontender. No organomegaly or masses felt. Normal bowel  sounds heard. Central nervous system: Alert and oriented. No focal neurological deficits. Extremities: left lower extremity swollen and tender and erythematous.  Psychiatry: Judgement and insight appear normal. Mood & affect appropriate.     Data Reviewed: I have personally reviewed following labs and imaging studies  CBC:  Recent Labs Lab 05/02/16 1202 05/02/16 1429 05/03/16 0101  WBC 20.2* 15.8* 10.1  NEUTROABS 18.8*  --   --   HGB 15.2 13.0 13.3  HCT 45.0 38.2* 39.1  MCV 82.1 82.2 82.0  PLT 216 166 048   Basic Metabolic Panel:  Recent Labs Lab 05/02/16 1202 05/02/16 1429 05/03/16 0101  NA 135  --  138  K 4.1  --  3.9  CL 103  --  106  CO2 23  --  25  GLUCOSE 153*  --  152*  BUN 10  --  10  CREATININE 1.22 1.09 1.17  CALCIUM 9.0  --  8.2*   GFR: Estimated Creatinine Clearance: 106 mL/min (by C-G formula based on SCr of 1.17 mg/dL). Liver Function Tests:  Recent Labs Lab 05/02/16 1202 05/03/16 0101  AST 54* 30  ALT 105* 74*  ALKPHOS 61 52  BILITOT 1.3* 1.0  PROT 6.8 5.6*  ALBUMIN 4.1 3.3*   No results for input(s): LIPASE, AMYLASE in the last 168 hours. No results for input(s): AMMONIA in the last 168 hours. Coagulation Profile: No results for input(s): INR, PROTIME in the last 168 hours. Cardiac Enzymes: No results for input(s): CKTOTAL, CKMB, CKMBINDEX, TROPONINI in the last 168 hours. BNP (last 3 results) No results for input(s): PROBNP in the last 8760 hours. HbA1C: No results for input(s): HGBA1C in the last 72 hours. CBG: No results for input(s): GLUCAP in the last 168 hours. Lipid Profile: No results for input(s): CHOL, HDL, LDLCALC, TRIG, CHOLHDL, LDLDIRECT in the last 72 hours. Thyroid Function Tests: No results for input(s): TSH, T4TOTAL, FREET4, T3FREE, THYROIDAB in the last 72 hours. Anemia Panel: No results for input(s): VITAMINB12, FOLATE, FERRITIN, TIBC, IRON, RETICCTPCT in the last 72 hours. Sepsis Labs:  Recent Labs Lab  05/02/16 1605 05/02/16 1839 05/02/16 2228 05/03/16 0101  LATICACIDVEN 1.7 2.7* 1.3 1.0    No results found for this or any previous visit (from the past 240 hour(s)).       Radiology Studies: Dg Chest 2 View  Result Date: 05/02/2016 CLINICAL DATA:  fever as high as 103 this morning with generalized body aches, hx of cellulitis that typically presents this way, denies any redness or pain in right foot where cellulitis normally is. Pt a/ox4, fever 102.3 in triage. Abnormal CG-4 EXAM: CHEST  2 VIEW COMPARISON:  12/21/2014 FINDINGS: Normal mediastinum and cardiac silhouette. Normal pulmonary vasculature. No evidence of effusion, infiltrate, or pneumothorax. No acute bony abnormality. IMPRESSION: No acute cardiopulmonary process. Electronically Signed   By: Suzy Bouchard M.D.   On: 05/02/2016 15:18        Scheduled Meds: . enoxaparin (LOVENOX) injection  40 mg Subcutaneous Q24H   Continuous Infusions: . vancomycin Stopped (05/02/16 1647)  . vancomycin Stopped (05/03/16 0215)     LOS: 0 days    Time spent: 30 minutes.     Hosie Poisson, MD [facility] Pager [phone]   If 7PM-7AM, please contact night-coverage [email or web address] Password [email or web address] 05/03/2016, 1:16 PM

## 2016-05-04 ENCOUNTER — Observation Stay (HOSPITAL_BASED_OUTPATIENT_CLINIC_OR_DEPARTMENT_OTHER): Payer: 59

## 2016-05-04 DIAGNOSIS — M7989 Other specified soft tissue disorders: Secondary | ICD-10-CM | POA: Diagnosis not present

## 2016-05-04 DIAGNOSIS — M79609 Pain in unspecified limb: Secondary | ICD-10-CM

## 2016-05-04 DIAGNOSIS — A419 Sepsis, unspecified organism: Secondary | ICD-10-CM | POA: Diagnosis not present

## 2016-05-04 MED ORDER — HYDROCODONE-ACETAMINOPHEN 5-325 MG PO TABS: 1.0000 | ORAL_TABLET | ORAL | 0 refills | Status: DC | PRN

## 2016-05-04 MED ORDER — FUROSEMIDE 20 MG PO TABS: ORAL_TABLET | ORAL | 3 refills | Status: DC

## 2016-05-04 MED ORDER — CLINDAMYCIN HCL 300 MG PO CAPS: 300.0000 mg | ORAL_CAPSULE | Freq: Three times a day (TID) | ORAL | 0 refills | Status: DC

## 2016-05-04 NOTE — Progress Notes (Signed)
VASCULAR LAB PRELIMINARY  PRELIMINARY  PRELIMINARY  PRELIMINARY  Right lower extremity venous duplex completed.    Preliminary report:  There is no DVT or SVT noted in the right lower extremity.  Enlarged inguinal lymph nodes noted.   Jamarria Real, RVT 05/04/2016, 11:22 AM

## 2016-05-04 NOTE — Progress Notes (Signed)
Benjamin Mcdowell to be D/C'd home per MD order.  Discussed with the patient and all questions fully answered.  VSS, Skin clean, dry and intact without evidence of skin break down, no evidence of skin tears noted. IV catheter discontinued intact. Site without signs and symptoms of complications. Dressing and pressure applied.  An After Visit Summary was printed and given to the patient. Patient had no printed prescription.  D/c education completed with patient/family including follow up instructions, medication list, d/c activities limitations if indicated, with other d/c instructions as indicated by MD - patient able to verbalize understanding, all questions fully answered.   Patient instructed to return to ED, call 911, or call MD for any changes in condition.   Patient escorted via [place], and D/C home via private auto.  [name] 05/04/2016 2:27 PM

## 2016-05-04 NOTE — Discharge Summary (Signed)
Physician Discharge Summary  Benjamin Mcdowell LYY:[id number] DOB: [birth year] DOA: 05/02/2016  PCP: Benjamin Mcdowell., DO  Admit date: 05/02/2016 Discharge date: 05/04/2016  Admitted From: Home Disposition:  Home  Recommendations for Outpatient Follow-up:  1. Follow up with PCP in 1-2 weeks 2. Please obtain BMP/CBC in one week Please follow up with vein and vascular in one month.    Discharge Condition:stable. CODE STATUS: FULL CODE Diet recommendation: Heart Healthy   Brief/Interim Summary: Benjamin Mcdowell a 51 y.o.malepast  history of right lower extremity cellulitis  since childhood presents emergency room with right lower extremity erythema and swelling.  He was admitted for treatment of cellulitis with IV vancomycin.   Discharge Diagnoses:  Principal Problem:   Sepsis ([place]) Active Problems:   Elevated bilirubin  Sepsis from Cellulitis of the right lower extremity:  Admitted for IV vancomycin.  Lactic acid normalized, bp and tachycardia improved.  Leukocytosis improved.  Right lower extremity vascular duplex negative for DVT.  Redness and swelling improved. Discharged him on oral clindamycin to complete the course.   Discharge Instructions  Discharge Instructions    Diet general    Complete by:  As directed    Discharge instructions    Complete by:  As directed    Follow up with PCP in one week.  Please follow up with Vein and Vascular in 2 weeks.     Allergies as of 05/04/2016   No Known Allergies     Medication List    STOP taking these medications   cephALEXin 500 MG capsule Commonly known as:  KEFLEX     TAKE these medications   clindamycin 300 MG capsule Commonly known as:  CLEOCIN Take 1 capsule (300 mg total) by mouth 3 (three) times daily.   furosemide 20 MG tablet Commonly known as:  LASIX Take once daily in the morning. What changed:  how much to take  how to take this  when to take this  reasons to take this  additional instructions      Follow-up Information    [name], Benjamin Soho R., DO. Schedule an appointment as soon as possible for a visit in 1 week(s).   Specialty:  Family Medicine Contact information: [place] [address] [phone]        [facility]. Schedule an appointment as soon as possible for a visit in 1 month(s).   Contact information: [address] [place] [phone]         No Known Allergies  Consultations:  none   Procedures/Studies: Dg Chest 2 View  Result Date: 05/02/2016 CLINICAL DATA:  fever as high as 103 this morning with generalized body aches, hx of cellulitis that typically presents this way, denies any redness or pain in right foot where cellulitis normally is. Pt a/ox4, fever 102.3 in triage. Abnormal CG-4 EXAM: CHEST  2 VIEW COMPARISON:  12/21/2014 FINDINGS: Normal mediastinum and cardiac silhouette. Normal pulmonary vasculature. No evidence of effusion, infiltrate, or pneumothorax. No acute bony abnormality. IMPRESSION: No acute cardiopulmonary process. Electronically Signed   By: Suzy Bouchard M.D.   On: 05/02/2016 15:18       Subjective: No new complaints  Discharge Exam: Vitals:   05/03/16 2114 05/04/16 0505  BP: (!) 144/89 (!) 146/82  Pulse: 93 65  Resp: 18 18  Temp: 99.6 F (37.6 C) 98 F (36.7 C)   Vitals:   05/03/16 0526 05/03/16 1558 05/03/16 2114 05/04/16 0505  BP: 136/75 (!) 159/89 Benjamin Mcdowell)  144/89 (!) 146/82  Pulse: 77 81 93 65  Resp: 18 18 18 18   Temp: 98.6 F (37 C) 99.3 F (37.4 C) 99.6 F (37.6 C) 98 F (36.7 C)  TempSrc:  Oral Oral Oral  SpO2: 98% 96% 96% 96%  Weight:      Height:        General: Pt is alert, awake, not in acute distress Cardiovascular: RRR, S1/S2 +, no rubs, no gallops Respiratory: CTA bilaterally, no wheezing, no rhonchi Abdominal: Soft, NT, ND, bowel sounds + Extremities: right lymph edema, no cyanosis    The results of significant diagnostics from  this hospitalization (including imaging, microbiology, ancillary and laboratory) are listed below for reference.     Microbiology: Recent Results (from the past 240 hour(s))  Culture, blood (Routine X 2) w Reflex to ID Panel     Status: None (Preliminary result)   Collection Time: 05/02/16  2:23 PM  Result Value Ref Range Status   Specimen Description BLOOD RIGHT HAND  Final   Special Requests   Final    BOTTLES DRAWN AEROBIC AND ANAEROBIC Blood Culture adequate volume   Culture NO GROWTH 2 DAYS  Final   Report Status PENDING  Incomplete  Culture, blood (Routine X 2) w Reflex to ID Panel     Status: None (Preliminary result)   Collection Time: 05/02/16  2:31 PM  Result Value Ref Range Status   Specimen Description BLOOD RIGHT ANTECUBITAL  Final   Special Requests   Final    BOTTLES DRAWN AEROBIC AND ANAEROBIC Blood Culture adequate volume   Culture NO GROWTH 2 DAYS  Final   Report Status PENDING  Incomplete     Labs: BNP (last 3 results) No results for input(s): BNP in the last 8760 hours. Basic Metabolic Panel:  Recent Labs Lab 05/02/16 1202 05/02/16 1429 05/03/16 0101  NA 135  --  138  K 4.1  --  3.9  CL 103  --  106  CO2 23  --  25  GLUCOSE 153*  --  152*  BUN 10  --  10  CREATININE 1.22 1.09 1.17  CALCIUM 9.0  --  8.2*   Liver Function Tests:  Recent Labs Lab 05/02/16 1202 05/03/16 0101  AST 54* 30  ALT 105* 74*  ALKPHOS 61 52  BILITOT 1.3* 1.0  PROT 6.8 5.6*  ALBUMIN 4.1 3.3*   No results for input(s): LIPASE, AMYLASE in the last 168 hours. No results for input(s): AMMONIA in the last 168 hours. CBC:  Recent Labs Lab 05/02/16 1202 05/02/16 1429 05/03/16 0101  WBC 20.2* 15.8* 10.1  NEUTROABS 18.8*  --   --   HGB 15.2 13.0 13.3  HCT 45.0 38.2* 39.1  MCV 82.1 82.2 82.0  PLT 216 166 158   Cardiac Enzymes: No results for input(s): CKTOTAL, CKMB, CKMBINDEX, TROPONINI in the last 168 hours. BNP: Invalid input(s): POCBNP CBG: No results for  input(s): GLUCAP in the last 168 hours. D-Dimer No results for input(s): DDIMER in the last 72 hours. Hgb A1c No results for input(s): HGBA1C in the last 72 hours. Lipid Profile No results for input(s): CHOL, HDL, LDLCALC, TRIG, CHOLHDL, LDLDIRECT in the last 72 hours. Thyroid function studies No results for input(s): TSH, T4TOTAL, T3FREE, THYROIDAB in the last 72 hours.  Invalid input(s): FREET3 Anemia work up No results for input(s): VITAMINB12, FOLATE, FERRITIN, TIBC, IRON, RETICCTPCT in the last 72 hours. Urinalysis    Component Value Date/Time   COLORURINE YELLOW 05/02/2016  [place] 05/02/2016 1743   LABSPEC 1.009 05/02/2016 1743   PHURINE 6.0 05/02/2016 1743   GLUCOSEU NEGATIVE 05/02/2016 1743   HGBUR NEGATIVE 05/02/2016 1743   BILIRUBINUR NEGATIVE 05/02/2016 1743   KETONESUR NEGATIVE 05/02/2016 1743   PROTEINUR NEGATIVE 05/02/2016 1743   UROBILINOGEN 0.2 11/12/2010 0903   NITRITE NEGATIVE 05/02/2016 1743   LEUKOCYTESUR NEGATIVE 05/02/2016 1743   Sepsis Labs Invalid input(s): PROCALCITONIN,  WBC,  LACTICIDVEN Microbiology Recent Results (from the past 240 hour(s))  Culture, blood (Routine X 2) w Reflex to ID Panel     Status: None (Preliminary result)   Collection Time: 05/02/16  2:23 PM  Result Value Ref Range Status   Specimen Description BLOOD RIGHT HAND  Final   Special Requests   Final    BOTTLES DRAWN AEROBIC AND ANAEROBIC Blood Culture adequate volume   Culture NO GROWTH 2 DAYS  Final   Report Status PENDING  Incomplete  Culture, blood (Routine X 2) w Reflex to ID Panel     Status: None (Preliminary result)   Collection Time: 05/02/16  2:31 PM  Result Value Ref Range Status   Specimen Description BLOOD RIGHT ANTECUBITAL  Final   Special Requests   Final    BOTTLES DRAWN AEROBIC AND ANAEROBIC Blood Culture adequate volume   Culture NO GROWTH 2 DAYS  Final   Report Status PENDING  Incomplete     Time coordinating discharge: Over 30  minutes  SIGNED:   Hosie Poisson, MD  [facility] 05/04/2016, 1:40 PM Pager   If 7PM-7AM, please contact night-coverage [email or web address] Password [email or web address]

## 2016-05-05 ENCOUNTER — Telehealth: Payer: Self-pay

## 2016-05-05 NOTE — Telephone Encounter (Signed)
LMTCB

## 2016-05-06 NOTE — Telephone Encounter (Signed)
LMTCB

## 2016-05-07 LAB — CULTURE, BLOOD (ROUTINE X 2)
CULTURE: NO GROWTH
Culture: NO GROWTH
Special Requests: ADEQUATE
Special Requests: ADEQUATE

## 2016-05-07 NOTE — Telephone Encounter (Signed)
LMTCB

## 2016-05-08 NOTE — Telephone Encounter (Signed)
LMTCB  Dr. Maudie Mercury - Have attempted to contact patient several times with no response. Would you like me to continue to try and complete TCM call? Pt does have f/u scheduled 6/18. Thanks!

## 2016-05-20 ENCOUNTER — Encounter: Payer: Self-pay | Admitting: Vascular Surgery

## 2016-05-28 ENCOUNTER — Ambulatory Visit (INDEPENDENT_AMBULATORY_CARE_PROVIDER_SITE_OTHER): Payer: 59 | Admitting: Vascular Surgery

## 2016-05-28 ENCOUNTER — Encounter: Payer: Self-pay | Admitting: Vascular Surgery

## 2016-05-28 VITALS — BP 135/87 | HR 74 | Temp 98.3°F | Resp 18 | Ht 72.0 in | Wt 286.0 lb

## 2016-05-28 DIAGNOSIS — I872 Venous insufficiency (chronic) (peripheral): Secondary | ICD-10-CM | POA: Diagnosis not present

## 2016-05-28 NOTE — Progress Notes (Signed)
Patient name: Benjamin Mcdowell MRN: [id number] DOB: Aug 08, 1965 Sex: male  REASON FOR VISIT:    Right lower extremity swelling.  HPI:   Benjamin Mcdowell is a 51 y.o. male who I last saw on 10/18/2014 with right lower extremity edema and cellulitis. A venous duplex scan at that time showed no evidence of DVT in the right lower extremity. There was reflux in the common femoral vein and popliteal vein in the deep system. There was also reflux at the saphenofemoral junction. I felt that the patient most likely had chronic venous insufficiency and lymphedema. We discussed the importance of intermittent leg elevation and proper positioning for this. I wrote him a prescription for knee-high compression stockings with a gradient of 15-20 mmHg. I encouraged him to exercise as much as possible and to avoid prolonged sitting and standing. He was to follow up as needed.  He has continued to have some swelling in the right lower extremity. He also has intermittent episodes of cellulitis. He has been wearing compression stockings but has not been elevating his leg much or doing it correctly. There is no real pain associated with the swelling. He does spend most of his time on his feet at work.  Past Medical History:  Diagnosis Date  . Allergic drug rash   . Anal fissure    "lanced it w/my hemorrhoids"  . Cancer of skin of back    "don't know which kind"  . Cellulitis    recurrent cellulitis right foot-per patient-per patient in hospital for IV antibiotics 01/2012  . Cellulitis of right lower extremity hospitalized 12/21/2014  . Edema    LE, R>L since child hood  . Erythema nodosum   . Hemorrhoids   . Vasculitis of skin     Family History  Problem Relation Age of Onset  . Prostate cancer Father   . Heart disease Unknown        GRANDFATHER    SOCIAL HISTORY: Social History  Substance Use Topics  . Smoking status: Never Smoker  . Smokeless tobacco: Never Used  . Alcohol use 3.6 oz/week    6 Cans of  beer per week    No Known Allergies  Current Outpatient Prescriptions  Medication Sig Dispense Refill  . furosemide (LASIX) 20 MG tablet Take once daily in the morning. 90 tablet 3  . clindamycin (CLEOCIN) 300 MG capsule Take 1 capsule (300 mg total) by mouth 3 (three) times daily. (Patient not taking: Reported on 05/28/2016) 21 capsule 0   No current facility-administered medications for this visit.     REVIEW OF SYSTEMS:  [X]  denotes positive finding, [ ]  denotes negative finding Cardiac  Comments:  Chest pain or chest pressure:    Shortness of breath upon exertion:    Short of breath when lying flat:    Irregular heart rhythm:        Vascular    Pain in calf, thigh, or hip brought on by ambulation:    Pain in feet at night that wakes you up from your sleep:     Blood clot in your veins:    Leg swelling:  X Right leg      Pulmonary    Oxygen at home:    Productive cough:     Wheezing:         Neurologic    Sudden weakness in arms or legs:     Sudden numbness in arms or legs:     Sudden onset of difficulty speaking or  slurred speech:    Temporary loss of vision in one eye:     Problems with dizziness:         Gastrointestinal    Blood in stool:     Vomited blood:         Genitourinary    Burning when urinating:     Blood in urine:        Psychiatric    Major depression:         Hematologic    Bleeding problems:    Problems with blood clotting too easily:        Skin    Rashes or ulcers:        Constitutional    Fever or chills:     PHYSICAL EXAM:   Vitals:   05/28/16 0835 05/28/16 0837  BP: (!) 151/88 135/87  Pulse: 74   Resp: 18   Temp: 98.3 F (36.8 C)   TempSrc: Oral   SpO2: 95%   Weight: 286 lb (129.7 kg)   Height: 6' (1.829 m)     GENERAL: The patient is a well-nourished male, in no acute distress. The vital signs are documented above. CARDIAC: There is a regular rate and rhythm.  VASCULAR: I do not detect carotid bruits. He has  palpable femoral and pedal pulses. He has right lower extremity swelling up to the knee. He has swelling on the dorsum of the foot. The distribution of the swelling suggest lymphedema. PULMONARY: There is good air exchange bilaterally without wheezing or rales. ABDOMEN: Soft and non-tender with normal pitched bowel sounds.  MUSCULOSKELETAL: There are no major deformities or cyanosis. NEUROLOGIC: No focal weakness or paresthesias are detected. SKIN: There are no ulcers or rashes noted. PSYCHIATRIC: The patient has a normal affect.  DATA:    VENOUS DUPLEX: I have reviewed the venous duplex scan that was done on 05/04/2016. This showed no evidence of DVT or superficial thrombosis involving the right lower extremity.  MEDICAL ISSUES:   LYMPHEDEMA AND CHRONIC VENOUS INSUFFICIENCY: I think that this patient has both lymphedema and chronic venous insufficiency. We have again discussed the importance of intermittent leg elevation in the proper positioning for this. He has been wearing his compression stockings. I have encouraged him to consider water aerobics. He should avoid prolonged sitting and standing and exercise as much as possible. He would like to repeat his formal reflux study of the deep and superficial veins on the right leg as it has been about a year and a half.  I think that this is reasonable. I will see him back after that study. The only other consideration would be a CT scan of the abdomen and pelvis which I think would have a very low yield. His swelling appears to be limited to the lower leg on the right and so I think it is very unlikely that he has any proximal obstructing mass.  Deitra Mayo [facility] [phone]

## 2016-05-28 NOTE — Addendum Note (Signed)
Addended by: Lianne Cure A on: 05/28/2016 04:07 PM   Modules accepted: Orders

## 2016-06-25 NOTE — Progress Notes (Deleted)
  HPI:  Follow up: Due for labs: CMP, hgba1c, lipids and colon ca screening  Obesity/Hypertriglyceridemia/Hyperglycemia: -alcohol?   Chronic LE edema, lymphedema R leg, hx recurrent cellulitis: -meds: lasix 20mg  daily -sees Dr. Scot Dock for management  ROS: See pertinent positives and negatives per HPI.  Past Medical History:  Diagnosis Date  . Allergic drug rash   . Anal fissure    "lanced it w/my hemorrhoids"  . Cancer of skin of back    "don't know which kind"  . Cellulitis    recurrent cellulitis right foot-per patient-per patient in hospital for IV antibiotics 01/2012  . Cellulitis of right lower extremity hospitalized 12/21/2014  . Edema    LE, R>L since child hood  . Erythema nodosum   . Hemorrhoids   . Vasculitis of skin     Past Surgical History:  Procedure Laterality Date  . [place]   "had them lanced; also lanced anal fissure"  . HERNIA REPAIR    . PENIS REVASCULARIZATION SURGERY    . UMBILICAL HERNIA REPAIR  12/24/2007   with Proceed ventral    Family History  Problem Relation Age of Onset  . Prostate cancer Father   . Heart disease Unknown        GRANDFATHER    Social History   Social History  . Marital status: Married    Spouse name: N/A  . Number of children: N/A  . Years of education: N/A   Social History Main Topics  . Smoking status: Never Smoker  . Smokeless tobacco: Never Used  . Alcohol use 3.6 oz/week    6 Cans of beer per week  . Drug use: Yes    Types: Marijuana     Comment: 12/21/2014 "couple times q 3-4 months"  . Sexual activity: Yes   Other Topics Concern  . Not on file   Social History Narrative   Work or School: Insurance account manager Situation: lives with wife and 48 yo twins in 2016      Spiritual Beliefs: non practicing Christian      Lifestyle: started exercise and diet changes in summer 2016           Current Outpatient Prescriptions:  .  clindamycin (CLEOCIN) 300 MG capsule, Take  1 capsule (300 mg total) by mouth 3 (three) times daily. (Patient not taking: Reported on 05/28/2016), Disp: 21 capsule, Rfl: 0 .  furosemide (LASIX) 20 MG tablet, Take once daily in the morning., Disp: 90 tablet, Rfl: 3  EXAM:  There were no vitals filed for this visit.  There is no height or weight on file to calculate BMI.  GENERAL: vitals reviewed and listed above, alert, oriented, appears well hydrated and in no acute distress  HEENT: atraumatic, conjunttiva clear, no obvious abnormalities on inspection of external nose and ears  NECK: no obvious masses on inspection  LUNGS: clear to auscultation bilaterally, no wheezes, rales or rhonchi, good air movement  CV: HRRR, no peripheral edema  MS: moves all extremities without noticeable abnormality  PSYCH: pleasant and cooperative, no obvious depression or anxiety  ASSESSMENT AND PLAN:  Discussed the following assessment and plan:  No diagnosis found.  -Patient advised to return or notify a doctor immediately if symptoms worsen or persist or new concerns arise.  There are no Patient Instructions on file for this visit.  Colin Benton R., DO

## 2016-06-26 ENCOUNTER — Ambulatory Visit: Payer: Self-pay | Admitting: Family Medicine

## 2016-07-01 ENCOUNTER — Encounter (HOSPITAL_COMMUNITY): Payer: 59

## 2016-07-02 ENCOUNTER — Ambulatory Visit: Payer: 59 | Admitting: Vascular Surgery

## 2016-07-16 ENCOUNTER — Encounter: Payer: Self-pay | Admitting: Vascular Surgery

## 2016-07-27 NOTE — Progress Notes (Signed)
HPI:  Benjamin Mcdowell is a pleasant 51 y.o. here for follow up. Chronic medical problems summarized below were reviewed for changes. Doing well. No complaints. Doing elevation and compression. Not exercising. Trying to eat less sugar but admits diet is not great. Considering wt watchers.  Denies CP, SOB, DOE, treatment intolerance or new symptoms. Due for colon ca screening (has cologuard - has not completed), labs (BMP, lipids, hgba1c)  Obesity, Hyperglycemia, Hypertriglyceridemia:  Chronic LE edema, hx recurrent cellulitis, venous insuff, lymphedema: -hx recurrent hospitalizations for sepsis 2ndary to cellulitis of the R leg - most recently 04/2016 -seeing Dr. Scot Dock - Vasc Surg - elevation, compression, water aerobics, exercise advised -meds: lasix  ROS: See pertinent positives and negatives per HPI.  Past Medical History:  Diagnosis Date  . Allergic drug rash   . Anal fissure    "lanced it w/my hemorrhoids"  . Cancer of skin of back    "don't know which kind"  . Cellulitis    recurrent cellulitis right foot-per patient-per patient in hospital for IV antibiotics 01/2012  . Cellulitis of right lower extremity hospitalized 12/21/2014  . Edema    LE, R>L since child hood  . Erythema nodosum   . Hemorrhoids   . Vasculitis of skin     Past Surgical History:  Procedure Laterality Date  . [place]   "had them lanced; also lanced anal fissure"  . HERNIA REPAIR    . PENIS REVASCULARIZATION SURGERY    . UMBILICAL HERNIA REPAIR  12/24/2007   with Proceed ventral    Family History  Problem Relation Age of Onset  . Prostate cancer Father   . Heart disease Unknown        GRANDFATHER    Social History   Social History  . Marital status: Married    Spouse name: N/A  . Number of children: N/A  . Years of education: N/A   Social History Main Topics  . Smoking status: Never Smoker  . Smokeless tobacco: Never Used  . Alcohol use 3.6 oz/week    6 Cans of beer  per week  . Drug use: Yes    Types: Marijuana     Comment: 12/21/2014 "couple times q 3-4 months"  . Sexual activity: Yes   Other Topics Concern  . 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  clindamycin (CLEOCIN) 300 MG capsule, Take 1 capsule (300 mg total) by mouth 3 (three) times daily., Disp: 21 capsule, Rfl: 0 .  furosemide (LASIX) 20 MG tablet, Take once daily in the morning., Disp: 90 tablet, Rfl: 3  EXAM:  Vitals:   07/28/16 0833  BP: 120/80  Pulse: 74  Temp: 98.5 F (36.9 C)    Body mass index is 39.44 kg/m.  GENERAL: vitals reviewed and listed above, alert, oriented, appears well hydrated and in no acute distress  HEENT: atraumatic, conjunttiva clear, no obvious abnormalities on inspection of external nose and ears  NECK: no obvious masses on inspection  LUNGS: clear to auscultation bilaterally, no wheezes, rales or rhonchi, good air movement  CV: HRRR  MS: moves all extremities without noticeable abnormality  PSYCH: pleasant  and cooperative, no obvious depression or anxiety  ASSESSMENT AND PLAN:  Discussed the following assessment and plan:  Obesity, Class II, BMI 35-39.9, isolated  Essential hypertension - Plan: Basic metabolic panel, CBC  High triglycerides - Plan: Lipid panel  Hyperglycemia - Plan: Hemoglobin A1c  -lifestyle recs discussed at length, summarized below -labs -continue current meds -advised to complete the cologuard test asap -Patient advised to return or notify a doctor immediately if symptoms worsen or persist or new concerns arise.  Patient Instructions  BEFORE YOU LEAVE: -labs -follow up: 4 months  Complete the colon cancer screening this week.  We have ordered labs  or studies at this visit. It can take up to 1-2 weeks for results and processing. IF results require follow up or explanation, we will call you with instructions. Clinically stable results will be released to your [facility]. If you have not heard from [place] or cannot find your results in [facility] in 2 weeks please contact our office at [phone].  If you are not yet signed up for [facility], please consider signing up.  Continue elevation and compression.  We recommend the following healthy lifestyle for LIFE: 1) Small portions. Regular healthy small meals.  Tip: eat off of a salad plate instead of a dinner plate.    2) Eat a healthy clean diet.   TRY TO EAT: -at least 5-7 servings of low sugar vegetables per day (not corn, potatoes or bananas.) -berries are the best choice if you wish to eat fruit.   -lean meets (fish, chicken or [place] breasts) -vegan proteins for some meals - beans or tofu, whole grains, nuts and seeds -Replace bad fats with good fats - good fats include: fish, nuts and seeds, canola oil, olive oil -small amounts of low fat or non fat dairy -small amounts of100 % whole grains - check the lables  AVOID: -SUGAR, sweets, anything with added sugar, corn syrup or sweeteners -if you must have a sweetener, small amounts of stevia may be best -sweetened beverages -simple starches (rice, bread, potatoes, pasta, chips, etc - small amounts of 100% whole grains are ok) -red meat, pork, butter -fried foods, fast food, processed food, excessive dairy, eggs and coconut.  3)Get at least 150 minutes of sweaty aerobic exercise per week.  4)Reduce stress - consider counseling, meditation and relaxation to balance other aspects of your life.          Colin Benton R., DO

## 2016-07-28 ENCOUNTER — Ambulatory Visit (INDEPENDENT_AMBULATORY_CARE_PROVIDER_SITE_OTHER): Payer: 59 | Admitting: Family Medicine

## 2016-07-28 ENCOUNTER — Encounter: Payer: Self-pay | Admitting: Family Medicine

## 2016-07-28 VITALS — BP 120/80 | HR 74 | Temp 98.5°F | Ht 72.0 in | Wt 290.8 lb

## 2016-07-28 DIAGNOSIS — E781 Pure hyperglyceridemia: Secondary | ICD-10-CM

## 2016-07-28 DIAGNOSIS — R739 Hyperglycemia, unspecified: Secondary | ICD-10-CM | POA: Diagnosis not present

## 2016-07-28 DIAGNOSIS — E669 Obesity, unspecified: Secondary | ICD-10-CM

## 2016-07-28 DIAGNOSIS — I10 Essential (primary) hypertension: Secondary | ICD-10-CM

## 2016-07-28 LAB — HEMOGLOBIN A1C: HEMOGLOBIN A1C: 5.7 % (ref 4.6–6.5)

## 2016-07-28 LAB — CBC
HCT: 45.9 % (ref 39.0–52.0)
HEMOGLOBIN: 15.5 g/dL (ref 13.0–17.0)
MCHC: 33.8 g/dL (ref 30.0–36.0)
MCV: 84.7 fl (ref 78.0–100.0)
Platelets: 223 10*3/uL (ref 150.0–400.0)
RBC: 5.42 Mil/uL (ref 4.22–5.81)
RDW: 14.4 % (ref 11.5–15.5)
WBC: 6.6 10*3/uL (ref 4.0–10.5)

## 2016-07-28 LAB — BASIC METABOLIC PANEL
BUN: 15 mg/dL (ref 6–23)
CHLORIDE: 104 meq/L (ref 96–112)
CO2: 32 mEq/L (ref 19–32)
Calcium: 9.3 mg/dL (ref 8.4–10.5)
Creatinine, Ser: 1.04 mg/dL (ref 0.40–1.50)
GFR: 80.04 mL/min (ref >60.00)
Glucose, Bld: 129 mg/dL — ABNORMAL HIGH (ref 70–99)
POTASSIUM: 4.4 meq/L (ref 3.5–5.1)
SODIUM: 141 meq/L (ref 135–145)

## 2016-07-28 LAB — LIPID PANEL
Cholesterol: 172 mg/dL (ref 0–200)
HDL: 35.4 mg/dL — AB (ref >39.00)
LDL Cholesterol: 107 mg/dL — ABNORMAL HIGH (ref 0–99)
NONHDL: 136.41
Total CHOL/HDL Ratio: 5
Triglycerides: 147 mg/dL (ref 0.0–149.0)
VLDL: 29.4 mg/dL (ref 0.0–40.0)

## 2016-07-28 NOTE — Patient Instructions (Addendum)
BEFORE YOU LEAVE: -labs -follow up: 4 months  Complete the colon cancer screening this week.  We have ordered labs or studies at this visit. It can take up to 1-2 weeks for results and processing. IF results require follow up or explanation, we will call you with instructions. Clinically stable results will be released to your [facility]. If you have not heard from [place] or cannot find your results in [facility] in 2 weeks please contact our office at [phone].  If you are not yet signed up for [facility], please consider signing up.  Continue elevation and compression.  We recommend the following healthy lifestyle for LIFE: 1) Small portions. Regular healthy small meals.  Tip: eat off of a salad plate instead of a dinner plate.    2) Eat a healthy clean diet.   TRY TO EAT: -at least 5-7 servings of low sugar vegetables per day (not corn, potatoes or bananas.) -berries are the best choice if you wish to eat fruit.   -lean meets (fish, chicken or [place] breasts) -vegan proteins for some meals - beans or tofu, whole grains, nuts and seeds -Replace bad fats with good fats - good fats include: fish, nuts and seeds, canola oil, olive oil -small amounts of low fat or non fat dairy -small amounts of100 % whole grains - check the lables  AVOID: -SUGAR, sweets, anything with added sugar, corn syrup or sweeteners -if you must have a sweetener, small amounts of stevia may be best -sweetened beverages -simple starches (rice, bread, potatoes, pasta, chips, etc - small amounts of 100% whole grains are ok) -red meat, pork, butter -fried foods, fast food, processed food, excessive dairy, eggs and coconut.  3)Get at least 150 minutes of sweaty aerobic exercise per week.  4)Reduce stress - consider counseling, meditation and relaxation to balance other aspects of your life.

## 2016-07-30 ENCOUNTER — Encounter (HOSPITAL_COMMUNITY): Payer: 59

## 2016-07-30 ENCOUNTER — Ambulatory Visit: Payer: 59 | Admitting: Vascular Surgery

## 2016-10-17 ENCOUNTER — Ambulatory Visit (INDEPENDENT_AMBULATORY_CARE_PROVIDER_SITE_OTHER): Payer: 59 | Admitting: Family Medicine

## 2016-10-17 ENCOUNTER — Encounter: Payer: Self-pay | Admitting: Family Medicine

## 2016-10-17 ENCOUNTER — Telehealth: Payer: Self-pay

## 2016-10-17 VITALS — BP 120/80 | HR 85 | Temp 98.3°F | Ht 72.0 in | Wt 292.3 lb

## 2016-10-17 DIAGNOSIS — H00033 Abscess of eyelid right eye, unspecified eyelid: Secondary | ICD-10-CM

## 2016-10-17 DIAGNOSIS — H1031 Unspecified acute conjunctivitis, right eye: Secondary | ICD-10-CM | POA: Diagnosis not present

## 2016-10-17 MED ORDER — SULFAMETHOXAZOLE-TRIMETHOPRIM 800-160 MG PO TABS: 1.0000 | ORAL_TABLET | Freq: Two times a day (BID) | ORAL | 0 refills | Status: DC

## 2016-10-17 MED ORDER — AMOXICILLIN 500 MG PO CAPS: 500.0000 mg | ORAL_CAPSULE | Freq: Three times a day (TID) | ORAL | 0 refills | Status: DC

## 2016-10-17 MED ORDER — ERYTHROMYCIN 5 MG/GM OP OINT: 1.0000 "application " | TOPICAL_OINTMENT | Freq: Three times a day (TID) | OPHTHALMIC | 0 refills | Status: DC

## 2016-10-17 NOTE — Telephone Encounter (Signed)
Should eval if he can come over today can work him in.

## 2016-10-17 NOTE — Telephone Encounter (Signed)
I left a message for the pt to call for an appt as we do have openings today.

## 2016-10-17 NOTE — Progress Notes (Signed)
HPI:  Acute visit for "Pink eye": -R eye -Started yesterday -irritation, feels like sand, much in eye throughout the day -hx less severe infection in the eye in the past and feels like comes back from time to time and resolves with compresses -denies: HA, vision changes, nausea, vomiting, pain with eye movements, fever  ROS: See pertinent positives and negatives per HPI.  Past Medical History:  Diagnosis Date  . Allergic drug rash   . Anal fissure    "lanced it w/my hemorrhoids"  . Cancer of skin of back    "don't know which kind"  . Cellulitis    recurrent cellulitis right foot-per patient-per patient in hospital for IV antibiotics 01/2012  . Cellulitis of right lower extremity hospitalized 12/21/2014  . Edema    LE, R>L since child hood  . Erythema nodosum   . Hemorrhoids   . Vasculitis of skin     Past Surgical History:  Procedure Laterality Date  . [place]   "had them lanced; also lanced anal fissure"  . HERNIA REPAIR    . PENIS REVASCULARIZATION SURGERY    . UMBILICAL HERNIA REPAIR  12/24/2007   with Proceed ventral    Family History  Problem Relation Age of Onset  . Prostate cancer Father   . Heart disease Unknown        GRANDFATHER    Social History   Social History  . Marital status: Married    Spouse name: N/A  . Number of children: N/A  . Years of education: N/A   Social History Main Topics  . Smoking status: Never Smoker  . Smokeless tobacco: Never Used  . Alcohol use 3.6 oz/week    6 Cans of beer per week  . Drug use: Yes    Types: Marijuana     Comment: 12/21/2014 "couple times q 3-4 months"  . Sexual activity: Yes   Other Topics Concern  . None   Social History Narrative   Work or School: Insurance account manager Situation: lives with wife and 65 yo twins in 2016      Spiritual Beliefs: non practicing Christian      Lifestyle: started exercise and diet changes in summer 2016           Current Outpatient  Prescriptions:  .  furosemide (LASIX) 20 MG tablet, Take once daily in the morning., Disp: 90 tablet, Rfl: 3 .  amoxicillin (AMOXIL) 500 MG capsule, Take 1 capsule (500 mg total) by mouth 3 (three) times daily., Disp: 30 capsule, Rfl: 0 .  erythromycin ophthalmic ointment, Place 1 application into the right eye 3 (three) times daily., Disp: 3.5 g, Rfl: 0 .  sulfamethoxazole-trimethoprim (BACTRIM DS,SEPTRA DS) 800-160 MG tablet, Take 1 tablet by mouth 2 (two) times daily., Disp: 10 tablet, Rfl: 0  EXAM:  Vitals:   10/17/16 1524  BP: 120/80  Pulse: 85  Temp: 98.3 F (36.8 C)    Body mass index is 39.64 kg/m.  GENERAL: vitals reviewed and listed above, alert, oriented, appears well hydrated and in no acute distress  HEENT: atraumatic, erythema conjunctiva R eye with purulent discharge mild, mild edema and swelling of the eye lids, EOMI without pain, PERRLA, visual acuity grossly intact  NECK: no obvious masses on inspection  LUNGS: clear to auscultation bilaterally, no wheezes, rales or rhonchi, good air movement  CV: HRRR, no peripheral edema  MS: moves all extremities without noticeable abnormality  PSYCH: pleasant and cooperative,  no obvious depression or anxiety  ASSESSMENT AND PLAN:  Discussed the following assessment and plan:  Acute bacterial conjunctivitis of right eye  Cellulitis of right eyelid  -tx with erythro ointment and oral abx given possible mild cellulitis/preorbital cellulitis - discussed risks tx vs disease -advise ER over weekend if worsening or not responding to treatment and optho eval next week given recurrent issues - he agrees to call and schedule   Patient Instructions  Take the antibiotics as instructed.  Two oral antibiotics (bactrim and amoxicillin)  Ointment for the eye before bed.  See the eye doctor next week.  Seek care over the weekend if worsening or not improving with treatment.   Lucretia Kern., DO  He

## 2016-10-17 NOTE — Telephone Encounter (Signed)
I called the pt and scheduled an appt for today at 3pm.   

## 2016-10-17 NOTE — Telephone Encounter (Signed)
Patient came in to office to ask for refill of the eye drops that he was given 12/17 for pink eye. He has the same symptoms of redness/irritation/discharge of the Right eye. He would like to have a rx sent to his pharmacy. He is willing to come in and be seen if need be, but he would rather not as he is very busy.  Please call him to advise of recommendations [phone]   Dr. Maudie Mercury - Please advise. Thanks!

## 2016-10-17 NOTE — Patient Instructions (Signed)
Take the antibiotics as instructed.  Two oral antibiotics (bactrim and amoxicillin)  Ointment for the eye before bed.  See the eye doctor next week.  Seek care over the weekend if worsening or not improving with treatment.

## 2016-10-17 NOTE — Telephone Encounter (Signed)
Benjamin Mcdowell - Would you please call pt and advise? Thanks!

## 2016-11-16 NOTE — Progress Notes (Signed)
HPI:  Benjamin Mcdowell is a pleasant 51 y.o. here for follow up. Chronic medical problems summarized below were reviewed for changes and stability and were updated as needed below. These issues and their treatment remain stable for the most part.  Reports she is doing well for the most part.  He only takes his Lasix a few days per week, mainly because he forgets.  He is not exercising.  He has not been wearing his compression socks.  He reports his diet is not great. Denies CP, SOB, DOE, treatment intolerance or new symptoms. At the end of the appointment he mentions some right lower upper posterior arm pain.  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  He has some soreness in here with extension activities of the elbow.  He does not remember trauma, swelling, redness, weakness or numbness. He has not completed the Cologuard test for colon cancer screening.  He agrees to complete this. Due for bmp, flu vaccine, colon ca screening  Obesity, Hyperglycemia, Hypertriglyceridemia:  Chronic LE edema, hx recurrent cellulitis, venous insuff, lymphedema: -hx recurrent hospitalizations for sepsis 2ndary to cellulitis of the R leg -seeing Dr. Scot Dock - Vasc Surg - elevation, compression, water aerobics, exercise advised -meds: lasix   ROS: See pertinent positives and negatives per HPI.  Past Medical History:  Diagnosis Date  . Allergic drug rash   . Anal fissure    "lanced it w/my hemorrhoids"  . Cancer of skin of back    "don't know which kind"  . Cellulitis    recurrent cellulitis right foot-per patient-per patient in hospital for IV antibiotics 01/2012  . Cellulitis of right lower extremity hospitalized 12/21/2014  . Edema    LE, R>L since child hood  . Erythema nodosum   . Hemorrhoids   . Vasculitis of skin     Past Surgical History:  Procedure Laterality Date  . [place]   "had them lanced; also lanced anal fissure"  . HERNIA REPAIR    .  PENIS REVASCULARIZATION SURGERY    . UMBILICAL HERNIA REPAIR  12/24/2007   with Proceed ventral    Family History  Problem Relation Age of Onset  . Prostate cancer Father   . Heart disease Unknown        GRANDFATHER    Social History   Socioeconomic History  . Marital status: Married    Spouse name: None  . Number of children: None  . Years of education: None  . Highest education level: None  Social Needs  . Financial resource strain: None  . Food insecurity - worry: None  . Food insecurity - inability: None  . Transportation needs - medical: None  . Transportation needs - non-medical: None  Occupational History  . None  Tobacco Use  . Smoking status: Never Smoker  . Smokeless tobacco: Never Used  Substance and Sexual Activity  . Alcohol use: Yes    Alcohol/week: 3.6 oz    Types: 6 Cans of beer per week  . Drug use: Yes    Types: Marijuana    Comment: 12/21/2014 "couple times q 3-4 months"  . Sexual activity: Yes  Other Topics Concern  . 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  furosemide (LASIX) 20 MG tablet, Take once daily in the morning., Disp: 90 tablet, Rfl: 3  EXAM:  Vitals:   11/17/16 0841  BP: 110/80  Pulse: 71  Temp: 98.1 F (36.7 C)    Body mass index is 39.9 kg/m.  GENERAL: vitals reviewed and listed above, alert, oriented, appears well hydrated and in no acute distress  HEENT: atraumatic, conjunttiva clear, no obvious abnormalities on inspection of external nose and ears  NECK: no obvious masses on inspection  LUNGS: clear to auscultation bilaterally, no wheezes, rales or rhonchi, good air movement  CV: HRRR, 1+ pitting edema in the right lower extremity at the knee.  MS: moves all  extremities without noticeable abnormality, normal range of motion and strength in the upper extremities bilaterally, normal radial pulses, no redness or swelling, he has tenderness in the distal triceps area on exam of the right arm.  PSYCH: pleasant and cooperative, no obvious depression or anxiety  ASSESSMENT AND PLAN:  Discussed the following assessment and plan:  BMI 39.0-39.9,adult  Hyperglycemia - Plan: Basic metabolic panel, Hemoglobin A1c  Hypertriglyceridemia - Plan: Lipid panel  Need for immunization against influenza - Plan: Flu Vaccine QUAD 6+ mos PF IM (Fluarix Quad PF)  Pain of right upper extremity  -Lifestyle recommendations discussed at length, I advised a very low sugar healthy diet and regular aerobic exercise -Advised weight reduction with a 20-40 pound weight loss goal over the next year -We discussed several formal programs to assist him with weight loss and he may consider the weight watchers program -Labs today -Flu shot -Advised colon cancer screening, he is late on this promptly as possible, he still wants to do the Cologuard test reports he has this at home to complete -Suspect triceps tendinitis versus OA of the arm, opted for gentle home exercises and ice, advised him to call in 3 weeks if this is not improving or resolved, will have him see sports medicine -Patient advised to return or notify a doctor immediately if symptoms worsen or persist or new concerns arise.  Patient Instructions  BEFORE YOU LEAVE: -follow up: 3- 4 months -labs -flu shot  PLEASE complete the cologuard test  wear your compression socks  Regular exercise, healthy diet.  Ice and gentle exercise for th elbow, call in 3 weeks if not better and we will have you see the sports medicine doctor      Colin Benton R., DO

## 2016-11-17 ENCOUNTER — Ambulatory Visit: Payer: 59 | Admitting: Family Medicine

## 2016-11-17 ENCOUNTER — Encounter: Payer: Self-pay | Admitting: Family Medicine

## 2016-11-17 VITALS — BP 110/80 | HR 71 | Temp 98.1°F | Ht 72.0 in | Wt 294.2 lb

## 2016-11-17 DIAGNOSIS — E781 Pure hyperglyceridemia: Secondary | ICD-10-CM

## 2016-11-17 DIAGNOSIS — Z23 Encounter for immunization: Secondary | ICD-10-CM

## 2016-11-17 DIAGNOSIS — Z6839 Body mass index (BMI) 39.0-39.9, adult: Secondary | ICD-10-CM | POA: Diagnosis not present

## 2016-11-17 DIAGNOSIS — R739 Hyperglycemia, unspecified: Secondary | ICD-10-CM

## 2016-11-17 DIAGNOSIS — M79601 Pain in right arm: Secondary | ICD-10-CM | POA: Diagnosis not present

## 2016-11-17 LAB — BASIC METABOLIC PANEL
BUN: 13 mg/dL (ref 6–23)
CHLORIDE: 106 meq/L (ref 96–112)
CO2: 27 mEq/L (ref 19–32)
Calcium: 9.2 mg/dL (ref 8.4–10.5)
Creatinine, Ser: 0.89 mg/dL (ref 0.40–1.50)
GFR: 95.68 mL/min (ref >60.00)
Glucose, Bld: 114 mg/dL — ABNORMAL HIGH (ref 70–99)
Potassium: 4.1 mEq/L (ref 3.5–5.1)
SODIUM: 142 meq/L (ref 135–145)

## 2016-11-17 LAB — LIPID PANEL
CHOLESTEROL: 168 mg/dL (ref 0–200)
HDL: 35.6 mg/dL — ABNORMAL LOW (ref >39.00)
LDL CALC: 96 mg/dL (ref 0–99)
NonHDL: 132.22
TRIGLYCERIDES: 179 mg/dL — AB (ref 0.0–149.0)
Total CHOL/HDL Ratio: 5
VLDL: 35.8 mg/dL (ref 0.0–40.0)

## 2016-11-17 LAB — HEMOGLOBIN A1C: HEMOGLOBIN A1C: 5.7 % (ref 4.6–6.5)

## 2016-11-17 NOTE — Patient Instructions (Addendum)
BEFORE YOU LEAVE: -follow up: 3- 4 months -labs -flu shot  PLEASE complete the cologuard test  wear your compression socks  Regular exercise, healthy diet.  Ice and gentle exercise for th elbow, call in 3 weeks if not better and we will have you see the sports medicine doctor

## 2016-12-27 IMAGING — CT CT FOOT*R* W/O CM
2 of 5 series · 10 of 36 positions shown, 13 images · non-contrast
Comparison: Radiographs 01/05/2013.  No recent studies.

CLINICAL DATA: Enhancing right foot cellulitis with pain and
swelling.

EXAM:
CT OF THE RIGHT FOOT WITHOUT CONTRAST
TECHNIQUE: Multidetector CT imaging of the right foot was performed according
to the standard protocol. Multiplanar CT image reconstructions were
also generated.

[Series 206: axial soft · axial · 0.34mm/px · z∈[+53,+280]mm · 9 of 141 slices shown, 12 images]
[im 11/141  soft-tissue]
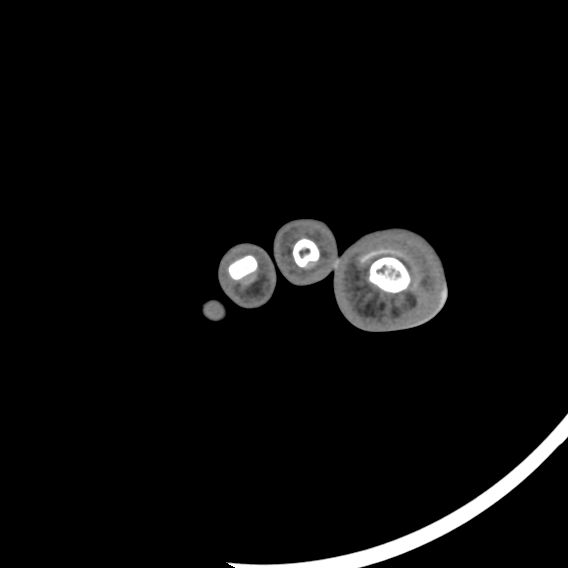
[im 11/141  bone]
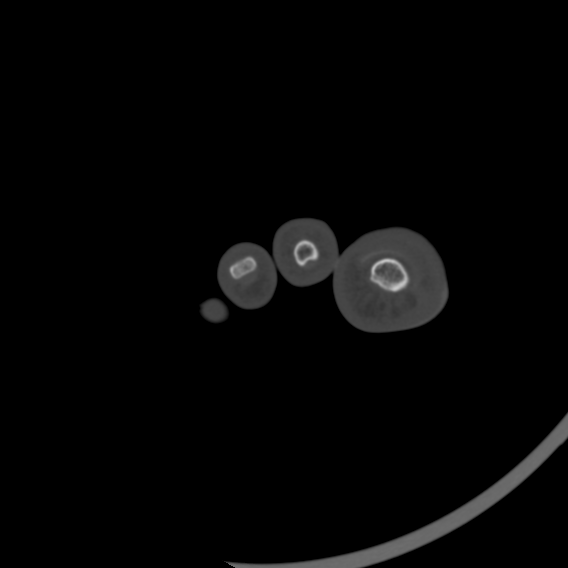
[im 33/141  bone]
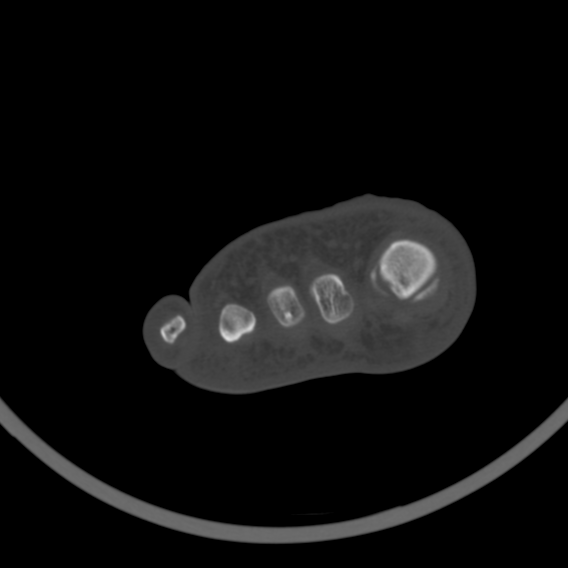
[im 44/141  bone]
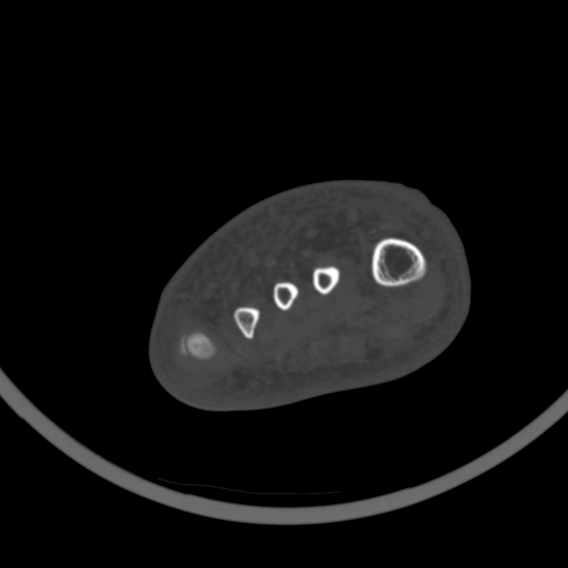
[im 54/141  bone]
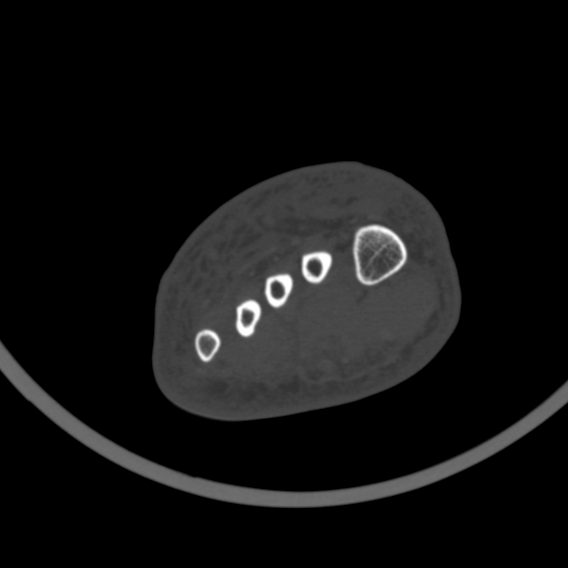
[im 76/141  soft-tissue]
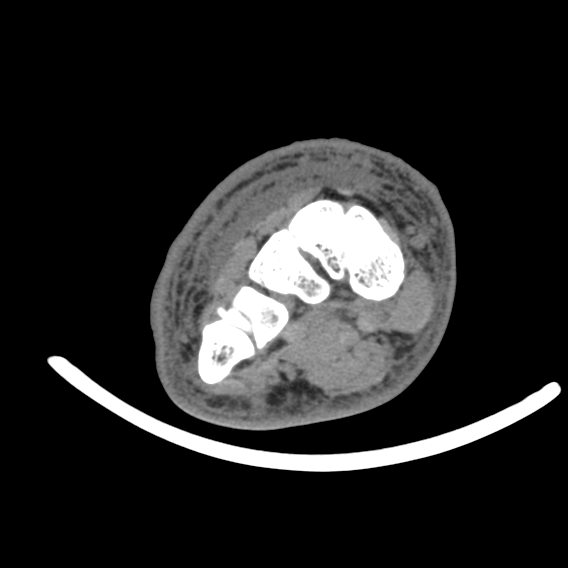
[im 76/141  bone]
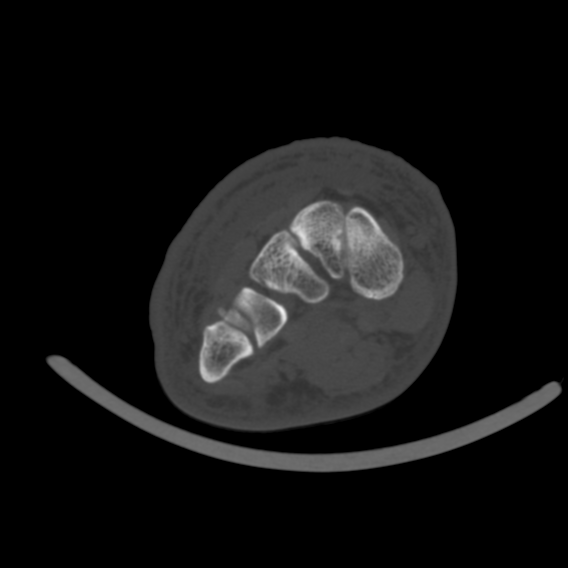
[im 87/141  bone]
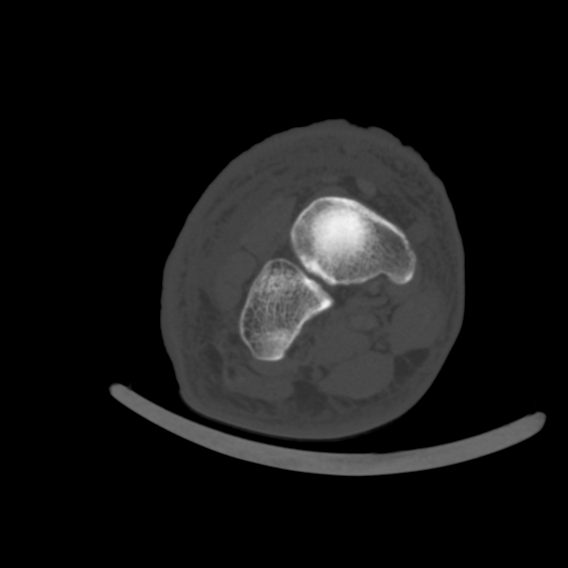
[im 97/141  bone]
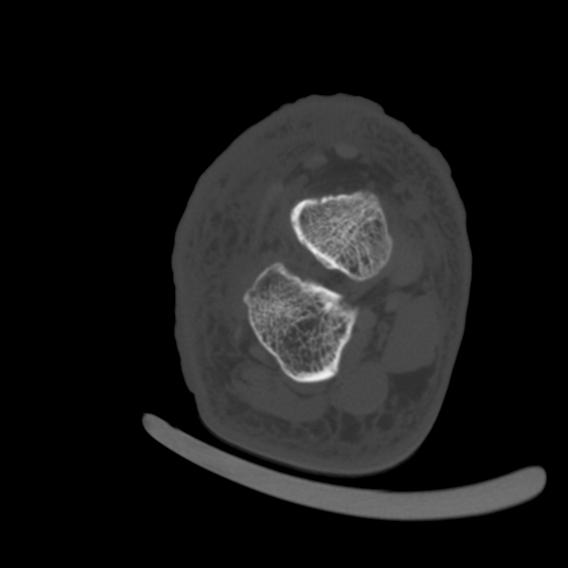
[im 119/141  bone]
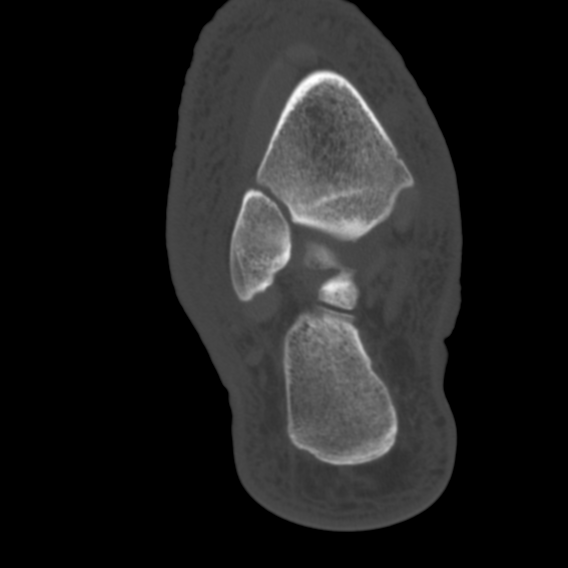
[im 130/141  soft-tissue]
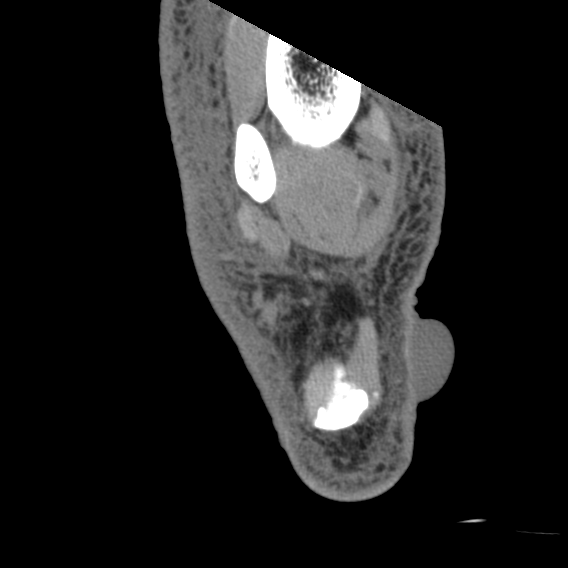
[im 130/141  bone]
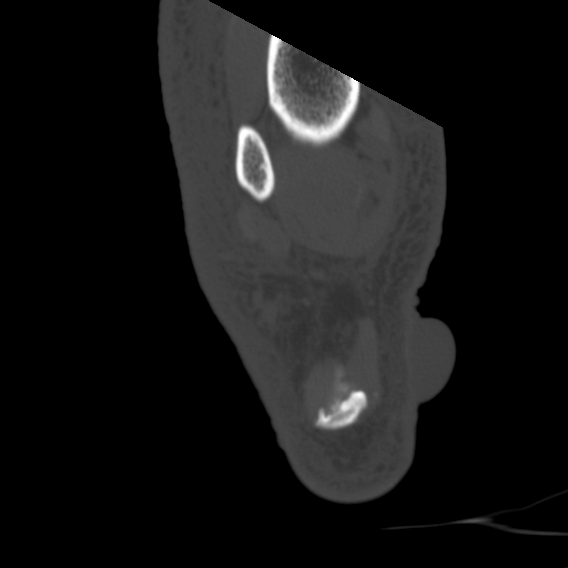

[Series 207: cor bone · coronal · 0.28mm/px · 1 of 86 slices shown]
[im 43/86  bone]
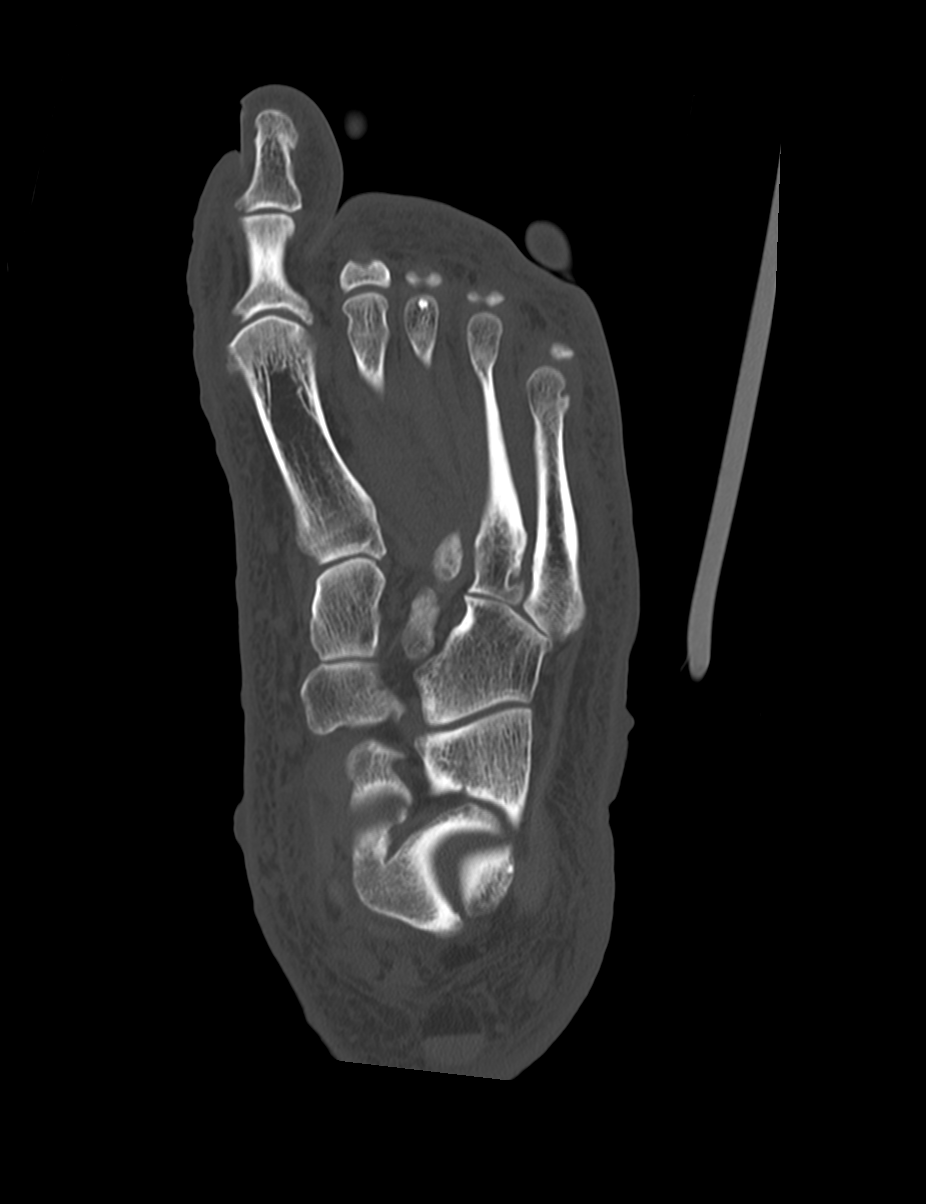

[10 of 36 positions shown; findings below may reference images not displayed]

FINDINGS: There is moderate subcutaneous edema throughout the foot, greatest
dorsally. No focal fluid collections are identified on noncontrast
imaging. There is apparent skin blistering along the medial aspect
of the hindfoot, measuring up to 3.3 cm in diameter.

As evaluated by CT, the ankle tendons appear intact without
significant tenosynovitis. There is no significant ankle joint
effusion. There are no significant arthropathic changes other than
mild degenerative changes at the first MTP joint. No evidence of
acute fracture, dislocation or osteomyelitis.
IMPRESSION: 1. Generalized subcutaneous edema throughout the foot consistent
with given clinical impression of cellulitis. No focal abscess
identified on noncontrast imaging.
2. Skin blister involving the medial hindfoot.
3. No evidence of osteomyelitis.

## 2017-01-14 ENCOUNTER — Emergency Department (HOSPITAL_COMMUNITY): Payer: 59

## 2017-01-14 ENCOUNTER — Encounter (HOSPITAL_COMMUNITY): Payer: Self-pay | Admitting: Emergency Medicine

## 2017-01-14 ENCOUNTER — Other Ambulatory Visit: Payer: Self-pay

## 2017-01-14 ENCOUNTER — Inpatient Hospital Stay (HOSPITAL_COMMUNITY)
Admission: EM | Admit: 2017-01-14 | Discharge: 2017-01-19 | DRG: 872 | Disposition: A | Discharge status: Home / Self Care | Payer: 59 | Attending: Family Medicine | Admitting: Family Medicine

## 2017-01-14 DIAGNOSIS — L52 Erythema nodosum: Secondary | ICD-10-CM | POA: Diagnosis present

## 2017-01-14 DIAGNOSIS — E860 Dehydration: Secondary | ICD-10-CM | POA: Diagnosis present

## 2017-01-14 DIAGNOSIS — E669 Obesity, unspecified: Secondary | ICD-10-CM | POA: Diagnosis present

## 2017-01-14 DIAGNOSIS — L03115 Cellulitis of right lower limb: Secondary | ICD-10-CM | POA: Diagnosis not present

## 2017-01-14 DIAGNOSIS — A419 Sepsis, unspecified organism: Secondary | ICD-10-CM | POA: Diagnosis not present

## 2017-01-14 DIAGNOSIS — Z8042 Family history of malignant neoplasm of prostate: Secondary | ICD-10-CM

## 2017-01-14 DIAGNOSIS — R600 Localized edema: Secondary | ICD-10-CM | POA: Diagnosis present

## 2017-01-14 DIAGNOSIS — Z85828 Personal history of other malignant neoplasm of skin: Secondary | ICD-10-CM

## 2017-01-14 DIAGNOSIS — B351 Tinea unguium: Secondary | ICD-10-CM | POA: Diagnosis present

## 2017-01-14 DIAGNOSIS — N179 Acute kidney failure, unspecified: Secondary | ICD-10-CM | POA: Diagnosis present

## 2017-01-14 DIAGNOSIS — I890 Lymphedema, not elsewhere classified: Secondary | ICD-10-CM | POA: Diagnosis present

## 2017-01-14 DIAGNOSIS — Z6839 Body mass index (BMI) 39.0-39.9, adult: Secondary | ICD-10-CM

## 2017-01-14 DIAGNOSIS — R591 Generalized enlarged lymph nodes: Secondary | ICD-10-CM | POA: Diagnosis present

## 2017-01-14 DIAGNOSIS — R509 Fever, unspecified: Secondary | ICD-10-CM | POA: Diagnosis not present

## 2017-01-14 LAB — URINALYSIS, ROUTINE W REFLEX MICROSCOPIC
Bilirubin Urine: NEGATIVE
GLUCOSE, UA: NEGATIVE mg/dL
HGB URINE DIPSTICK: NEGATIVE
Ketones, ur: NEGATIVE mg/dL
LEUKOCYTES UA: NEGATIVE
Nitrite: NEGATIVE
PH: 5 (ref 5.0–8.0)
PROTEIN: NEGATIVE mg/dL
SPECIFIC GRAVITY, URINE: 1.012 (ref 1.005–1.030)

## 2017-01-14 LAB — CBC WITH DIFFERENTIAL/PLATELET
BASOS PCT: 0 %
Basophils Absolute: 0 10*3/uL (ref 0.0–0.1)
Eosinophils Absolute: 0 10*3/uL (ref 0.0–0.7)
Eosinophils Relative: 0 %
HEMATOCRIT: 45.6 % (ref 39.0–52.0)
HEMOGLOBIN: 15.4 g/dL (ref 13.0–17.0)
LYMPHS ABS: 0.6 10*3/uL — AB (ref 0.7–4.0)
Lymphocytes Relative: 3 %
MCH: 28.4 pg (ref 26.0–34.0)
MCHC: 33.8 g/dL (ref 30.0–36.0)
MCV: 84.1 fL (ref 78.0–100.0)
MONO ABS: 1.6 10*3/uL — AB (ref 0.1–1.0)
MONOS PCT: 8 %
NEUTROS ABS: 17.4 10*3/uL — AB (ref 1.7–7.7)
NEUTROS PCT: 89 %
Platelets: 191 10*3/uL (ref 150–400)
RBC: 5.42 MIL/uL (ref 4.22–5.81)
RDW: 13.2 % (ref 11.5–15.5)
WBC: 19.6 10*3/uL — ABNORMAL HIGH (ref 4.0–10.5)

## 2017-01-14 LAB — COMPREHENSIVE METABOLIC PANEL
ALBUMIN: 4 g/dL (ref 3.5–5.0)
ALT: 131 U/L — ABNORMAL HIGH (ref 17–63)
AST: 64 U/L — AB (ref 15–41)
Alkaline Phosphatase: 62 U/L (ref 38–126)
Anion gap: 10 (ref 5–15)
BILIRUBIN TOTAL: 1.8 mg/dL — AB (ref 0.3–1.2)
BUN: 12 mg/dL (ref 6–20)
CHLORIDE: 104 mmol/L (ref 101–111)
CO2: 23 mmol/L (ref 22–32)
CREATININE: 1.31 mg/dL — AB (ref 0.61–1.24)
Calcium: 9.2 mg/dL (ref 8.9–10.3)
GFR calc Af Amer: >60 mL/min (ref >60)
GLUCOSE: 124 mg/dL — AB (ref 65–99)
POTASSIUM: 3.8 mmol/L (ref 3.5–5.1)
Sodium: 137 mmol/L (ref 135–145)
Total Protein: 6.8 g/dL (ref 6.5–8.1)

## 2017-01-14 LAB — I-STAT CG4 LACTIC ACID, ED
LACTIC ACID, VENOUS: 3.35 mmol/L — AB (ref 0.5–1.9)
Lactic Acid, Venous: 2.91 mmol/L (ref 0.5–1.9)
Lactic Acid, Venous: 4.14 mmol/L (ref 0.5–1.9)

## 2017-01-14 LAB — PROTIME-INR
INR: 1.02
Prothrombin Time: 13.3 seconds (ref 11.4–15.2)

## 2017-01-14 MED ORDER — VANCOMYCIN HCL 10 G IV SOLR
2500.0000 mg | Freq: Once | INTRAVENOUS | Status: AC
  Administered 2017-01-14: 2500 mg via INTRAVENOUS
  Filled 2017-01-14: qty 2500

## 2017-01-14 MED ORDER — SODIUM CHLORIDE 0.9 % IV BOLUS (SEPSIS)
1000.0000 mL | Freq: Once | INTRAVENOUS | Status: AC
  Administered 2017-01-14: 1000 mL via INTRAVENOUS

## 2017-01-14 MED ORDER — VANCOMYCIN HCL IN DEXTROSE 1-5 GM/200ML-% IV SOLN: 1000.0000 mg | Freq: Once | INTRAVENOUS | Status: DC

## 2017-01-14 MED ORDER — PIPERACILLIN-TAZOBACTAM 3.375 G IVPB 30 MIN
3.3750 g | Freq: Once | INTRAVENOUS | Status: AC
  Administered 2017-01-14: 3.375 g via INTRAVENOUS
  Filled 2017-01-14: qty 50

## 2017-01-14 MED ORDER — ACETAMINOPHEN 325 MG PO TABS
650.0000 mg | ORAL_TABLET | Freq: Once | ORAL | Status: AC
  Administered 2017-01-15: 650 mg via ORAL
  Filled 2017-01-14: qty 2

## 2017-01-14 MED ORDER — PIPERACILLIN-TAZOBACTAM 3.375 G IVPB: 3.3750 g | Freq: Three times a day (TID) | INTRAVENOUS | Status: DC

## 2017-01-14 MED ORDER — ACETAMINOPHEN 325 MG PO TABS
650.0000 mg | ORAL_TABLET | Freq: Once | ORAL | Status: AC
  Administered 2017-01-14: 650 mg via ORAL
  Filled 2017-01-14: qty 2

## 2017-01-14 MED ORDER — VANCOMYCIN HCL IN DEXTROSE 1-5 GM/200ML-% IV SOLN
1000.0000 mg | Freq: Two times a day (BID) | INTRAVENOUS | Status: DC
  Administered 2017-01-15 – 2017-01-16 (×4): 1000 mg via INTRAVENOUS
  Filled 2017-01-14 (×5): qty 200

## 2017-01-14 MED ORDER — DEXTROSE 5 % IV SOLN: 2.0000 g | Freq: Once | INTRAVENOUS | Status: DC

## 2017-01-14 NOTE — ED Notes (Signed)
Patient transported to X-ray 

## 2017-01-14 NOTE — ED Notes (Signed)
ED Provider at bedside. 

## 2017-01-14 NOTE — Progress Notes (Signed)
Pharmacy Antibiotic Note  Benjamin Mcdowell is a 52 y.o. male admitted on 01/14/2017 with cellulitis.  Pharmacy has been consulted for vancomycin/zosyn dosing. Tmax/24h 102.5, WBC 19.6, LA 3.35. SCr 1.31 on admit, normalized CrCl~65-70.  Plan: Zosyn 3.375g IV (65min infusion) x1; then 3.375g IV q8h (4h infusion) Vancomycin 2500mg  IV x1; then 1g IV q12h Monitor clinical progress, c/s, renal function F/u de-escalation plan/LOT, vancomycin trough as indicated   Height: 6' (182.9 cm) Weight: 290 lb (131.5 kg) IBW/kg (Calculated) : 77.6  Temp (24hrs), Avg:100.4 F (38 C), Min:98.2 F (36.8 C), Max:102.5 F (39.2 C)  Recent Labs  Lab 01/14/17 2015 01/14/17 2033  WBC 19.6*  --   CREATININE 1.31*  --   LATICACIDVEN  --  3.35*    Estimated Creatinine Clearance: 93.6 mL/min (A) (by C-G formula based on SCr of 1.31 mg/dL (H)).    No Known Allergies  Antimicrobials this admission: 1/9 vancomycin >>  1/9 zosyn >>   Dose adjustments this admission:   Microbiology results:   Elicia Lamp, PharmD, BCPS Clinical Pharmacist 01/14/2017 9:44 PM

## 2017-01-14 NOTE — ED Notes (Signed)
Callie-RN @ [facility] notified of elevated CG-4 of 3.35

## 2017-01-14 NOTE — ED Provider Notes (Signed)
[facility] Provider Note   CSN: [id number] Arrival date & time: 01/14/17  1841    History   Chief Complaint Chief Complaint  Patient presents with  . Cellulitis  . Fever    HPI Benjamin Mcdowell is a 52 y.o. male.  52 yo M pt with chronic RLE edema (seen by vascular specialist) and recurrent RLE edema and cellulitis presents to the ED for c/o RLE cellulitis.  He notes onset of generalized body aches and chills earlier today.  He next noticed some lymphadenopathy in his right inguinal region.  This was slightly tender.  He states that this is usually how symptoms began when he has a recurrence of his right leg cellulitis.  He states that his right lower extremity is always edematous.  He has had increasing erythema which she noticed today.  Fever 102.5 F in triage.  He took 2 tablets of Keflex prior to arrival which she was previously prescribed for episodes of cellulitis.  He has not had any numbness, paresthesias, weakness.  No known trauma or injury.  Patient last required hospitalization for cellulitis in 2016.      Past Medical History:  Diagnosis Date  . Allergic drug rash   . Anal fissure    "lanced it w/my hemorrhoids"  . Cancer of skin of back    "don't know which kind"  . Cellulitis    recurrent cellulitis right foot-per patient-per patient in hospital for IV antibiotics 01/2012  . Cellulitis of right lower extremity hospitalized 12/21/2014  . Edema    LE, R>L since child hood  . Erythema nodosum   . Hemorrhoids   . Vasculitis of skin     Patient Active Problem List   Diagnosis Date Noted  . Elevated bilirubin 05/02/2016  . Hyperglycemia   . CHRONIC Edema of right lower extremity 12/21/2014  . Obesity, Class II, BMI 35-39.9, isolated 12/21/2014  . High triglycerides 01/06/2013    Past Surgical History:  Procedure Laterality Date  . [place]   "had them lanced; also lanced anal fissure"  . HERNIA REPAIR     . PENIS REVASCULARIZATION SURGERY    . UMBILICAL HERNIA REPAIR  12/24/2007   with Proceed ventral       Home Medications    Prior to Admission medications   Medication Sig Start Date End Date Taking? Authorizing Provider  furosemide (LASIX) 20 MG tablet Take once daily in the morning. Patient not taking: Reported on 01/14/2017 05/04/16   Hosie Poisson, MD    Family History Family History  Problem Relation Age of Onset  . Prostate cancer Father   . Heart disease Unknown        GRANDFATHER    Social History Social History   Tobacco Use  . Smoking status: Never Smoker  . Smokeless tobacco: Never Used  Substance Use Topics  . Alcohol use: Yes    Alcohol/week: 3.6 oz    Types: 6 Cans of beer per week  . Drug use: Yes    Types: Marijuana    Comment: 12/21/2014 "couple times q 3-4 months"     Allergies   Patient has no known allergies.   Review of Systems Review of Systems Ten systems reviewed and are negative for acute change, except as noted in the HPI.    Physical Exam Updated Vital Signs BP 133/87 (BP Location: Left Arm)   Pulse (!) 102   Temp 99 F (37.2 C) (Oral)   Resp Marland Kitchen)  25   Ht 6' (1.829 m)   Wt 131.5 kg (290 lb)   SpO2 100%   BMI 39.33 kg/m   Physical Exam  Constitutional: He is oriented to person, place, and time. He appears well-developed and well-nourished. No distress.  Nontoxic appearing and in NAD  HENT:  Head: Normocephalic and atraumatic.  Eyes: Conjunctivae and EOM are normal. No scleral icterus.  Neck: Normal range of motion.  Cardiovascular: Normal rate, regular rhythm and intact distal pulses.  DP pulse 2+ in the RLE  Pulmonary/Chest: Effort normal. No respiratory distress.  Respirations even and unlabored  Musculoskeletal: Normal range of motion. He exhibits edema.  Erythema and heat to touch from the R ankle to the R knee. Area is also edematous. No crepitus.  Neurological: He is alert and oriented to person, place, and time.  He exhibits normal muscle tone. Coordination normal.  Sensation to light touch intact. Patient moving all extremities.  Skin: Skin is warm and dry. No rash noted. He is not diaphoretic. There is erythema (RLE). No pallor.  Psychiatric: He has a normal mood and affect. His behavior is normal.  Nursing note and vitals reviewed.      ED Treatments / Results  Labs (all labs ordered are listed, but only abnormal results are displayed) Labs Reviewed  COMPREHENSIVE METABOLIC PANEL - Abnormal; Notable for the following components:      Result Value   Glucose, Bld 124 (*)    Creatinine, Ser 1.31 (*)    AST 64 (*)    ALT 131 (*)    Total Bilirubin 1.8 (*)    All other components within normal limits  CBC WITH DIFFERENTIAL/PLATELET - Abnormal; Notable for the following components:   WBC 19.6 (*)    Neutro Abs 17.4 (*)    Lymphs Abs 0.6 (*)    Monocytes Absolute 1.6 (*)    All other components within normal limits  I-STAT CG4 LACTIC ACID, ED - Abnormal; Notable for the following components:   Lactic Acid, Venous 3.35 (*)    All other components within normal limits  I-STAT CG4 LACTIC ACID, ED - Abnormal; Notable for the following components:   Lactic Acid, Venous 4.14 (*)    All other components within normal limits  I-STAT CG4 LACTIC ACID, ED - Abnormal; Notable for the following components:   Lactic Acid, Venous 2.91 (*)    All other components within normal limits  CULTURE, BLOOD (ROUTINE X 2)  CULTURE, BLOOD (ROUTINE X 2)  PROTIME-INR  URINALYSIS, ROUTINE W REFLEX MICROSCOPIC    EKG  EKG Interpretation None       Radiology Dg Chest 2 View  Result Date: 01/14/2017 CLINICAL DATA:  52 year old male with fever. EXAM: CHEST  2 VIEW COMPARISON:  Chest radiograph dated 05/02/2016 FINDINGS: The heart size and mediastinal contours are within normal limits. Both lungs are clear. The visualized skeletal structures are unremarkable. IMPRESSION: No active cardiopulmonary disease.  Electronically Signed   By: Anner Crete M.D.   On: 01/14/2017 21:16   Dg Tibia/fibula Right  Result Date: 01/14/2017 CLINICAL DATA:  Cellulitis of the right lower leg. EXAM: RIGHT TIBIA AND FIBULA - 2 VIEW COMPARISON:  12/24/2014 foot CT FINDINGS: There is subcutaneous edema throughout the visualized leg correlating with history of cellulitis. No soft tissue gas, opaque foreign body, or osseous erosion. IMPRESSION: Cellulitis without soft tissue gas or opaque foreign body. No evidence of osteomyelitis. Electronically Signed   By: Monte Fantasia M.D.   On: 01/14/2017  22:33    Procedures Procedures (including critical care time)  Medications Ordered in ED Medications  vancomycin (VANCOCIN) 2,500 mg in sodium chloride 0.9 % 500 mL IVPB (2,500 mg Intravenous New Bag/Given 01/14/17 2348)  piperacillin-tazobactam (ZOSYN) IVPB 3.375 g (not administered)  vancomycin (VANCOCIN) IVPB 1000 mg/200 mL premix (not administered)  acetaminophen (TYLENOL) tablet 650 mg (650 mg Oral Given 01/14/17 1953)  sodium chloride 0.9 % bolus 1,000 mL (0 mLs Intravenous Stopped 01/14/17 2258)    And  sodium chloride 0.9 % bolus 1,000 mL (0 mLs Intravenous Stopped 01/14/17 2339)    And  sodium chloride 0.9 % bolus 1,000 mL (0 mLs Intravenous Stopped 01/15/17 0046)    And  sodium chloride 0.9 % bolus 1,000 mL (1,000 mLs Intravenous New Bag/Given 01/14/17 2346)  piperacillin-tazobactam (ZOSYN) IVPB 3.375 g (0 g Intravenous Stopped 01/14/17 2221)  acetaminophen (TYLENOL) tablet 650 mg (650 mg Oral Given 01/15/17 0047)     Initial Impression / Assessment and Plan / ED Course  I have reviewed the triage vital signs and the nursing notes.  Pertinent labs & imaging results that were available during my care of the patient were reviewed by me and considered in my medical decision making (see chart for details).     61:31 PM 52 year old male with a history of recurrent right lower extremity cellulitis as well as chronic right  lower extremity lymphedema presents to the emergency department for evaluation of fever and chills with associated erythema to the right lower extremity.  This is consistent with prior episodes of cellulitis.  Last admission for same in 2016. Patient neurovascularly intact.  He states that he does not feel as bad as he did on arrival.  This is likely due to fever improvement with Tylenol.  He notes that he has often been "worse off" by the time he comes to the hospital for treatment.  Given improving vital signs and history of same with good outpatient primary care follow-up, have discussed treatment with IV antibiotics as well as IV fluids with reassessment and potential for outpatient follow-up.  Patient would prefer outpatient follow-up if possible.  We will continue to monitor.  11:00 PM X-ray of the right lower extremity negative for evidence of subcutaneous gas. Zosyn completed. Pending Vancomycin.  1:03 AM Tachycardia is slightly worsening and patient is complaining of chills.  This is likely from a fever recurrence after Tylenol has worn off.  This was redosed approximately 10 minutes ago.  Vancomycin infusing.  Zosyn given.  Lactate has not completely normalized.  Given persistent concern for sepsis, will plan for admission.  Patient agreeable to plan.  [facility] paged.   Final Clinical Impressions(s) / ED Diagnoses   Final diagnoses:  Cellulitis of right lower extremity  Sepsis, due to unspecified organism [facility])    ED Discharge Orders    None       Antonietta Breach, PA-C 01/15/17 0109    Charlesetta Shanks, MD 01/23/17 1454

## 2017-01-14 NOTE — ED Triage Notes (Signed)
P:t to ED with c/o cellulitis to right lower leg.  St's he has hx of same.  Pt c/o elevated temp, chills and aching all over.  Onset earlier today

## 2017-01-14 NOTE — ED Notes (Signed)
Pt may drink.

## 2017-01-15 ENCOUNTER — Other Ambulatory Visit: Payer: Self-pay

## 2017-01-15 ENCOUNTER — Observation Stay (HOSPITAL_COMMUNITY): Payer: 59

## 2017-01-15 DIAGNOSIS — L03115 Cellulitis of right lower limb: Secondary | ICD-10-CM | POA: Diagnosis not present

## 2017-01-15 DIAGNOSIS — R600 Localized edema: Secondary | ICD-10-CM | POA: Diagnosis not present

## 2017-01-15 DIAGNOSIS — N179 Acute kidney failure, unspecified: Secondary | ICD-10-CM | POA: Diagnosis not present

## 2017-01-15 DIAGNOSIS — Z85828 Personal history of other malignant neoplasm of skin: Secondary | ICD-10-CM | POA: Diagnosis not present

## 2017-01-15 DIAGNOSIS — L0390 Cellulitis, unspecified: Secondary | ICD-10-CM | POA: Diagnosis not present

## 2017-01-15 DIAGNOSIS — B351 Tinea unguium: Secondary | ICD-10-CM | POA: Diagnosis present

## 2017-01-15 DIAGNOSIS — Z8042 Family history of malignant neoplasm of prostate: Secondary | ICD-10-CM | POA: Diagnosis not present

## 2017-01-15 DIAGNOSIS — A419 Sepsis, unspecified organism: Principal | ICD-10-CM

## 2017-01-15 DIAGNOSIS — R591 Generalized enlarged lymph nodes: Secondary | ICD-10-CM | POA: Diagnosis present

## 2017-01-15 DIAGNOSIS — M7989 Other specified soft tissue disorders: Secondary | ICD-10-CM | POA: Diagnosis not present

## 2017-01-15 DIAGNOSIS — Z6839 Body mass index (BMI) 39.0-39.9, adult: Secondary | ICD-10-CM | POA: Diagnosis not present

## 2017-01-15 DIAGNOSIS — M79609 Pain in unspecified limb: Secondary | ICD-10-CM

## 2017-01-15 DIAGNOSIS — E669 Obesity, unspecified: Secondary | ICD-10-CM | POA: Diagnosis present

## 2017-01-15 DIAGNOSIS — E860 Dehydration: Secondary | ICD-10-CM | POA: Diagnosis present

## 2017-01-15 DIAGNOSIS — L52 Erythema nodosum: Secondary | ICD-10-CM | POA: Diagnosis present

## 2017-01-15 LAB — BASIC METABOLIC PANEL
Anion gap: 7 (ref 5–15)
BUN: 13 mg/dL (ref 6–20)
CHLORIDE: 110 mmol/L (ref 101–111)
CO2: 24 mmol/L (ref 22–32)
CREATININE: 1.27 mg/dL — AB (ref 0.61–1.24)
Calcium: 7.9 mg/dL — ABNORMAL LOW (ref 8.9–10.3)
GFR calc non Af Amer: >60 mL/min (ref >60)
Glucose, Bld: 150 mg/dL — ABNORMAL HIGH (ref 65–99)
Potassium: 4.4 mmol/L (ref 3.5–5.1)
Sodium: 141 mmol/L (ref 135–145)

## 2017-01-15 LAB — LACTIC ACID, PLASMA
LACTIC ACID, VENOUS: 2.7 mmol/L — AB (ref 0.5–1.9)
Lactic Acid, Venous: 1.8 mmol/L (ref 0.5–1.9)

## 2017-01-15 LAB — CBC
HCT: 39.3 % (ref 39.0–52.0)
HEMOGLOBIN: 12.9 g/dL — AB (ref 13.0–17.0)
MCH: 27.7 pg (ref 26.0–34.0)
MCHC: 32.8 g/dL (ref 30.0–36.0)
MCV: 84.5 fL (ref 78.0–100.0)
Platelets: 155 10*3/uL (ref 150–400)
RBC: 4.65 MIL/uL (ref 4.22–5.81)
RDW: 13.5 % (ref 11.5–15.5)
WBC: 15.1 10*3/uL — ABNORMAL HIGH (ref 4.0–10.5)

## 2017-01-15 LAB — CREATININE, URINE, RANDOM: Creatinine, Urine: 108.31 mg/dL

## 2017-01-15 LAB — SEDIMENTATION RATE: Sed Rate: 4 mm/hr (ref 0–16)

## 2017-01-15 LAB — C-REACTIVE PROTEIN: CRP: 7.7 mg/dL — ABNORMAL HIGH (ref <1.0)

## 2017-01-15 MED ORDER — SODIUM CHLORIDE 0.9 % IV SOLN
INTRAVENOUS | Status: DC
  Administered 2017-01-15: 04:00:00 via INTRAVENOUS

## 2017-01-15 MED ORDER — ACETAMINOPHEN 325 MG PO TABS
650.0000 mg | ORAL_TABLET | Freq: Four times a day (QID) | ORAL | Status: DC | PRN
  Administered 2017-01-15 – 2017-01-18 (×8): 650 mg via ORAL
  Filled 2017-01-15 (×10): qty 2

## 2017-01-15 MED ORDER — ZOLPIDEM TARTRATE 5 MG PO TABS: 5.0000 mg | ORAL_TABLET | Freq: Every evening | ORAL | Status: DC | PRN

## 2017-01-15 MED ORDER — ONDANSETRON HCL 4 MG/2ML IJ SOLN: 4.0000 mg | Freq: Three times a day (TID) | INTRAMUSCULAR | Status: DC | PRN

## 2017-01-15 MED ORDER — SENNOSIDES-DOCUSATE SODIUM 8.6-50 MG PO TABS: 1.0000 | ORAL_TABLET | Freq: Every evening | ORAL | Status: DC | PRN

## 2017-01-15 MED ORDER — ENOXAPARIN SODIUM 60 MG/0.6ML ~~LOC~~ SOLN
60.0000 mg | SUBCUTANEOUS | Status: DC
  Administered 2017-01-15 – 2017-01-19 (×5): 60 mg via SUBCUTANEOUS
  Filled 2017-01-15 (×5): qty 0.6

## 2017-01-15 MED ORDER — ENOXAPARIN SODIUM 40 MG/0.4ML ~~LOC~~ SOLN: 40.0000 mg | SUBCUTANEOUS | Status: DC

## 2017-01-15 MED ORDER — FLUCONAZOLE 200 MG PO TABS
200.0000 mg | ORAL_TABLET | Freq: Every day | ORAL | Status: DC
  Administered 2017-01-15 – 2017-01-19 (×5): 200 mg via ORAL
  Filled 2017-01-15 (×6): qty 1

## 2017-01-15 MED ORDER — OXYCODONE-ACETAMINOPHEN 5-325 MG PO TABS
1.0000 | ORAL_TABLET | ORAL | Status: DC | PRN
  Administered 2017-01-16 – 2017-01-18 (×2): 1 via ORAL
  Filled 2017-01-15 (×2): qty 1

## 2017-01-15 NOTE — ED Notes (Signed)
Admitting Provider at bedside. 

## 2017-01-15 NOTE — H&P (Signed)
History and Physical    Benjamin Mcdowell EZM:[id number] DOB: 08-27-1965 DOA: 01/14/2017  Referring MD/NP/PA:   PCP: Lucretia Kern, DO   Patient coming from:  The patient is coming from home.  At baseline, pt is independent for most of ADL.  Chief Complaint: right leg pain, fever and chills  HPI: Benjamin Mcdowell is a 52 y.o. male with medical history significant of chronic bilateral leg edema due to lymphedema, skin cancer, leg cellulitis, who presents with right leg pain, fever and chills.  Pt states that he has chronic bilateral leg edema, but his right leg swelling has worsened today. He has moderate pain in right leg pain. His right leg becomes erythematous. He has fever and chills. Fever 102.5 F in triage.  He took 2 tablets of Keflex prior to arrival which she was previously prescribed for episodes of cellulitis. Patient denies chest pain, shortness breath, cough, nausea, vomiting, diarrhea, abdominal pain, symptoms of UTI or unilateral weakness.  ED Course: pt was found to have WBC 19.0, lactic acid 0.35, 4.14, 2.91, negative urinalysis, acute renal injury with creatinine 1.31, temperature 102.5, tachycardia, tachypnea, oxygen saturation 100% on room air. X-ray of the right leg showed cellulitis without evidence of osteomyelitis.  Review of Systems:   General: has fevers, chills, no body weight gain, has poor appetite, has fatigue HEENT: no blurry vision, hearing changes or sore throat Respiratory: no dyspnea, coughing, wheezing CV: no chest pain, no palpitations GI: no nausea, vomiting, abdominal pain, diarrhea, constipation GU: no dysuria, burning on urination, increased urinary frequency, hematuria  Ext: has leg edema Neuro: no unilateral weakness, numbness, or tingling, no vision change or hearing loss Skin: no rash, no skin tear. Right leg is erythematous MSK: No muscle spasm, no deformity, no limitation of range of movement in spin Heme: No easy bruising.  Travel history: No  recent long distant travel.  Allergy: No Known Allergies  Past Medical History:  Diagnosis Date  . Allergic drug rash   . Anal fissure    "lanced it w/my hemorrhoids"  . Cancer of skin of back    "don't know which kind"  . Cellulitis    recurrent cellulitis right foot-per patient-per patient in hospital for IV antibiotics 01/2012  . Cellulitis of right lower extremity hospitalized 12/21/2014  . Edema    LE, R>L since child hood  . Erythema nodosum   . Hemorrhoids   . Vasculitis of skin     Past Surgical History:  Procedure Laterality Date  . [place]   "had them lanced; also lanced anal fissure"  . HERNIA REPAIR    . PENIS REVASCULARIZATION SURGERY    . UMBILICAL HERNIA REPAIR  12/24/2007   with Proceed ventral    Social History:  reports that  has never smoked. he has never used smokeless tobacco. He reports that he drinks about 3.6 oz of alcohol per week. He reports that he uses drugs. Drug: Marijuana.  Family History:  Family History  Problem Relation Age of Onset  . Prostate cancer Father   . Heart disease Unknown        GRANDFATHER     Prior to Admission medications   Medication Sig Start Date End Date Taking? Authorizing Provider  furosemide (LASIX) 20 MG tablet Take once daily in the morning. Patient not taking: Reported on 01/14/2017 05/04/16   Hosie Poisson, MD    Physical Exam: Vitals:   01/15/17 0115 01/15/17 0130 01/15/17 0200 01/15/17 0215  BP:  122/67  101/64   Pulse: 100 (!) 105 (!) 103 92  Resp: (!) 21 (!) '24 17 18  ' Temp:      TempSrc:      SpO2: 100% 95% 95% 95%  Weight:      Height:       General: Not in acute distress HEENT:       Eyes: PERRL, EOMI, no scleral icterus.       ENT: No discharge from the ears and nose, no pharynx injection, no tonsillar enlargement.        Neck: No JVD, no bruit, no mass felt. Heme: No neck lymph node enlargement. Cardiac: S1/S2, RRR, No murmurs, No gallops or rubs. Respiratory: No rales,  wheezing, rhonchi or rubs. GI: Soft, nondistended, nontender, no rebound pain, no organomegaly, BS present. GU: No hematuria Ext: 2+DP/PT pulse bilaterally. Patient has a bilateral leg edema, the right leg is worse than the left. The right leg is erythematous, warm and tender.  Musculoskeletal: No joint deformities, No joint redness or warmth, no limitation of ROM in spin. Skin: No rashes.  Neuro: Alert, oriented X3, cranial nerves II-XII grossly intact, moves all extremities normally.  Psych: Patient is not psychotic, no suicidal or hemocidal ideation.  Labs on Admission: I have personally reviewed following labs and imaging studies  CBC: Recent Labs  Lab 01/14/17 2015  WBC 19.6*  NEUTROABS 17.4*  HGB 15.4  HCT 45.6  MCV 84.1  PLT 675   Basic Metabolic Panel: Recent Labs  Lab 01/14/17 2015  NA 137  K 3.8  CL 104  CO2 23  GLUCOSE 124*  BUN 12  CREATININE 1.31*  CALCIUM 9.2   GFR: Estimated Creatinine Clearance: 93.6 mL/min (A) (by C-G formula based on SCr of 1.31 mg/dL (H)). Liver Function Tests: Recent Labs  Lab 01/14/17 2015  AST 64*  ALT 131*  ALKPHOS 62  BILITOT 1.8*  PROT 6.8  ALBUMIN 4.0   No results for input(s): LIPASE, AMYLASE in the last 168 hours. No results for input(s): AMMONIA in the last 168 hours. Coagulation Profile: Recent Labs  Lab 01/14/17 2015  INR 1.02   Cardiac Enzymes: No results for input(s): CKTOTAL, CKMB, CKMBINDEX, TROPONINI in the last 168 hours. BNP (last 3 results) No results for input(s): PROBNP in the last 8760 hours. HbA1C: No results for input(s): HGBA1C in the last 72 hours. CBG: No results for input(s): GLUCAP in the last 168 hours. Lipid Profile: No results for input(s): CHOL, HDL, LDLCALC, TRIG, CHOLHDL, LDLDIRECT in the last 72 hours. Thyroid Function Tests: No results for input(s): TSH, T4TOTAL, FREET4, T3FREE, THYROIDAB in the last 72 hours. Anemia Panel: No results for input(s): VITAMINB12, FOLATE,  FERRITIN, TIBC, IRON, RETICCTPCT in the last 72 hours. Urine analysis:    Component Value Date/Time   COLORURINE YELLOW 01/14/2017 2015   APPEARANCEUR CLEAR 01/14/2017 2015   LABSPEC 1.012 01/14/2017 2015   PHURINE 5.0 01/14/2017 2015   GLUCOSEU NEGATIVE 01/14/2017 2015   HGBUR NEGATIVE 01/14/2017 2015   BILIRUBINUR NEGATIVE 01/14/2017 2015   KETONESUR NEGATIVE 01/14/2017 2015   PROTEINUR NEGATIVE 01/14/2017 2015   UROBILINOGEN 0.2 11/12/2010 0903   NITRITE NEGATIVE 01/14/2017 2015   LEUKOCYTESUR NEGATIVE 01/14/2017 2015   Sepsis Labs: '@LABRCNTIP' (procalcitonin:4,lacticidven:4) )No results found for this or any previous visit (from the past 240 hour(s)).   Radiological Exams on Admission: Dg Chest 2 View  Result Date: 01/14/2017 CLINICAL DATA:  52 year old male with fever. EXAM: CHEST  2 VIEW COMPARISON:  Chest radiograph dated 05/02/2016  FINDINGS: The heart size and mediastinal contours are within normal limits. Both lungs are clear. The visualized skeletal structures are unremarkable. IMPRESSION: No active cardiopulmonary disease. Electronically Signed   By: Anner Crete M.D.   On: 01/14/2017 21:16   Dg Tibia/fibula Right  Result Date: 01/14/2017 CLINICAL DATA:  Cellulitis of the right lower leg. EXAM: RIGHT TIBIA AND FIBULA - 2 VIEW COMPARISON:  12/24/2014 foot CT FINDINGS: There is subcutaneous edema throughout the visualized leg correlating with history of cellulitis. No soft tissue gas, opaque foreign body, or osseous erosion. IMPRESSION: Cellulitis without soft tissue gas or opaque foreign body. No evidence of osteomyelitis. Electronically Signed   By: Monte Fantasia M.D.   On: 01/14/2017 22:33     EKG:  Not done in ED  Assessment/Plan Principal Problem:   Cellulitis of right leg Active Problems:   CHRONIC Edema of right lower extremity   AKI (acute kidney injury) ([place])   Sepsis ([place])   Sepsis due to cellulitis of right leg: Patient meets criteria for sepsis with  leukocytosis, fever, tachycardia and tachypnea. Currently hemodynamically stable. Lactic acid is treating down with IV fluid.  - will place on med-surg bed for obs - Empiric antimicrobial treatment with vancomycin (pt received one dose of zosyn in ED) - PRN Zofran for nausea and Percocet for pain - Blood cultures x 2  - ESR and CRP - will get Procalcitonin and trend lactic acid levels per sepsis protocol. - IVF: 4.0 of NS bolus in ED, followed by 125 cc/h - LE doppler to r/o DVT  CHRONIC Edema of right lower extremity: -hold lasix due to AKI and sepsis  AKI: Likely due to prerenal secondary to dehydration diuretics. -  IVF as above - Check FeUrea - Follow up renal function by BMP - hold lasix  DVT ppx: SQ Lovenox Code Status: Full code Family Communication: None at bed side.    Disposition Plan:  Anticipate discharge back to previous home environment Consults called:  none Admission status: medical floor/obs       Date of Service 01/15/2017    Ivor Costa [facility] Pager [phone]  If 7PM-7AM, please contact night-coverage [email or web address] Password [email or web address] 01/15/2017, 2:51 AM

## 2017-01-15 NOTE — ED Notes (Signed)
A copy of lactic acid results given to RN Freida Busman due to Dr Johnney Killian being in another room

## 2017-01-15 NOTE — Progress Notes (Signed)
PROGRESS NOTE    Benjamin Mcdowell  HYW:[id number] DOB: 06/27/1965 DOA: 01/14/2017 PCP: Lucretia Kern, DO   Brief Narrative:  Patient is a 52 yo male with history of cellulitis of the right lower extremity and edema on bilateral lower extremities presented to ER on 01/14/2017 complaining of fever&chill and erythema, swelling, pain, and tightness on the right lower extremity that started 2 days ago and progressed rapidly last night.  Patient was admitted for cellulitis of right leg and started on IV Vancomycin.    Assessment & Plan:   Sepsis due to cellulitis of right leg - Marked the border of the erythema to monitor cellulitis progress - Continue IV Vancomycin  - Continue IVF - Started Diflucan due to fungal infection - Will order CBC to monitor WBC  CHRONIC Edema of right lower extremity - Hold Lasix due to AKI and sepsis  AKI - Continue IVF as above - Order BMP to follow renal function - Hold Lasix   DVT prophylaxis: Lovenox Code Status: Full Family Communication: None at bedside  Disposition Plan: 1-2 days   Consultants:   None  Procedures: Antimicrobials: IV Vancomycin PO Diflucan  Subjective:  Patient seen and examined.  Patient complains of pain started at right groin area and swelling, erythema rapidly progressed to the right leg, fever/chill and skin warm to touch.  Patient mentioned his past history of cellulitis only on the right leg that resolved from OTC medications except the last episode required IV antibiotic. Tightness and pain on the right leg.  Patient unaware of what caused the swelling and had tried taking some OTC medications but symptom unresolved  Objective: Vitals:   01/15/17 0215 01/15/17 0300 01/15/17 0352 01/15/17 0758  BP:  117/81 119/70 140/79  Pulse: 92 94 90 95  Resp: 18 17 16 17   Temp:   99.8 F (37.7 C) 99.1 F (37.3 C)  TempSrc:   Oral Oral  SpO2: 95% 92% 100%   Weight:      Height:        Intake/Output Summary (Last 24 hours)  at 01/15/2017 1407 Last data filed at 01/15/2017 1138 Gross per 24 hour  Intake 8750 ml  Output 1450 ml  Net 7300 ml   Filed Weights   01/14/17 1946  Weight: 131.5 kg (290 lb)    Examination:  General exam: Appears calm and comfortable  Respiratory system: Clear to auscultation. Respiratory effort normal. No wheezing or crackle Cardiovascular system: S1 & S2 heard, RRR. Edema on bilateral lower extremities. Central nervous system: Alert and oriented. No focal neurological deficits. Extremities:  Erythema, swelling, pain, and tightness on right leg.   Skin: Warm to touch overall. No lesion found on right leg     Data Reviewed: I have personally reviewed following labs and imaging studies  CBC: Recent Labs  Lab 01/14/17 2015 01/15/17 0301  WBC 19.6* 15.1*  NEUTROABS 17.4*  --   HGB 15.4 12.9*  HCT 45.6 39.3  MCV 84.1 84.5  PLT 191 485   Basic Metabolic Panel: Recent Labs  Lab 01/14/17 2015 01/15/17 0301  NA 137 141  K 3.8 4.4  CL 104 110  CO2 23 24  GLUCOSE 124* 150*  BUN 12 13  CREATININE 1.31* 1.27*  CALCIUM 9.2 7.9*   GFR: Estimated Creatinine Clearance: 96.6 mL/min (A) (by C-G formula based on SCr of 1.27 mg/dL (H)).  Liver Function Tests: Recent Labs  Lab 01/14/17 2015  AST 64*  ALT 131*  ALKPHOS 62  BILITOT  1.8*  PROT 6.8  ALBUMIN 4.0   No results for input(s): LIPASE, AMYLASE in the last 168 hours. No results for input(s): AMMONIA in the last 168 hours.  Coagulation Profile: Recent Labs  Lab 01/14/17 2015  INR 1.02   Cardiac Enzymes: No results for input(s): CKTOTAL, CKMB, CKMBINDEX, TROPONINI in the last 168 hours. BNP (last 3 results) No results for input(s): PROBNP in the last 8760 hours. HbA1C: No results for input(s): HGBA1C in the last 72 hours. CBG: No results for input(s): GLUCAP in the last 168 hours. Lipid Profile: No results for input(s): CHOL, HDL, LDLCALC, TRIG, CHOLHDL, LDLDIRECT in the last 72 hours. Thyroid  Function Tests: No results for input(s): TSH, T4TOTAL, FREET4, T3FREE, THYROIDAB in the last 72 hours. Anemia Panel: No results for input(s): VITAMINB12, FOLATE, FERRITIN, TIBC, IRON, RETICCTPCT in the last 72 hours. Sepsis Labs: Recent Labs  Lab 01/14/17 2150 01/14/17 2355 01/15/17 0300 01/15/17 0536  LATICACIDVEN 4.14* 2.91* 1.8 2.7*    No results found for this or any previous visit (from the past 240 hour(s)).       Radiology Studies: Dg Chest 2 View  Result Date: 01/14/2017 CLINICAL DATA:  52 year old male with fever. EXAM: CHEST  2 VIEW COMPARISON:  Chest radiograph dated 05/02/2016 FINDINGS: The heart size and mediastinal contours are within normal limits. Both lungs are clear. The visualized skeletal structures are unremarkable. IMPRESSION: No active cardiopulmonary disease. Electronically Signed   By: Anner Crete M.D.   On: 01/14/2017 21:16   Dg Tibia/fibula Right  Result Date: 01/14/2017 CLINICAL DATA:  Cellulitis of the right lower leg. EXAM: RIGHT TIBIA AND FIBULA - 2 VIEW COMPARISON:  12/24/2014 foot CT FINDINGS: There is subcutaneous edema throughout the visualized leg correlating with history of cellulitis. No soft tissue gas, opaque foreign body, or osseous erosion. IMPRESSION: Cellulitis without soft tissue gas or opaque foreign body. No evidence of osteomyelitis. Electronically Signed   By: Monte Fantasia M.D.   On: 01/14/2017 22:33        Scheduled Meds: . enoxaparin (LOVENOX) injection  60 mg Subcutaneous Q24H   Continuous Infusions: . sodium chloride 125 mL/hr at 01/15/17 0400  . vancomycin Stopped (01/15/17 1138)     LOS: 0 days    Jo-ku Elta [place]) Mervyn Skeeters, PA-S

## 2017-01-15 NOTE — Progress Notes (Signed)
VASCULAR LAB PRELIMINARY  PRELIMINARY  PRELIMINARY  PRELIMINARY  Bilateral lower extremity venous duplex completed.    Preliminary report:  There is no DVT or SVT noted in the bilateral lower extremities.  Enlarged inguinal lymph node noted on the right.   Dyana Magner, RVT 01/15/2017, 12:31 PM

## 2017-01-15 NOTE — Progress Notes (Signed)
CRITICAL VALUE ALERT  Critical Value:  Lactic acid 2.78  Date & Time Notied:  01/15/17 @ 7:30am  Provider Notified: Dr. Quincy Simmonds  Orders Received/Actions taken:

## 2017-01-16 LAB — CBC
HEMATOCRIT: 38.3 % — AB (ref 39.0–52.0)
Hemoglobin: 12.8 g/dL — ABNORMAL LOW (ref 13.0–17.0)
MCH: 28.2 pg (ref 26.0–34.0)
MCHC: 33.4 g/dL (ref 30.0–36.0)
MCV: 84.4 fL (ref 78.0–100.0)
Platelets: 131 10*3/uL — ABNORMAL LOW (ref 150–400)
RBC: 4.54 MIL/uL (ref 4.22–5.81)
RDW: 13.7 % (ref 11.5–15.5)
WBC: 8.9 10*3/uL (ref 4.0–10.5)

## 2017-01-16 LAB — UREA NITROGEN, URINE: Urea Nitrogen, Ur: 430 mg/dL

## 2017-01-16 LAB — BASIC METABOLIC PANEL
ANION GAP: 8 (ref 5–15)
BUN: 10 mg/dL (ref 6–20)
CALCIUM: 8.1 mg/dL — AB (ref 8.9–10.3)
CO2: 24 mmol/L (ref 22–32)
Chloride: 105 mmol/L (ref 101–111)
Creatinine, Ser: 1.2 mg/dL (ref 0.61–1.24)
GFR calc Af Amer: >60 mL/min (ref >60)
Glucose, Bld: 172 mg/dL — ABNORMAL HIGH (ref 65–99)
POTASSIUM: 3.5 mmol/L (ref 3.5–5.1)
SODIUM: 137 mmol/L (ref 135–145)

## 2017-01-16 NOTE — Progress Notes (Signed)
Pharmacy Antibiotic Note  Benjamin Mcdowell is a 52 y.o. male admitted on 01/14/2017 with cellulitis.  Pharmacy has been consulted for Vancomycin dosing.   Renal function improving, SCr 1.2 << 1.31, normalized CrCl~70-80 ml/min. Dose remains appropriate at this time  Plan: - Continue Vancomycin 1g IV every 12 hours - Fluconazole 200 mg po daily per MD - Consider transitioning to po antibiotics - Will continue to follow renal function, culture results, LOT, and antibiotic de-escalation plans    Height: 6' (182.9 cm) Weight: 290 lb (131.5 kg) IBW/kg (Calculated) : 77.6  Temp (24hrs), Avg:100.9 F (38.3 C), Min:99 F (37.2 C), Max:102.5 F (39.2 C)  Recent Labs  Lab 01/14/17 2015 01/14/17 2033 01/14/17 2150 01/14/17 2355 01/15/17 0300 01/15/17 0301 01/15/17 0536 01/16/17 0603  WBC 19.6*  --   --   --   --  15.1*  --  8.9  CREATININE 1.31*  --   --   --   --  1.27*  --  1.20  LATICACIDVEN  --  3.35* 4.14* 2.91* 1.8  --  2.7*  --     Estimated Creatinine Clearance: 102.2 mL/min (by C-G formula based on SCr of 1.2 mg/dL).    No Known Allergies  Vancomycin 1/9 >> Zosyn 1/9 x 1 Fluconazole 1/10 >>  1/9 BC x 2 >> ngtd  Thank you for allowing pharmacy to be a part of this patient's care.  Alycia Rossetti, PharmD, BCPS Clinical Pharmacist Pager: [phone] Clinical phone for 01/16/2017 from 7a-3:30p: [phone] If after 3:30p, please call main pharmacy at: [phone] 01/16/2017 9:15 AM

## 2017-01-16 NOTE — Progress Notes (Signed)
PROGRESS NOTE    Benjamin Mcdowell  BTD:[id number] DOB: 05-01-1965 DOA: 01/14/2017 PCP: Lucretia Kern, DO   Brief Narrative:  Patient is a 52 y/o male with history of cellulitis of the right lower extremity and edema on bilateral lower extremities presented to ER on 01/14/2017 complaining of fever&chill and erythema, swelling, pain, and tightness on the R LE that started 2 days ago and progressed rapidly last night.  Patient was admitted for cellulitis of R LE and started on IV Vancomycin.    Subjective: Patient seen and examined.  Patient complains of mild body ache this morning and pain, swelling, erythema remain unchanged.  Denies fever/chill, SOB, cough, palpitation.  WBC and creatine improved.    Assessment & Plan: Sepsis due to cellulitis of right leg - Erythema improved.  - Continue IV Vanc and monitor erythema  - Continue oral Diflucan due to fungal infection - Discontinue IVF, patient is ambulated and no diet/fluid restriction, advice patient to keep hydrated  CHRONIC Edema of right lower extremity - Hold Lasix due to AKI and sepsis  AKI - Creatine has returned within normal limit - Discontinue IVF  DVT prophylaxis: Lovenox Code Status: Full Family Communication: None at bedside  Disposition Plan: 1-2 days   Consultants:   None  Procedures: Antimicrobials: IV Vancomycin PO Diflucan     Objective: Vitals:   01/15/17 1543 01/15/17 2105 01/15/17 2333 01/16/17 0509  BP: 133/74 118/80  136/79  Pulse: (!) 103 98  98  Resp: 19 16  16   Temp: (!) 101.5 F (38.6 C) (!) 102.5 F (39.2 C) (!) 100.5 F (38.1 C) 99 F (37.2 C)  TempSrc: Oral Oral Oral Axillary  SpO2: 98% 97%  98%  Weight:      Height:        Intake/Output Summary (Last 24 hours) at 01/16/2017 1339 Last data filed at 01/16/2017 1057 Gross per 24 hour  Intake 2215 ml  Output 1650 ml  Net 565 ml   Filed Weights   01/14/17 1946  Weight: 131.5 kg (290 lb)    Examination:  General exam:  Appears calm and comfortable  Respiratory system: Clear to auscultation. Respiratory effort normal. No wheezing or crackle Cardiovascular system: S1 & S2 heard, RRR.   Central nervous system: Alert and oriented. No focal neurological deficits. Extremities: Erythema, swelling, pain, and tightness on right leg.  Skin: Warm to touch overall. No lesion found on right leg   Data Reviewed: I have personally reviewed following labs and imaging studies  CBC: Recent Labs  Lab 01/14/17 2015 01/15/17 0301 01/16/17 0603  WBC 19.6* 15.1* 8.9  NEUTROABS 17.4*  --   --   HGB 15.4 12.9* 12.8*  HCT 45.6 39.3 38.3*  MCV 84.1 84.5 84.4  PLT 191 155 106*   Basic Metabolic Panel: Recent Labs  Lab 01/14/17 2015 01/15/17 0301 01/16/17 0603  NA 137 141 137  K 3.8 4.4 3.5  CL 104 110 105  CO2 23 24 24   GLUCOSE 124* 150* 172*  BUN 12 13 10   CREATININE 1.31* 1.27* 1.20  CALCIUM 9.2 7.9* 8.1*   GFR: Estimated Creatinine Clearance: 102.2 mL/min (by C-G formula based on SCr of 1.2 mg/dL). Liver Function Tests: Recent Labs  Lab 01/14/17 2015  AST 64*  ALT 131*  ALKPHOS 62  BILITOT 1.8*  PROT 6.8  ALBUMIN 4.0   No results for input(s): LIPASE, AMYLASE in the last 168 hours. No results for input(s): AMMONIA in the last 168 hours. Coagulation Profile:  Recent Labs  Lab 01/14/17 2015  INR 1.02   Cardiac Enzymes: No results for input(s): CKTOTAL, CKMB, CKMBINDEX, TROPONINI in the last 168 hours. BNP (last 3 results) No results for input(s): PROBNP in the last 8760 hours. HbA1C: No results for input(s): HGBA1C in the last 72 hours. CBG: No results for input(s): GLUCAP in the last 168 hours. Lipid Profile: No results for input(s): CHOL, HDL, LDLCALC, TRIG, CHOLHDL, LDLDIRECT in the last 72 hours. Thyroid Function Tests: No results for input(s): TSH, T4TOTAL, FREET4, T3FREE, THYROIDAB in the last 72 hours. Anemia Panel: No results for input(s): VITAMINB12, FOLATE, FERRITIN, TIBC,  IRON, RETICCTPCT in the last 72 hours. Sepsis Labs: Recent Labs  Lab 01/14/17 2150 01/14/17 2355 01/15/17 0300 01/15/17 0536  LATICACIDVEN 4.14* 2.91* 1.8 2.7*    Recent Results (from the past 240 hour(s))  Culture, blood (Routine x 2)     Status: None (Preliminary result)   Collection Time: 01/14/17  8:00 PM  Result Value Ref Range Status   Specimen Description BLOOD RIGHT ARM  Final   Special Requests   Final    BOTTLES DRAWN AEROBIC AND ANAEROBIC Blood Culture adequate volume   Culture NO GROWTH < 24 HOURS  Final   Report Status PENDING  Incomplete  Culture, blood (Routine x 2)     Status: None (Preliminary result)   Collection Time: 01/14/17  8:15 PM  Result Value Ref Range Status   Specimen Description BLOOD RIGHT HAND  Final   Special Requests IN PEDIATRIC BOTTLE Blood Culture adequate volume  Final   Culture NO GROWTH < 24 HOURS  Final   Report Status PENDING  Incomplete         Radiology Studies: Dg Chest 2 View  Result Date: 01/14/2017 CLINICAL DATA:  52 year old male with fever. EXAM: CHEST  2 VIEW COMPARISON:  Chest radiograph dated 05/02/2016 FINDINGS: The heart size and mediastinal contours are within normal limits. Both lungs are clear. The visualized skeletal structures are unremarkable. IMPRESSION: No active cardiopulmonary disease. Electronically Signed   By: Anner Crete M.D.   On: 01/14/2017 21:16   Dg Tibia/fibula Right  Result Date: 01/14/2017 CLINICAL DATA:  Cellulitis of the right lower leg. EXAM: RIGHT TIBIA AND FIBULA - 2 VIEW COMPARISON:  12/24/2014 foot CT FINDINGS: There is subcutaneous edema throughout the visualized leg correlating with history of cellulitis. No soft tissue gas, opaque foreign body, or osseous erosion. IMPRESSION: Cellulitis without soft tissue gas or opaque foreign body. No evidence of osteomyelitis. Electronically Signed   By: Monte Fantasia M.D.   On: 01/14/2017 22:33    Scheduled Meds: . enoxaparin (LOVENOX) injection   60 mg Subcutaneous Q24H  . fluconazole  200 mg Oral Daily   Continuous Infusions: . sodium chloride 125 mL/hr at 01/15/17 0400  . vancomycin Stopped (01/16/17 1057)     LOS: 1 day    Jo-ku Elta [place]) Mervyn Skeeters, PA-S   Attending MD note  Patient was seen, examined,treatment plan was discussed with the PA-S.  I have personally reviewed the clinical findings, lab, imaging studies and management of this patient in detail. I agree with the documentation, as recorded by the PA-S  Patient is 52 y/o M with no sig medical hx presented to the ED with R LE cellulitis. Patient on IV Vanc. Superimposed fungal infection on diflucan. Feeling better today, infection improving.   On Exam: Blood pressure 136/79, pulse 98, temperature 99 F (37.2 C), temperature source Axillary, resp. rate 16, height 6' (1.829 m), weight  131.5 kg (290 lb), SpO2 98 %.  Gen. exam: NAD Chest: Good air entry bilaterally, no rhonchi or rales CVS: S1-S2 regular, no murmurs  Abdomen: Soft, nontender and nondistended Skin: R LE erythema up to mid calf, swelling and tender to palpation.    Plan Cellulitis  Continue Vancomycin for 24 hrs, plan to switch to doxy in AM if continues to improve Pain med PRN   Onychomycosis   Diflucan daily for 4 weeks  LFTs normal   Chronic Lymphoedema on the R leg  Holding lasix for now  Recommend outpatient lymphoedema therapy   AKI - resolve  D/c IVF   Rest as above  Chipper Oman, MD

## 2017-01-17 MED ORDER — DOXYCYCLINE HYCLATE 100 MG IV SOLR
100.0000 mg | Freq: Two times a day (BID) | INTRAVENOUS | Status: DC
  Administered 2017-01-17 (×2): 100 mg via INTRAVENOUS
  Filled 2017-01-17 (×3): qty 100

## 2017-01-17 NOTE — Progress Notes (Addendum)
PROGRESS NOTE [facility]   Anil Havard   RSW:[id number] DOB: January 17, 1965  DOA: 01/14/2017 PCP: Lucretia Kern, DO   Brief Narrative:  Benjamin Mcdowell  is a 52 y/o male with history of cellulitis of the right lower extremity and edema on bilateral lower extremities presented to ER on 01/14/2017 complaining of fever&chill and erythema, swelling, pain, and tightness on the R LE and progressed rapidly. Patient was admitted for cellulitis of R LE and started on IV Vancomycin. During hospital stay spike fever but clinically improving.  Subjective: Seen and examined, erythema has improved but localized at the mid shin. Pain decrease and swelling slight improved. No other concerns. No acute events overnight.   Assessment & Plan: Cellulitis with superimposed onychomycosis  Treated w/ Vancomycin for 48 hrs, switched to doxy IV, requires at least 24 hrs more of IV abx, if sig improvement in AM will consider switching to PO abx  Pain med PRN   Onychomycosis   Diflucan daily for 4 weeks  LFTs normal   Chronic Lymphoedema on the R leg  Continue to hold lasix for now  Recommend outpatient lymphoedema therapy   AKI - resolve  Check CMP in AM   DVT prophylaxis:Lovenox Code Status:Full Family Communication:None at bedside Disposition Plan:1-2 days  Consultants:   None   Procedures:   None  Antimicrobials: Anti-infectives (From admission, onward)   Start     Dose/Rate Route Frequency Ordered Stop   01/17/17 0900  doxycycline (VIBRAMYCIN) 100 mg in dextrose 5 % 250 mL IVPB     100 mg 125 mL/hr over 120 Minutes Intravenous Every 12 hours 01/17/17 0754     01/15/17 1645  fluconazole (DIFLUCAN) tablet 200 mg     200 mg Oral Daily 01/15/17 1610     01/15/17 1030  vancomycin (VANCOCIN) IVPB 1000 mg/200 mL premix  Status:  Discontinued     1,000 mg 200 mL/hr over 60 Minutes Intravenous Every 12 hours 01/14/17 2149 01/17/17 0754   01/15/17 0600  piperacillin-tazobactam (ZOSYN)  IVPB 3.375 g  Status:  Discontinued     3.375 g 12.5 mL/hr over 240 Minutes Intravenous Every 8 hours 01/14/17 2149 01/15/17 0230   01/14/17 2200  vancomycin (VANCOCIN) 2,500 mg in sodium chloride 0.9 % 500 mL IVPB     2,500 mg 250 mL/hr over 120 Minutes Intravenous  Once 01/14/17 2149 01/15/17 0200   01/14/17 2145  cefTRIAXone (ROCEPHIN) 2 g in dextrose 5 % 50 mL IVPB  Status:  Discontinued     2 g 100 mL/hr over 30 Minutes Intravenous  Once 01/14/17 2139 01/14/17 2141   01/14/17 2145  piperacillin-tazobactam (ZOSYN) IVPB 3.375 g     3.375 g 100 mL/hr over 30 Minutes Intravenous  Once 01/14/17 2141 01/14/17 2221   01/14/17 2145  vancomycin (VANCOCIN) IVPB 1000 mg/200 mL premix  Status:  Discontinued     1,000 mg 200 mL/hr over 60 Minutes Intravenous  Once 01/14/17 2141 01/14/17 2149          Objective: Vitals:   01/16/17 1559 01/16/17 2035 01/17/17 0542 01/17/17 1354  BP: (!) 157/92 (!) 145/83 (!) 127/59 132/75  Pulse: 98 96 86 87  Resp: 16   16  Temp: 100.2 F (37.9 C) 100.2 F (37.9 C) (!) 101 F (38.3 C) (!) 101.9 F (38.8 C)  TempSrc: Oral Oral Oral Oral  SpO2: 99% 95% 97% 98%  Weight:      Height:        Intake/Output Summary (Last  24 hours) at 01/17/2017 1651 Last data filed at 01/17/2017 1158 Gross per 24 hour  Intake 250 ml  Output -  Net 250 ml   Filed Weights   01/14/17 1946  Weight: 131.5 kg (290 lb)    Examination:  General: Pt is alert, awake, not in acute distress Cardiovascular: RRR, S1/S2 +, no rubs, no gallops Respiratory: CTA bilaterally, no wheezing, no rhonchi Abdominal: Soft, NT, ND, bowel sounds + Extremities: R LE swollen, erythema improving, non tender    Data Reviewed: I have personally reviewed following labs and imaging studies  CBC: Recent Labs  Lab 01/14/17 2015 01/15/17 0301 01/16/17 0603  WBC 19.6* 15.1* 8.9  NEUTROABS 17.4*  --   --   HGB 15.4 12.9* 12.8*  HCT 45.6 39.3 38.3*  MCV 84.1 84.5 84.4  PLT 191 155 131*     Basic Metabolic Panel: Recent Labs  Lab 01/14/17 2015 01/15/17 0301 01/16/17 0603  NA 137 141 137  K 3.8 4.4 3.5  CL 104 110 105  CO2 23 24 24   GLUCOSE 124* 150* 172*  BUN 12 13 10   CREATININE 1.31* 1.27* 1.20  CALCIUM 9.2 7.9* 8.1*   GFR: Estimated Creatinine Clearance: 102.2 mL/min (by C-G formula based on SCr of 1.2 mg/dL). Liver Function Tests: Recent Labs  Lab 01/14/17 2015  AST 64*  ALT 131*  ALKPHOS 62  BILITOT 1.8*  PROT 6.8  ALBUMIN 4.0   No results for input(s): LIPASE, AMYLASE in the last 168 hours. No results for input(s): AMMONIA in the last 168 hours. Coagulation Profile: Recent Labs  Lab 01/14/17 2015  INR 1.02   Cardiac Enzymes: No results for input(s): CKTOTAL, CKMB, CKMBINDEX, TROPONINI in the last 168 hours. BNP (last 3 results) No results for input(s): PROBNP in the last 8760 hours. HbA1C: No results for input(s): HGBA1C in the last 72 hours. CBG: No results for input(s): GLUCAP in the last 168 hours. Lipid Profile: No results for input(s): CHOL, HDL, LDLCALC, TRIG, CHOLHDL, LDLDIRECT in the last 72 hours. Thyroid Function Tests: No results for input(s): TSH, T4TOTAL, FREET4, T3FREE, THYROIDAB in the last 72 hours. Anemia Panel: No results for input(s): VITAMINB12, FOLATE, FERRITIN, TIBC, IRON, RETICCTPCT in the last 72 hours. Sepsis Labs: Recent Labs  Lab 01/14/17 2150 01/14/17 2355 01/15/17 0300 01/15/17 0536  LATICACIDVEN 4.14* 2.91* 1.8 2.7*    Recent Results (from the past 240 hour(s))  Culture, blood (Routine x 2)     Status: None (Preliminary result)   Collection Time: 01/14/17  8:00 PM  Result Value Ref Range Status   Specimen Description BLOOD RIGHT ARM  Final   Special Requests   Final    BOTTLES DRAWN AEROBIC AND ANAEROBIC Blood Culture adequate volume   Culture NO GROWTH 3 DAYS  Final   Report Status PENDING  Incomplete  Culture, blood (Routine x 2)     Status: None (Preliminary result)   Collection Time:  01/14/17  8:15 PM  Result Value Ref Range Status   Specimen Description BLOOD RIGHT HAND  Final   Special Requests IN PEDIATRIC BOTTLE Blood Culture adequate volume  Final   Culture NO GROWTH 3 DAYS  Final   Report Status PENDING  Incomplete      Radiology Studies: No results found.    Scheduled Meds: . enoxaparin (LOVENOX) injection  60 mg Subcutaneous Q24H  . fluconazole  200 mg Oral Daily   Continuous Infusions: . doxycycline (VIBRAMYCIN) IV Stopped (01/17/17 1158)     LOS:  2 days    Time spent: Total of 15 minutes spent with pt, greater than 50% of which was spent in discussion of  treatment, counseling and coordination of care   Chipper Oman, MD Pager: Text Page via [email or web address]   If 7PM-7AM, please contact night-coverage [email or web address] 01/17/2017, 4:51 PM

## 2017-01-18 LAB — BASIC METABOLIC PANEL
ANION GAP: 10 (ref 5–15)
BUN: 8 mg/dL (ref 6–20)
CHLORIDE: 107 mmol/L (ref 101–111)
CO2: 24 mmol/L (ref 22–32)
Calcium: 8.3 mg/dL — ABNORMAL LOW (ref 8.9–10.3)
Creatinine, Ser: 0.97 mg/dL (ref 0.61–1.24)
GFR calc Af Amer: >60 mL/min (ref >60)
GFR calc non Af Amer: >60 mL/min (ref >60)
GLUCOSE: 126 mg/dL — AB (ref 65–99)
POTASSIUM: 3.4 mmol/L — AB (ref 3.5–5.1)
Sodium: 141 mmol/L (ref 135–145)

## 2017-01-18 LAB — CBC
HEMATOCRIT: 36.4 % — AB (ref 39.0–52.0)
HEMOGLOBIN: 12.5 g/dL — AB (ref 13.0–17.0)
MCH: 28.4 pg (ref 26.0–34.0)
MCHC: 34.3 g/dL (ref 30.0–36.0)
MCV: 82.7 fL (ref 78.0–100.0)
Platelets: 177 10*3/uL (ref 150–400)
RBC: 4.4 MIL/uL (ref 4.22–5.81)
RDW: 13.2 % (ref 11.5–15.5)
WBC: 6.6 10*3/uL (ref 4.0–10.5)

## 2017-01-18 MED ORDER — PIPERACILLIN-TAZOBACTAM 3.375 G IVPB
3.3750 g | Freq: Three times a day (TID) | INTRAVENOUS | Status: DC
  Administered 2017-01-18 – 2017-01-19 (×3): 3.375 g via INTRAVENOUS
  Filled 2017-01-18 (×4): qty 50

## 2017-01-18 MED ORDER — PREDNISONE 20 MG PO TABS
60.0000 mg | ORAL_TABLET | Freq: Every day | ORAL | Status: DC
  Administered 2017-01-18 – 2017-01-19 (×2): 60 mg via ORAL
  Filled 2017-01-18 (×2): qty 3

## 2017-01-18 MED ORDER — PREDNISONE 20 MG PO TABS: 60.0000 mg | ORAL_TABLET | Freq: Every day | ORAL | Status: DC

## 2017-01-18 MED ORDER — POTASSIUM CHLORIDE CRYS ER 20 MEQ PO TBCR
40.0000 meq | EXTENDED_RELEASE_TABLET | ORAL | Status: AC
  Administered 2017-01-18 (×2): 40 meq via ORAL
  Filled 2017-01-18 (×2): qty 2

## 2017-01-18 MED ORDER — DOXYCYCLINE HYCLATE 100 MG PO TABS: 100.0000 mg | ORAL_TABLET | Freq: Two times a day (BID) | ORAL | Status: DC

## 2017-01-18 MED ORDER — DOXYCYCLINE HYCLATE 100 MG IV SOLR
100.0000 mg | Freq: Two times a day (BID) | INTRAVENOUS | Status: AC
  Administered 2017-01-18: 100 mg via INTRAVENOUS
  Filled 2017-01-18: qty 100

## 2017-01-18 NOTE — Progress Notes (Signed)
ANTIBIOTIC CONSULT NOTE - INITIAL  Pharmacy Consult for Zosyn Indication: cellulitis  No Known Allergies  Patient Measurements: Height: 6' (182.9 cm) Weight: 290 lb (131.5 kg) IBW/kg (Calculated) : 77.6 Adjusted Body Weight:    Vital Signs: Temp: 98.9 F (37.2 C) (01/13 0646) Temp Source: Oral (01/13 0646) BP: 143/89 (01/13 0646) Pulse Rate: 79 (01/13 0646) Intake/Output from previous day: 01/12 0701 - 01/13 0700 In: 250 [IV Piggyback:250] Out: -  Intake/Output from this shift: Total I/O In: 240 [P.O.:240] Out: 700 [Urine:700]  Labs: Recent Labs    01/16/17 0603 01/18/17 0613  WBC 8.9 6.6  HGB 12.8* 12.5*  PLT 131* 177  CREATININE 1.20 0.97   Estimated Creatinine Clearance: 126.4 mL/min (by C-G formula based on SCr of 0.97 mg/dL). No results for input(s): VANCOTROUGH, VANCOPEAK, VANCORANDOM, GENTTROUGH, GENTPEAK, GENTRANDOM, TOBRATROUGH, TOBRAPEAK, TOBRARND, AMIKACINPEAK, AMIKACINTROU, AMIKACIN in the last 72 hours.   Microbiology:   Medical History: Past Medical History:  Diagnosis Date  . Allergic drug rash   . Anal fissure    "lanced it w/my hemorrhoids"  . Cancer of skin of back    "don't know which kind"  . Cellulitis    recurrent cellulitis right foot-per patient-per patient in hospital for IV antibiotics 01/2012  . Cellulitis of right lower extremity hospitalized 12/21/2014  . Edema    LE, R>L since child hood  . Erythema nodosum   . Hemorrhoids   . Vasculitis of skin     Assessment: ID: RLE cellulitis and concern for superimposed fungal infxn. Tmax/24h: 101.9, WBC 15.1>8.9>6.6 now, SCr 0.97, CrCl>100  Vancomycin 1/9 >>1/12 Zosyn 1/9 x 1, 1/13>> Fluconazole 1/10 >> Doxy 1/12>>  1/9 BC x 2 >> ngtd  Goal of Therapy:  Eradication of infection  Plan:  Fluconazole 200 mg po daily per MD IV Doxy 100mg  q 12h Add Zosyn 3.375g IV q 8hr  Benjamin Mcdowell, PharmD, BCPS Clinical Staff Pharmacist Pager [phone]  Benjamin Ghazi  Mcdowell 01/18/2017,11:33 AM

## 2017-01-18 NOTE — Progress Notes (Signed)
PROGRESS NOTE [facility]   Detrell Umscheid   SAY:[id number] DOB: 1965-06-13  DOA: 01/14/2017 PCP: Lucretia Kern, DO   Brief Narrative:  Benjamin Mcdowell  is a 52 y/o male with history of cellulitis of the right lower extremity and edema on bilateral lower extremities presented to ER on 01/14/2017 complaining of fever&chill and erythema, swelling, pain, and tightness on the R LE and progressed rapidly. Patient was admitted for cellulitis of R LE and started on IV Vancomycin. During hospital stay spike fever but clinically improving.  Subjective: Patient seen and examined, erythema unchanged from yesterday. Swelling and pain has decrease. Afebrile over 24 hrs.   Chart was reviewed in summary patient with multiple episodes of cellulitis with suspected vasculitis in the past, no diagnostic biopsy was done. Followed by Dr Quenten Raven as outpatient   Assessment & Plan: Cellulitis with superimposed onychomycosis  Treated w/ Vancomycin for 48 hrs, switched to doxy IV for 1 day, now placed on Zosyn  After reviewing chart, patient had a complicated episode of cellulitis back to 2016, patient was treated with zosyn with good response. At that time felt to be related to vasculitis and was also treated with steroids. Given patient response in 2016 will treat with Zosyn, add prednisone 60 mg daily. If no improvement will consider ID consult.   Onychomycosis   Diflucan daily for 4 weeks  LFTs normal   Chronic Lymphoedema on the R leg  Resume home dose Lasix   Recommend outpatient lymphoedema therapy   AKI - resolve   DVT prophylaxis:Lovenox Code Status:Full Family Communication:None at bedside Disposition Plan:1-2 days  Consultants:   None   Procedures:   None  Antimicrobials: Anti-infectives (From admission, onward)   Start     Dose/Rate Route Frequency Ordered Stop   01/18/17 2000  doxycycline (VIBRA-TABS) tablet 100 mg  Status:  Discontinued     100 mg Oral Every 12 hours  01/18/17 0739 01/18/17 1122   01/18/17 1230  piperacillin-tazobactam (ZOSYN) IVPB 3.375 g     3.375 g 12.5 mL/hr over 240 Minutes Intravenous Every 8 hours 01/18/17 1132     01/18/17 0900  doxycycline (VIBRAMYCIN) 100 mg in dextrose 5 % 250 mL IVPB     100 mg 125 mL/hr over 120 Minutes Intravenous Every 12 hours 01/18/17 0739 01/18/17 1228   01/17/17 0900  doxycycline (VIBRAMYCIN) 100 mg in dextrose 5 % 250 mL IVPB  Status:  Discontinued     100 mg 125 mL/hr over 120 Minutes Intravenous Every 12 hours 01/17/17 0754 01/18/17 0739   01/15/17 1645  fluconazole (DIFLUCAN) tablet 200 mg     200 mg Oral Daily 01/15/17 1610     01/15/17 1030  vancomycin (VANCOCIN) IVPB 1000 mg/200 mL premix  Status:  Discontinued     1,000 mg 200 mL/hr over 60 Minutes Intravenous Every 12 hours 01/14/17 2149 01/17/17 0754   01/15/17 0600  piperacillin-tazobactam (ZOSYN) IVPB 3.375 g  Status:  Discontinued     3.375 g 12.5 mL/hr over 240 Minutes Intravenous Every 8 hours 01/14/17 2149 01/15/17 0230   01/14/17 2200  vancomycin (VANCOCIN) 2,500 mg in sodium chloride 0.9 % 500 mL IVPB     2,500 mg 250 mL/hr over 120 Minutes Intravenous  Once 01/14/17 2149 01/15/17 0200   01/14/17 2145  cefTRIAXone (ROCEPHIN) 2 g in dextrose 5 % 50 mL IVPB  Status:  Discontinued     2 g 100 mL/hr over 30 Minutes Intravenous  Once 01/14/17 2139 01/14/17 2141  01/14/17 2145  piperacillin-tazobactam (ZOSYN) IVPB 3.375 g     3.375 g 100 mL/hr over 30 Minutes Intravenous  Once 01/14/17 2141 01/14/17 2221   01/14/17 2145  vancomycin (VANCOCIN) IVPB 1000 mg/200 mL premix  Status:  Discontinued     1,000 mg 200 mL/hr over 60 Minutes Intravenous  Once 01/14/17 2141 01/14/17 2149         Objective: Vitals:   01/17/17 1354 01/17/17 2154 01/18/17 0646 01/18/17 1335  BP: 132/75 (!) 144/76 (!) 143/89 (!) 151/90  Pulse: 87 89 79 80  Resp: 16 16 16 18   Temp: (!) 101.9 F (38.8 C) 100.2 F (37.9 C) 98.9 F (37.2 C) 99.3 F (37.4 C)    TempSrc: Oral Oral Oral Oral  SpO2: 98% 97% 97% 98%  Weight:      Height:        Intake/Output Summary (Last 24 hours) at 01/18/2017 1359 Last data filed at 01/18/2017 1241 Gross per 24 hour  Intake 480 ml  Output 1700 ml  Net -1220 ml   Filed Weights   01/14/17 1946  Weight: 131.5 kg (290 lb)    Examination:  General: Pt is alert, awake, not in acute distress Cardiovascular: RRR, S1/S2 +, no rubs, no gallops Respiratory: CTA bilaterally, no wheezing, no rhonchi Abdominal: Soft, NT, ND, bowel sounds + Extremities: No change in erythema, swelling have decrease.    Data Reviewed: I have personally reviewed following labs and imaging studies  CBC: Recent Labs  Lab 01/14/17 2015 01/15/17 0301 01/16/17 0603 01/18/17 0613  WBC 19.6* 15.1* 8.9 6.6  NEUTROABS 17.4*  --   --   --   HGB 15.4 12.9* 12.8* 12.5*  HCT 45.6 39.3 38.3* 36.4*  MCV 84.1 84.5 84.4 82.7  PLT 191 155 131* 175   Basic Metabolic Panel: Recent Labs  Lab 01/14/17 2015 01/15/17 0301 01/16/17 0603 01/18/17 0613  NA 137 141 137 141  K 3.8 4.4 3.5 3.4*  CL 104 110 105 107  CO2 23 24 24 24   GLUCOSE 124* 150* 172* 126*  BUN 12 13 10 8   CREATININE 1.31* 1.27* 1.20 0.97  CALCIUM 9.2 7.9* 8.1* 8.3*   GFR: Estimated Creatinine Clearance: 126.4 mL/min (by C-G formula based on SCr of 0.97 mg/dL). Liver Function Tests: Recent Labs  Lab 01/14/17 2015  AST 64*  ALT 131*  ALKPHOS 62  BILITOT 1.8*  PROT 6.8  ALBUMIN 4.0   No results for input(s): LIPASE, AMYLASE in the last 168 hours. No results for input(s): AMMONIA in the last 168 hours. Coagulation Profile: Recent Labs  Lab 01/14/17 2015  INR 1.02   Cardiac Enzymes: No results for input(s): CKTOTAL, CKMB, CKMBINDEX, TROPONINI in the last 168 hours. BNP (last 3 results) No results for input(s): PROBNP in the last 8760 hours. HbA1C: No results for input(s): HGBA1C in the last 72 hours. CBG: No results for input(s): GLUCAP in the last 168  hours. Lipid Profile: No results for input(s): CHOL, HDL, LDLCALC, TRIG, CHOLHDL, LDLDIRECT in the last 72 hours. Thyroid Function Tests: No results for input(s): TSH, T4TOTAL, FREET4, T3FREE, THYROIDAB in the last 72 hours. Anemia Panel: No results for input(s): VITAMINB12, FOLATE, FERRITIN, TIBC, IRON, RETICCTPCT in the last 72 hours. Sepsis Labs: Recent Labs  Lab 01/14/17 2150 01/14/17 2355 01/15/17 0300 01/15/17 0536  LATICACIDVEN 4.14* 2.91* 1.8 2.7*    Recent Results (from the past 240 hour(s))  Culture, blood (Routine x 2)     Status: None (Preliminary result)  Collection Time: 01/14/17  8:00 PM  Result Value Ref Range Status   Specimen Description BLOOD RIGHT ARM  Final   Special Requests   Final    BOTTLES DRAWN AEROBIC AND ANAEROBIC Blood Culture adequate volume   Culture NO GROWTH 4 DAYS  Final   Report Status PENDING  Incomplete  Culture, blood (Routine x 2)     Status: None (Preliminary result)   Collection Time: 01/14/17  8:15 PM  Result Value Ref Range Status   Specimen Description BLOOD RIGHT HAND  Final   Special Requests IN PEDIATRIC BOTTLE Blood Culture adequate volume  Final   Culture NO GROWTH 4 DAYS  Final   Report Status PENDING  Incomplete      Radiology Studies: No results found.    Scheduled Meds: . enoxaparin (LOVENOX) injection  60 mg Subcutaneous Q24H  . fluconazole  200 mg Oral Daily  . [START ON 01/19/2017] predniSONE  60 mg Oral Q breakfast   Continuous Infusions: . piperacillin-tazobactam (ZOSYN)  IV 3.375 g (01/18/17 1236)     LOS: 3 days    Time spent: Total of 25 minutes spent with pt, greater than 50% of which was spent in discussion of  treatment, counseling and coordination of care   Chipper Oman, MD Pager: Text Page via [email or web address]   If 7PM-7AM, please contact night-coverage [email or web address] 01/18/2017, 1:59 PM

## 2017-01-19 LAB — CULTURE, BLOOD (ROUTINE X 2)
CULTURE: NO GROWTH
Culture: NO GROWTH
SPECIAL REQUESTS: ADEQUATE
SPECIAL REQUESTS: ADEQUATE

## 2017-01-19 MED ORDER — FLUCONAZOLE 200 MG PO TABS: 200.0000 mg | ORAL_TABLET | Freq: Every day | ORAL | 0 refills | Status: DC

## 2017-01-19 MED ORDER — PREDNISONE 20 MG PO TABS: 40.0000 mg | ORAL_TABLET | Freq: Every day | ORAL | 0 refills | Status: AC

## 2017-01-19 MED ORDER — FUROSEMIDE 20 MG PO TABS: 20.0000 mg | ORAL_TABLET | Freq: Every day | ORAL | 0 refills | Status: DC | PRN

## 2017-01-19 MED ORDER — LINEZOLID 600 MG PO TABS: 600.0000 mg | ORAL_TABLET | Freq: Two times a day (BID) | ORAL | 0 refills | Status: AC

## 2017-01-19 NOTE — Discharge Summary (Signed)
Physician Discharge Summary  Benjamin Mcdowell  EQA:[id number]  DOB: September 26, 1965  DOA: 01/14/2017 PCP: Benjamin Kern, DO  Admit date: 01/14/2017 Discharge date: 01/19/2017  Admitted From: Home  Disposition:  Home   Recommendations for Outpatient Follow-up:  1. Follow up with PCP in 1-2 weeks 2. Please obtain BMP/CBC in one week 3. Please follow up on the following pending results: Final blood cultures so far no growth 4. Complete Zyvox for 2 weeks, also to complete steroids for 4 more days 5. Consider dermatology referral 6. Check LFT's in 4 weeks  7. Follow-up with infectious disease Dr Tommy Medal   Discharge Condition: Stable CODE STATUS: Full code Diet recommendation: Heart Healthy  Brief/Interim Summary: For full details see H&P/progress note but in brief, Benjamin Mcdowell  is a 52 y/o male with history of cellulitis of the right lower extremity and edema on bilateral lower extremities presented to ER on 01/14/2017 complaining of fever&chill, erythema, swelling, pain, and tightness on theR LE than progressed rapidly. Patient was admitted for cellulitis ofR LEand started on IV Vancomycin.  Antibiotics were changed to IV doxycycline and subsequently to Zosyn, upon clinical improvement antibiotic were changed to oral Zyvox.  Blood cultures were negative.   Subjective: Patient seen and examined, report some improvement.  Denies pain.  Patient able to ambulate without difficulty.  Remains afebrile for over 36 hours.  No acute events overnight.  Discharge Diagnoses/Hospital Course:  Cellulitis with superimposed onychomycosis  Treated w/ Vancomycin for 48 hrs, switched to doxy IV for 1 day, then switched to Zosyn.  Cellulitis back to 2016, patient was treated with zosyn with good response. At that time felt to be related to vasculitis and was also treated with steroids. Patient again responded well to zosyn, at that time patient was placed on Zyvox for 2 weeks, therefore will discharge on Zyvox 600 mg  BID. Patient was also started on steroid will treat for 5 days. Patient has been advised to follow up with dermatology as previous biopsy shoed erythema nodosum. At this time patient felt that this was provoke by a trauma that he had on his R pinky toe. Venous doppler negative for DVT Follow up with PCP and ID doctor advised   Onychomycosis  Diflucan daily for 4 weeks  LFTs normal   Chronic Lymphoedema on the R leg  Resume home dose Lasix   Recommend outpatient lymphoedema therapy   AKI- resolve  Treated with IVF   All other chronic medical condition were stable during the hospitalization.  On the day of the discharge the patient's vitals were stable, and no other acute medical condition were reported by patient. the patient was felt safe to be discharge to home   Discharge Instructions  You were cared for by a hospitalist during your hospital stay. If you have any questions about your discharge medications or the care you received while you were in the hospital after you are discharged, you can call the unit and asked to speak with the hospitalist on call if the hospitalist that took care of you is not available. Once you are discharged, your primary care physician will handle any further medical issues. Please note that NO REFILLS for any discharge medications will be authorized once you are discharged, as it is imperative that you return to your primary care physician (or establish a relationship with a primary care physician if you do not have one) for your aftercare needs so that they can reassess your need for medications and monitor  your lab values.  Discharge Instructions    Call MD for:  difficulty breathing, headache or visual disturbances   Complete by:  As directed    Call MD for:  extreme fatigue   Complete by:  As directed    Call MD for:  hives   Complete by:  As directed    Call MD for:  persistant dizziness or light-headedness   Complete by:  As directed    Call MD  for:  persistant nausea and vomiting   Complete by:  As directed    Call MD for:  redness, tenderness, or signs of infection (pain, swelling, redness, odor or green/yellow discharge around incision site)   Complete by:  As directed    Call MD for:  severe uncontrolled pain   Complete by:  As directed    Call MD for:  temperature >100.4   Complete by:  As directed    Diet - low sodium heart healthy   Complete by:  As directed    Increase activity slowly   Complete by:  As directed      Allergies as of 01/19/2017   No Known Allergies     Medication List    TAKE these medications   fluconazole 200 MG tablet Commonly known as:  DIFLUCAN Take 1 tablet (200 mg total) by mouth daily. Start taking on:  01/20/2017   furosemide 20 MG tablet Commonly known as:  LASIX Take 1 tablet (20 mg total) by mouth daily as needed for edema. Take once daily in the morning. What changed:    how much to take  how to take this  when to take this  reasons to take this   linezolid 600 MG tablet Commonly known as:  ZYVOX Take 1 tablet (600 mg total) by mouth 2 (two) times daily for 14 days.   predniSONE 20 MG tablet Commonly known as:  DELTASONE Take 2 tablets (40 mg total) by mouth daily with breakfast for 4 days. Start taking on:  01/20/2017      Follow-up Information    Benjamin Kern, DO. Schedule an appointment as soon as possible for a visit in 1 week(s).   Specialty:  Family Medicine Why:  Hospital follow up  Contact information: [place] [address] [phone]        Tommy Medal, Lavell Islam, MD. Schedule an appointment as soon as possible for a visit in 2 week(s).   Specialty:  Infectious Diseases Why:  Hospitall follow up  Contact information: [address] [address] [phone]        [facility]. Call.   Why:  Ask for Lymphedema therapy [phone]         No Known  Allergies  Consultations:  None    Procedures/Studies: Dg Chest 2 View  Result Date: 01/14/2017 CLINICAL DATA:  52 year old male with fever. EXAM: CHEST  2 VIEW COMPARISON:  Chest radiograph dated 05/02/2016 FINDINGS: The heart size and mediastinal contours are within normal limits. Both lungs are clear. The visualized skeletal structures are unremarkable. IMPRESSION: No active cardiopulmonary disease. Electronically Signed   By: Anner Crete M.D.   On: 01/14/2017 21:16   Dg Tibia/fibula Right  Result Date: 01/14/2017 CLINICAL DATA:  Cellulitis of the right lower leg. EXAM: RIGHT TIBIA AND FIBULA - 2 VIEW COMPARISON:  12/24/2014 foot CT FINDINGS: There is subcutaneous edema throughout the visualized leg correlating with history of cellulitis. No soft tissue gas,  opaque foreign body, or osseous erosion. IMPRESSION: Cellulitis without soft tissue gas or opaque foreign body. No evidence of osteomyelitis. Electronically Signed   By: Monte Fantasia M.D.   On: 01/14/2017 22:33    Discharge Exam: Vitals:   01/18/17 2111 01/19/17 0548  BP: (!) 141/90 131/83  Pulse: 78 67  Resp: 18 16  Temp: 98.9 F (37.2 C) 97.6 F (36.4 C)  SpO2: 97% 99%   Vitals:   01/18/17 0646 01/18/17 1335 01/18/17 2111 01/19/17 0548  BP: (!) 143/89 (!) 151/90 (!) 141/90 131/83  Pulse: 79 80 78 67  Resp: 16 18 18 16   Temp: 98.9 F (37.2 C) 99.3 F (37.4 C) 98.9 F (37.2 C) 97.6 F (36.4 C)  TempSrc: Oral Oral Oral Oral  SpO2: 97% 98% 97% 99%  Weight:      Height:        General: Pt is alert, awake, not in acute distress Cardiovascular: RRR, S1/S2 +, no rubs, no gallops Respiratory: CTA bilaterally, no wheezing, no rhonchi Abdominal: Soft, NT, ND, bowel sounds + Extremities:     The results of significant diagnostics from this hospitalization (including imaging, microbiology, ancillary and laboratory) are listed below for reference.     Microbiology: Recent Results (from the past 240 hour(s))   Culture, blood (Routine x 2)     Status: None (Preliminary result)   Collection Time: 01/14/17  8:00 PM  Result Value Ref Range Status   Specimen Description BLOOD RIGHT ARM  Final   Special Requests   Final    BOTTLES DRAWN AEROBIC AND ANAEROBIC Blood Culture adequate volume   Culture NO GROWTH 4 DAYS  Final   Report Status PENDING  Incomplete  Culture, blood (Routine x 2)     Status: None (Preliminary result)   Collection Time: 01/14/17  8:15 PM  Result Value Ref Range Status   Specimen Description BLOOD RIGHT HAND  Final   Special Requests IN PEDIATRIC BOTTLE Blood Culture adequate volume  Final   Culture NO GROWTH 4 DAYS  Final   Report Status PENDING  Incomplete     Labs: BNP (last 3 results) No results for input(s): BNP in the last 8760 hours. Basic Metabolic Panel: Recent Labs  Lab 01/14/17 2015 01/15/17 0301 01/16/17 0603 01/18/17 0613  NA 137 141 137 141  K 3.8 4.4 3.5 3.4*  CL 104 110 105 107  CO2 23 24 24 24   GLUCOSE 124* 150* 172* 126*  BUN 12 13 10 8   CREATININE 1.31* 1.27* 1.20 0.97  CALCIUM 9.2 7.9* 8.1* 8.3*   Liver Function Tests: Recent Labs  Lab 01/14/17 2015  AST 64*  ALT 131*  ALKPHOS 62  BILITOT 1.8*  PROT 6.8  ALBUMIN 4.0   No results for input(s): LIPASE, AMYLASE in the last 168 hours. No results for input(s): AMMONIA in the last 168 hours. CBC: Recent Labs  Lab 01/14/17 2015 01/15/17 0301 01/16/17 0603 01/18/17 0613  WBC 19.6* 15.1* 8.9 6.6  NEUTROABS 17.4*  --   --   --   HGB 15.4 12.9* 12.8* 12.5*  HCT 45.6 39.3 38.3* 36.4*  MCV 84.1 84.5 84.4 82.7  PLT 191 155 131* 177   Cardiac Enzymes: No results for input(s): CKTOTAL, CKMB, CKMBINDEX, TROPONINI in the last 168 hours. BNP: Invalid input(s): POCBNP CBG: No results for input(s): GLUCAP in the last 168 hours. D-Dimer No results for input(s): DDIMER in the last 72 hours. Hgb A1c No results for input(s): HGBA1C in the last  72 hours. Lipid Profile No results for  input(s): CHOL, HDL, LDLCALC, TRIG, CHOLHDL, LDLDIRECT in the last 72 hours. Thyroid function studies No results for input(s): TSH, T4TOTAL, T3FREE, THYROIDAB in the last 72 hours.  Invalid input(s): FREET3 Anemia work up No results for input(s): VITAMINB12, FOLATE, FERRITIN, TIBC, IRON, RETICCTPCT in the last 72 hours. Urinalysis    Component Value Date/Time   COLORURINE YELLOW 01/14/2017 2015   APPEARANCEUR CLEAR 01/14/2017 2015   LABSPEC 1.012 01/14/2017 2015   PHURINE 5.0 01/14/2017 2015   GLUCOSEU NEGATIVE 01/14/2017 2015   HGBUR NEGATIVE 01/14/2017 2015   BILIRUBINUR NEGATIVE 01/14/2017 2015   KETONESUR NEGATIVE 01/14/2017 2015   PROTEINUR NEGATIVE 01/14/2017 2015   UROBILINOGEN 0.2 11/12/2010 0903   NITRITE NEGATIVE 01/14/2017 2015   LEUKOCYTESUR NEGATIVE 01/14/2017 2015   Sepsis Labs Invalid input(s): PROCALCITONIN,  WBC,  LACTICIDVEN Microbiology Recent Results (from the past 240 hour(s))  Culture, blood (Routine x 2)     Status: None (Preliminary result)   Collection Time: 01/14/17  8:00 PM  Result Value Ref Range Status   Specimen Description BLOOD RIGHT ARM  Final   Special Requests   Final    BOTTLES DRAWN AEROBIC AND ANAEROBIC Blood Culture adequate volume   Culture NO GROWTH 4 DAYS  Final   Report Status PENDING  Incomplete  Culture, blood (Routine x 2)     Status: None (Preliminary result)   Collection Time: 01/14/17  8:15 PM  Result Value Ref Range Status   Specimen Description BLOOD RIGHT HAND  Final   Special Requests IN PEDIATRIC BOTTLE Blood Culture adequate volume  Final   Culture NO GROWTH 4 DAYS  Final   Report Status PENDING  Incomplete     Time coordinating discharge: 35 minutes  SIGNED:  Chipper Oman, MD  [facility] 01/19/2017, 11:16 AM  Pager please text page via  [email or web address]

## 2017-01-19 NOTE — Progress Notes (Signed)
Patient was discharged home via wheelchair with a friend.  Patient has written and verbal instructions given.  Patient verbalizes understanding the instructions and will be responsible for calling and making follow up appointment

## 2017-01-20 ENCOUNTER — Telehealth: Payer: Self-pay | Admitting: Family Medicine

## 2017-01-20 NOTE — Telephone Encounter (Signed)
I tried to reach pt for a TCM call, left a voice message for pt to return my call.

## 2017-01-21 ENCOUNTER — Telehealth: Payer: Self-pay | Admitting: Family Medicine

## 2017-01-21 DIAGNOSIS — L03115 Cellulitis of right lower limb: Secondary | ICD-10-CM

## 2017-01-21 NOTE — Telephone Encounter (Signed)
Pt would like to know if Dr. Maudie Mercury can put in a referral for compression therapy on right lower leg, this was recommended during hospital stay recently. [facility] [address]  phone number is [phone]. Pt does have appointment with Dr. Maudie Mercury on 01/27/2017 for hospital follow up, however he cannot make the appointment without referral. Please return patient call.

## 2017-01-21 NOTE — Telephone Encounter (Signed)
[facility] Care Management Follow-up Telephone Call  Benjamin Mcdowell  LSL:[id number]  DOB: 12-17-1965  DOA: 01/14/2017 PCP: Lucretia Kern, DO  Admit date: 01/14/2017 Discharge date: 01/19/2017  Admitted From: Home  Disposition:  Home   Recommendations for Outpatient Follow-up:  1. Follow up with PCP in 1-2 weeks 2. Please obtain BMP/CBC in one week 3. Please follow up on the following pending results: Final blood cultures so far no growth 4. Complete Zyvox for 2 weeks, also to complete steroids for 4 more days 5. Consider dermatology referral 6. Check LFT's in 4 weeks  7. Follow-up with infectious disease Dr Tommy Medal   Discharge Condition: Stable    How have you been since you were released from the hospital? "better"   Do you understand why you were in the hospital? yes   Do you understand the discharge instructions? yes   Where were you discharged to? Home   Items Reviewed:  Medications reviewed: yes  Allergies reviewed: yes  Dietary changes reviewed: yes  Referrals reviewed: yes   Functional Questionnaire:   Activities of Daily Living (ADLs):   He states they are independent in the following: ambulation, bathing and hygiene, feeding, continence, grooming, toileting and dressing States they require assistance with the following: none   Any transportation issues/concerns?: no   Any patient concerns? no   Confirmed importance and date/time of follow-up visits scheduled yes  Provider Appointment booked with Dr. Maudie Mercury on 01/27/17 Tuesday at 2:30 pm  Confirmed with patient if condition begins to worsen call PCP or go to the ER.  Patient was given the office number and encouraged to call back with question or concerns.  : yes

## 2017-01-22 NOTE — Telephone Encounter (Signed)
I am not sure insurance will honor referral without evaluation. Also I am not sure what he is requesting? Compression socks? Something else? Can you contact the rehab center to see what is needed?

## 2017-01-22 NOTE — Telephone Encounter (Signed)
Please advise 

## 2017-01-23 NOTE — Telephone Encounter (Signed)
Referral placed.

## 2017-01-23 NOTE — Telephone Encounter (Signed)
Ok to refer. Thanks

## 2017-01-23 NOTE — Telephone Encounter (Signed)
Called patient, he is requesting lymphedema therapy per discharge summary.  The only place locally that does this [facility] [address] (part of [facility]). Okay to place referral?   I did advise patient that he should keep scheduled appts with PCP and that we may not be able to refer without face to face visit. There is a picture of his cellulitis in his discharge summary if that's helpful.

## 2017-01-27 ENCOUNTER — Encounter: Payer: Self-pay | Admitting: Family Medicine

## 2017-01-27 ENCOUNTER — Ambulatory Visit: Payer: 59 | Admitting: Family Medicine

## 2017-01-27 ENCOUNTER — Telehealth: Payer: Self-pay | Admitting: *Deleted

## 2017-01-27 VITALS — BP 92/70 | HR 88 | Temp 98.2°F | Ht 72.0 in | Wt 282.9 lb

## 2017-01-27 DIAGNOSIS — R739 Hyperglycemia, unspecified: Secondary | ICD-10-CM

## 2017-01-27 DIAGNOSIS — E669 Obesity, unspecified: Secondary | ICD-10-CM

## 2017-01-27 DIAGNOSIS — R740 Nonspecific elevation of levels of transaminase and lactic acid dehydrogenase [LDH]: Secondary | ICD-10-CM

## 2017-01-27 DIAGNOSIS — A419 Sepsis, unspecified organism: Secondary | ICD-10-CM | POA: Diagnosis not present

## 2017-01-27 DIAGNOSIS — L03115 Cellulitis of right lower limb: Secondary | ICD-10-CM | POA: Diagnosis not present

## 2017-01-27 DIAGNOSIS — R7401 Elevation of levels of liver transaminase levels: Secondary | ICD-10-CM

## 2017-01-27 NOTE — Patient Instructions (Addendum)
BEFORE YOU LEAVE: -follow up: 3-4 months -Lab appointment in 1 month to check your liver enzymes -avoid alcohol and Tylenol this month  Please let us know if you have not been contacted about the lymphedema clinic appointment in the next 1-2 weeks. Complete your medications per instructions. Please continue your Lasix every day, elevation and compression. Work on a healthy low sugar diet, regular aerobic exercise (at least 150 minutes/week) and shoot for a 20 pound weight reduction over the next 3-4 months.   We recommend the following healthy lifestyle for LIFE: 1) Small portions. But, make sure to get regular (at least 3 per day), healthy meals and small healthy snacks if needed.  2) Eat a healthy clean diet.   TRY TO EAT: -at least 5-7 servings of low sugar, colorful, and nutrient rich vegetables per day (not corn, potatoes or bananas.) -berries are the best choice if you wish to eat fruit (only eat small amounts if trying to reduce weight)  -lean meets (fish, white meat of chicken or [place]) -vegan proteins for some meals - beans or tofu, whole grains, nuts and seeds -Replace bad fats with good fats - good fats include: fish, nuts and seeds, canola oil, olive oil -small amounts of low fat or non fat dairy -small amounts of100 % whole grains - check the lables -drink plenty of water  AVOID: -SUGAR, sweets, anything with added sugar, corn syrup or sweeteners - must read labels as even foods advertised as "healthy" often are loaded with sugar -if you must have a sweetener, small amounts of stevia may be best -sweetened beverages and artificially sweetened beverages -simple starches (rice, bread, potatoes, pasta, chips, etc - small amounts of 100% whole grains are ok) -red meat, pork, butter -fried foods, fast food, processed food, excessive dairy, eggs and coconut.  3)Get at least 150 minutes of sweaty aerobic exercise per week.  4)Reduce stress - consider counseling, meditation  and relaxation to balance other aspects of your life.

## 2017-01-27 NOTE — Telephone Encounter (Signed)
I called the pt and he stated he has the Cologuard test at home and will complete this soon.  Message sent to Dr Maudie Mercury.

## 2017-01-27 NOTE — Progress Notes (Signed)
HPI:  Benjamin Mcdowell is a pleasant 52 y.o. with a PMH significant for chronic lower extremity edema and recurrent cellulitis, hyperglycemia, hyperlipidemia, here for a hospital follow up. See transitional care phone note in Epic. Per review of discharge documents and patient: Hospitalized 1/9 to 01/19/2017 Primary admitting complaint(s): Fever, right lower extremity edema, cellulitis of the right lower extremity Primary admitting diagnosis (es) and treatment: Cellulitis, treated with antibiotics, IV doxy and Zosyn in the hospital along with vancomycin.  Discharged on oral Zyvox.  He is to follow-up with infectious disease in the lymphedema clinic. Other significant diagnosis (es) and treatment: Onychomycosis, treated with Diflucan.  Chronic lymphedema, and Lasix started on discharge and lymphedema therapy advised outpatient.  Acute renal insufficiency, resolved with IV fluids per discharge summary. Follow up concerns per discharge document: Blood cultures negative to date, plugged-in with lymphedema clinic, infectious disease follow-up, renal function back to baseline on discharge, WBCs back to normal on discharge Reports today: Doing well, feels well, all fevers, pain, redness of resolved.  Tolerating the medications well.    ROS: See pertinent positives and negatives per HPI.  Past Medical History:  Diagnosis Date  . Allergic drug rash   . Anal fissure    "lanced it w/my hemorrhoids"  . Cancer of skin of back    "don't know which kind"  . Cellulitis    recurrent cellulitis right foot-per patient-per patient in hospital for IV antibiotics 01/2012  . Cellulitis of right lower extremity hospitalized 12/21/2014  . Edema    LE, R>L since child hood  . Erythema nodosum   . Hemorrhoids   . Vasculitis of skin     Past Surgical History:  Procedure Laterality Date  . [place]   "had them lanced; also lanced anal fissure"  . HERNIA REPAIR    . PENIS REVASCULARIZATION SURGERY     . UMBILICAL HERNIA REPAIR  12/24/2007   with Proceed ventral    Family History  Problem Relation Age of Onset  . Prostate cancer Father   . Heart disease Unknown        GRANDFATHER    Social History   Socioeconomic History  . Marital status: Married    Spouse name: None  . Number of children: None  . Years of education: None  . Highest education level: None  Social Needs  . Financial resource strain: None  . Food insecurity - worry: None  . Food insecurity - inability: None  . Transportation needs - medical: None  . Transportation needs - non-medical: None  Occupational History  . None  Tobacco Use  . Smoking status: Never Smoker  . Smokeless tobacco: Never Used  Substance and Sexual Activity  . Alcohol use: Yes    Alcohol/week: 3.6 oz    Types: 6 Cans of beer per week  . Drug use: Yes    Types: Marijuana    Comment: 12/21/2014 "couple times q 3-4 months"  . Sexual activity: Yes  Other Topics Concern  . None  Social History Narrative   Work or School: Insurance account manager Situation: lives with wife and 58 yo twins in 2016      Spiritual Beliefs: non practicing Christian      Lifestyle: started exercise and diet changes in summer 2016        Current Outpatient Medications:  .  fluconazole (DIFLUCAN) 200 MG tablet, Take 1 tablet (200 mg total) by mouth daily., Disp: 22 tablet, Rfl: 0 .  furosemide (LASIX) 20 MG tablet, Take 1 tablet (20 mg total) by mouth daily as needed for edema. Take once daily in the morning., Disp: 30 tablet, Rfl: 0 .  linezolid (ZYVOX) 600 MG tablet, Take 1 tablet (600 mg total) by mouth 2 (two) times daily for 14 days., Disp: 28 tablet, Rfl: 0  EXAM:  Vitals:   01/27/17 1431  BP: 92/70  Pulse: 88  Temp: 98.2 F (36.8 C)  SpO2: 97%    Body mass index is 38.37 kg/m.  GENERAL: vitals reviewed and listed above, alert, oriented, appears well hydrated and in no acute distress  HEENT: atraumatic, conjunttiva clear, no  obvious abnormalities on inspection of external nose and ears  NECK: no obvious masses on inspection  LUNGS: clear to auscultation bilaterally, no wheezes, rales or rhonchi, good air movement  CV: HRRR  MS: moves all extremities without noticeable abnormality  PSYCH: pleasant and cooperative, no obvious depression or anxiety  ASSESSMENT AND PLAN:  Discussed the following assessment and plan:  Cellulitis of right lower extremity  Sepsis, due to unspecified organism (HCC)  Obesity, Class II, BMI 35-39.9, isolated  Transaminitis - Plan: Hepatic function panel  Hyperglycemia  -Seems to be doing well, finish out medications -Referral to the lymphedema clinic has been placed, my assistant check on this and provided him with the number to schedule -He admits he has stopped his Lasix and had not been doing as well with his diet and exercise prior to this episode -Stressed importance of a healthy diet, low sugar diet, regular exercise, weight reduction and promoting overall health -Check liver enzymes in a few weeks -Patient advised to return or notify a doctor immediately if symptoms worsen or persist or new concerns arise.  Noticed after appointment that his colon cancer screening is past due.  Advised my assistant to call him to review this and review options and to help him set this up.  Patient Instructions  BEFORE YOU LEAVE: -follow up: 3-4 months -Lab appointment in 1 month to check your liver enzymes -avoid alcohol and Tylenol this month  Please let us know if you have not been contacted about the lymphedema clinic appointment in the next 1-2 weeks. Complete your medications per instructions. Please continue your Lasix every day, elevation and compression. Work on a healthy low sugar diet, regular aerobic exercise (at least 150 minutes/week) and shoot for a 20 pound weight reduction over the next 3-4 months.   We recommend the following healthy lifestyle for LIFE: 1)  Small portions. But, make sure to get regular (at least 3 per day), healthy meals and small healthy snacks if needed.  2) Eat a healthy clean diet.   TRY TO EAT: -at least 5-7 servings of low sugar, colorful, and nutrient rich vegetables per day (not corn, potatoes or bananas.) -berries are the best choice if you wish to eat fruit (only eat small amounts if trying to reduce weight)  -lean meets (fish, white meat of chicken or [place]) -vegan proteins for some meals - beans or tofu, whole grains, nuts and seeds -Replace bad fats with good fats - good fats include: fish, nuts and seeds, canola oil, olive oil -small amounts of low fat or non fat dairy -small amounts of100 % whole grains - check the lables -drink plenty of water  AVOID: -SUGAR, sweets, anything with added sugar, corn syrup or sweeteners - must read labels as even foods advertised as "healthy" often are loaded with sugar -if you must have a  sweetener, small amounts of stevia may be best -sweetened beverages and artificially sweetened beverages -simple starches (rice, bread, potatoes, pasta, chips, etc - small amounts of 100% whole grains are ok) -red meat, pork, butter -fried foods, fast food, processed food, excessive dairy, eggs and coconut.  3)Get at least 150 minutes of sweaty aerobic exercise per week.  4)Reduce stress - consider counseling, meditation and relaxation to balance other aspects of your life.      Lucretia Kern, DO

## 2017-01-27 NOTE — Telephone Encounter (Signed)
-----   Message from Lucretia Kern, DO sent at 01/27/2017  2:54 PM EST ----- Roney Mans, I noticed after his appointment that his colonoscopy is past due.  Can you please contact him to review this health maintenance item that is overdue.  Please see what he would like to do.  Please order if needed.  Thank you.

## 2017-01-30 DIAGNOSIS — I890 Lymphedema, not elsewhere classified: Secondary | ICD-10-CM | POA: Diagnosis not present

## 2017-01-30 DIAGNOSIS — L03115 Cellulitis of right lower limb: Secondary | ICD-10-CM | POA: Diagnosis not present

## 2017-02-05 ENCOUNTER — Inpatient Hospital Stay: Payer: 59 | Admitting: Infectious Disease

## 2017-02-09 DIAGNOSIS — I890 Lymphedema, not elsewhere classified: Secondary | ICD-10-CM | POA: Diagnosis not present

## 2017-02-09 DIAGNOSIS — L03115 Cellulitis of right lower limb: Secondary | ICD-10-CM | POA: Diagnosis not present

## 2017-02-27 ENCOUNTER — Other Ambulatory Visit: Payer: 59

## 2017-03-02 ENCOUNTER — Inpatient Hospital Stay: Payer: 59 | Admitting: Infectious Disease

## 2017-03-06 ENCOUNTER — Other Ambulatory Visit: Payer: 59

## 2017-03-06 DIAGNOSIS — I890 Lymphedema, not elsewhere classified: Secondary | ICD-10-CM | POA: Diagnosis not present

## 2017-03-06 DIAGNOSIS — L03115 Cellulitis of right lower limb: Secondary | ICD-10-CM | POA: Diagnosis not present

## 2017-03-16 ENCOUNTER — Other Ambulatory Visit: Payer: 59

## 2017-03-17 ENCOUNTER — Ambulatory Visit: Payer: 59 | Admitting: Family Medicine

## 2017-03-17 ENCOUNTER — Other Ambulatory Visit: Payer: 59

## 2017-04-13 ENCOUNTER — Other Ambulatory Visit (INDEPENDENT_AMBULATORY_CARE_PROVIDER_SITE_OTHER): Payer: 59

## 2017-04-13 DIAGNOSIS — R740 Nonspecific elevation of levels of transaminase and lactic acid dehydrogenase [LDH]: Secondary | ICD-10-CM

## 2017-04-13 DIAGNOSIS — R7401 Elevation of levels of liver transaminase levels: Secondary | ICD-10-CM

## 2017-04-13 LAB — HEPATIC FUNCTION PANEL
ALK PHOS: 61 U/L (ref 39–117)
ALT: 64 U/L — ABNORMAL HIGH (ref 0–53)
AST: 27 U/L (ref 0–37)
Albumin: 4.3 g/dL (ref 3.5–5.2)
BILIRUBIN DIRECT: 0.2 mg/dL (ref 0.0–0.3)
BILIRUBIN TOTAL: 1.1 mg/dL (ref 0.2–1.2)
Total Protein: 6.7 g/dL (ref 6.0–8.3)

## 2017-04-19 NOTE — Progress Notes (Signed)
HPI:  Using dictation device. Unfortunately this device frequently misinterprets words/phrases.  Due for colon ca screening, CPE with labs next visit  Obeisty/HLD/Hypertriglyceridemia/HTN: -lifestyle changes advised, reports trying to do some wt watchers and doing a little better -meds: lasix  Chronic LE edema, hx recurrent cellulitis, venous insuff, lymphedema: -hx recurrent hospitalizations for sepsis 2ndary to cellulitis of the R leg -seeing Dr. Scot Dock - Vasc Surg - elevation, compression, water aerobics, exercise advised -meds: lasix -did extensive lymphedema therapy 2019 with plans for long term thigh high compression  -reports doing much better  ROS: See pertinent positives and negatives per HPI.  Past Medical History:  Diagnosis Date  . Allergic drug rash   . Anal fissure    "lanced it w/my hemorrhoids"  . Cancer of skin of back    "don't know which kind"  . Cellulitis    recurrent cellulitis right foot-per patient-per patient in hospital for IV antibiotics 01/2012  . Cellulitis of right lower extremity hospitalized 12/21/2014  . Edema    LE, R>L since child hood  . Erythema nodosum   . Hemorrhoids   . Vasculitis of skin     Past Surgical History:  Procedure Laterality Date  . [place]   "had them lanced; also lanced anal fissure"  . HERNIA REPAIR    . PENIS REVASCULARIZATION SURGERY    . UMBILICAL HERNIA REPAIR  12/24/2007   with Proceed ventral    Family History  Problem Relation Age of Onset  . Prostate cancer Father   . Heart disease Unknown        GRANDFATHER    SOCIAL HX: see hpi   Current Outpatient Medications:  .  furosemide (LASIX) 20 MG tablet, Take 1 tablet (20 mg total) by mouth daily as needed for edema. Take once daily in the morning., Disp: 30 tablet, Rfl: 0  EXAM:  Vitals:   04/21/17 0847  BP: 100/80  Pulse: 64  Temp: 98 F (36.7 C)    Body mass index is 38.23 kg/m.  GENERAL: vitals reviewed and listed  above, alert, oriented, appears well hydrated and in no acute distress  HEENT: atraumatic, conjunttiva clear, no obvious abnormalities on inspection of external nose and ears  NECK: no obvious masses on inspection  LUNGS: clear to auscultation bilaterally, no wheezes, rales or rhonchi, good air movement  CV: HRRR, LE edema R, no signs infection, improved from in the past - not wearing compression  MS: moves all extremities without noticeable abnormality  PSYCH: pleasant and cooperative, no obvious depression or anxiety  ASSESSMENT AND PLAN:  Discussed the following assessment and plan:  High triglycerides  Hyperglycemia  CHRONIC Edema of right lower extremity  Morbid obesity (HCC) - bmi >35 + hyperglycemia, hypertrig, htn  -recommend chronic daily compression (not wearing today) -lifestyle recs -has not yet done colon cancer screening - stress importance and advised to do promptly, offered help, he reports has kit at home for cologuard - advised to call cologuard if any questions or cant find kit and complete in next 2 weeks -labs at CPE in 3-4 months -Patient advised to return or notify a doctor immediately if symptoms worsen or persist or new concerns arise.  Patient Instructions  BEFORE YOU LEAVE: -cologuard brochure -follow up: CPE with labs in 3-4 months  Please complete your cologuard test!   We recommend the following healthy lifestyle for LIFE: 1) Small portions. But, make sure to get regular (at least 3 per day), healthy meals and  small healthy snacks if needed.  2) Eat a healthy clean diet.   TRY TO EAT: -at least 5-7 servings of low sugar, colorful, and nutrient rich vegetables per day (not corn, potatoes or bananas.) -berries are the best choice if you wish to eat fruit (only eat small amounts if trying to reduce weight)  -lean meets (fish, white meat of chicken or [place]) -vegan proteins for some meals - beans or tofu, whole grains, nuts and  seeds -Replace bad fats with good fats - good fats include: fish, nuts and seeds, canola oil, olive oil -small amounts of low fat or non fat dairy -small amounts of100 % whole grains - check the lables -drink plenty of water  AVOID: -SUGAR, sweets, anything with added sugar, corn syrup or sweeteners - must read labels as even foods advertised as "healthy" often are loaded with sugar -if you must have a sweetener, small amounts of stevia may be best -sweetened beverages and artificially sweetened beverages -simple starches (rice, bread, potatoes, pasta, chips, etc - small amounts of 100% whole grains are ok) -red meat, pork, butter -fried foods, fast food, processed food, excessive dairy, eggs and coconut.  3)Get at least 150 minutes of sweaty aerobic exercise per week.  4)Reduce stress - consider counseling, meditation and relaxation to balance other aspects of your life.     Lucretia Kern, DO

## 2017-04-21 ENCOUNTER — Encounter: Payer: Self-pay | Admitting: Family Medicine

## 2017-04-21 ENCOUNTER — Ambulatory Visit: Payer: 59 | Admitting: Family Medicine

## 2017-04-21 VITALS — BP 100/80 | HR 64 | Temp 98.0°F | Ht 72.0 in | Wt 281.9 lb

## 2017-04-21 DIAGNOSIS — R600 Localized edema: Secondary | ICD-10-CM

## 2017-04-21 DIAGNOSIS — R739 Hyperglycemia, unspecified: Secondary | ICD-10-CM

## 2017-04-21 DIAGNOSIS — E781 Pure hyperglyceridemia: Secondary | ICD-10-CM

## 2017-04-21 NOTE — Patient Instructions (Addendum)
BEFORE YOU LEAVE: -cologuard brochure -follow up: CPE with labs in 3-4 months  Please complete your cologuard test!   We recommend the following healthy lifestyle for LIFE: 1) Small portions. But, make sure to get regular (at least 3 per day), healthy meals and small healthy snacks if needed.  2) Eat a healthy clean diet.   TRY TO EAT: -at least 5-7 servings of low sugar, colorful, and nutrient rich vegetables per day (not corn, potatoes or bananas.) -berries are the best choice if you wish to eat fruit (only eat small amounts if trying to reduce weight)  -lean meets (fish, white meat of chicken or [place]) -vegan proteins for some meals - beans or tofu, whole grains, nuts and seeds -Replace bad fats with good fats - good fats include: fish, nuts and seeds, canola oil, olive oil -small amounts of low fat or non fat dairy -small amounts of100 % whole grains - check the lables -drink plenty of water  AVOID: -SUGAR, sweets, anything with added sugar, corn syrup or sweeteners - must read labels as even foods advertised as "healthy" often are loaded with sugar -if you must have a sweetener, small amounts of stevia may be best -sweetened beverages and artificially sweetened beverages -simple starches (rice, bread, potatoes, pasta, chips, etc - small amounts of 100% whole grains are ok) -red meat, pork, butter -fried foods, fast food, processed food, excessive dairy, eggs and coconut.  3)Get at least 150 minutes of sweaty aerobic exercise per week.  4)Reduce stress - consider counseling, meditation and relaxation to balance other aspects of your life.

## 2017-05-28 ENCOUNTER — Ambulatory Visit: Payer: 59 | Admitting: Family Medicine

## 2018-05-06 IMAGING — DX DG CHEST 2V
2 series · 2 of 2 positions shown · non-contrast
Comparison: 12/21/2014

CLINICAL DATA: fever as high as 103 this morning with generalized
body aches, hx of cellulitis that typically presents this way,
denies any redness or pain in right foot where cellulitis normally
is. Pt a/ox4, fever 102.3 in triage. Abnormal CG-4

EXAM:
CHEST  2 VIEW

[chest pa]
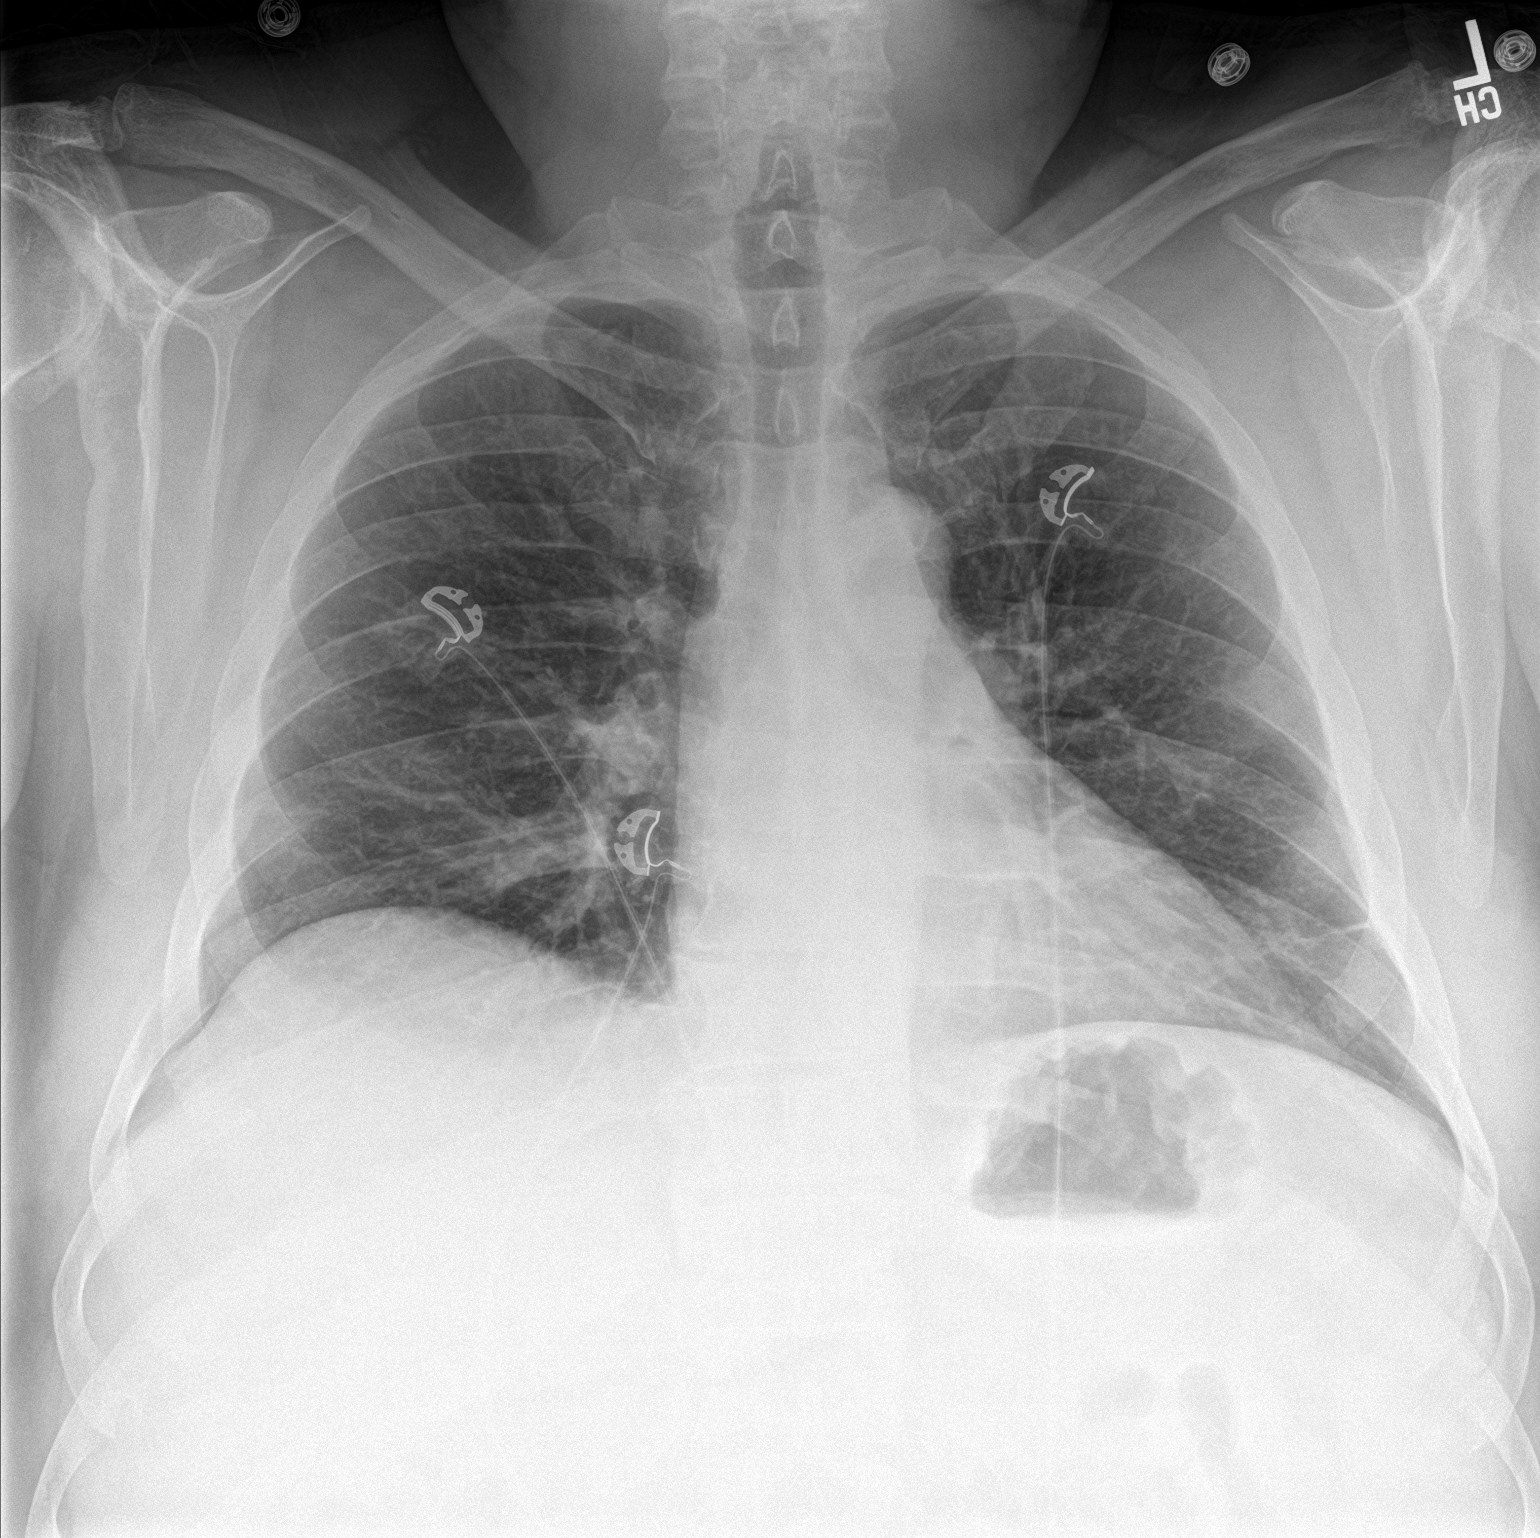

[chest lat]
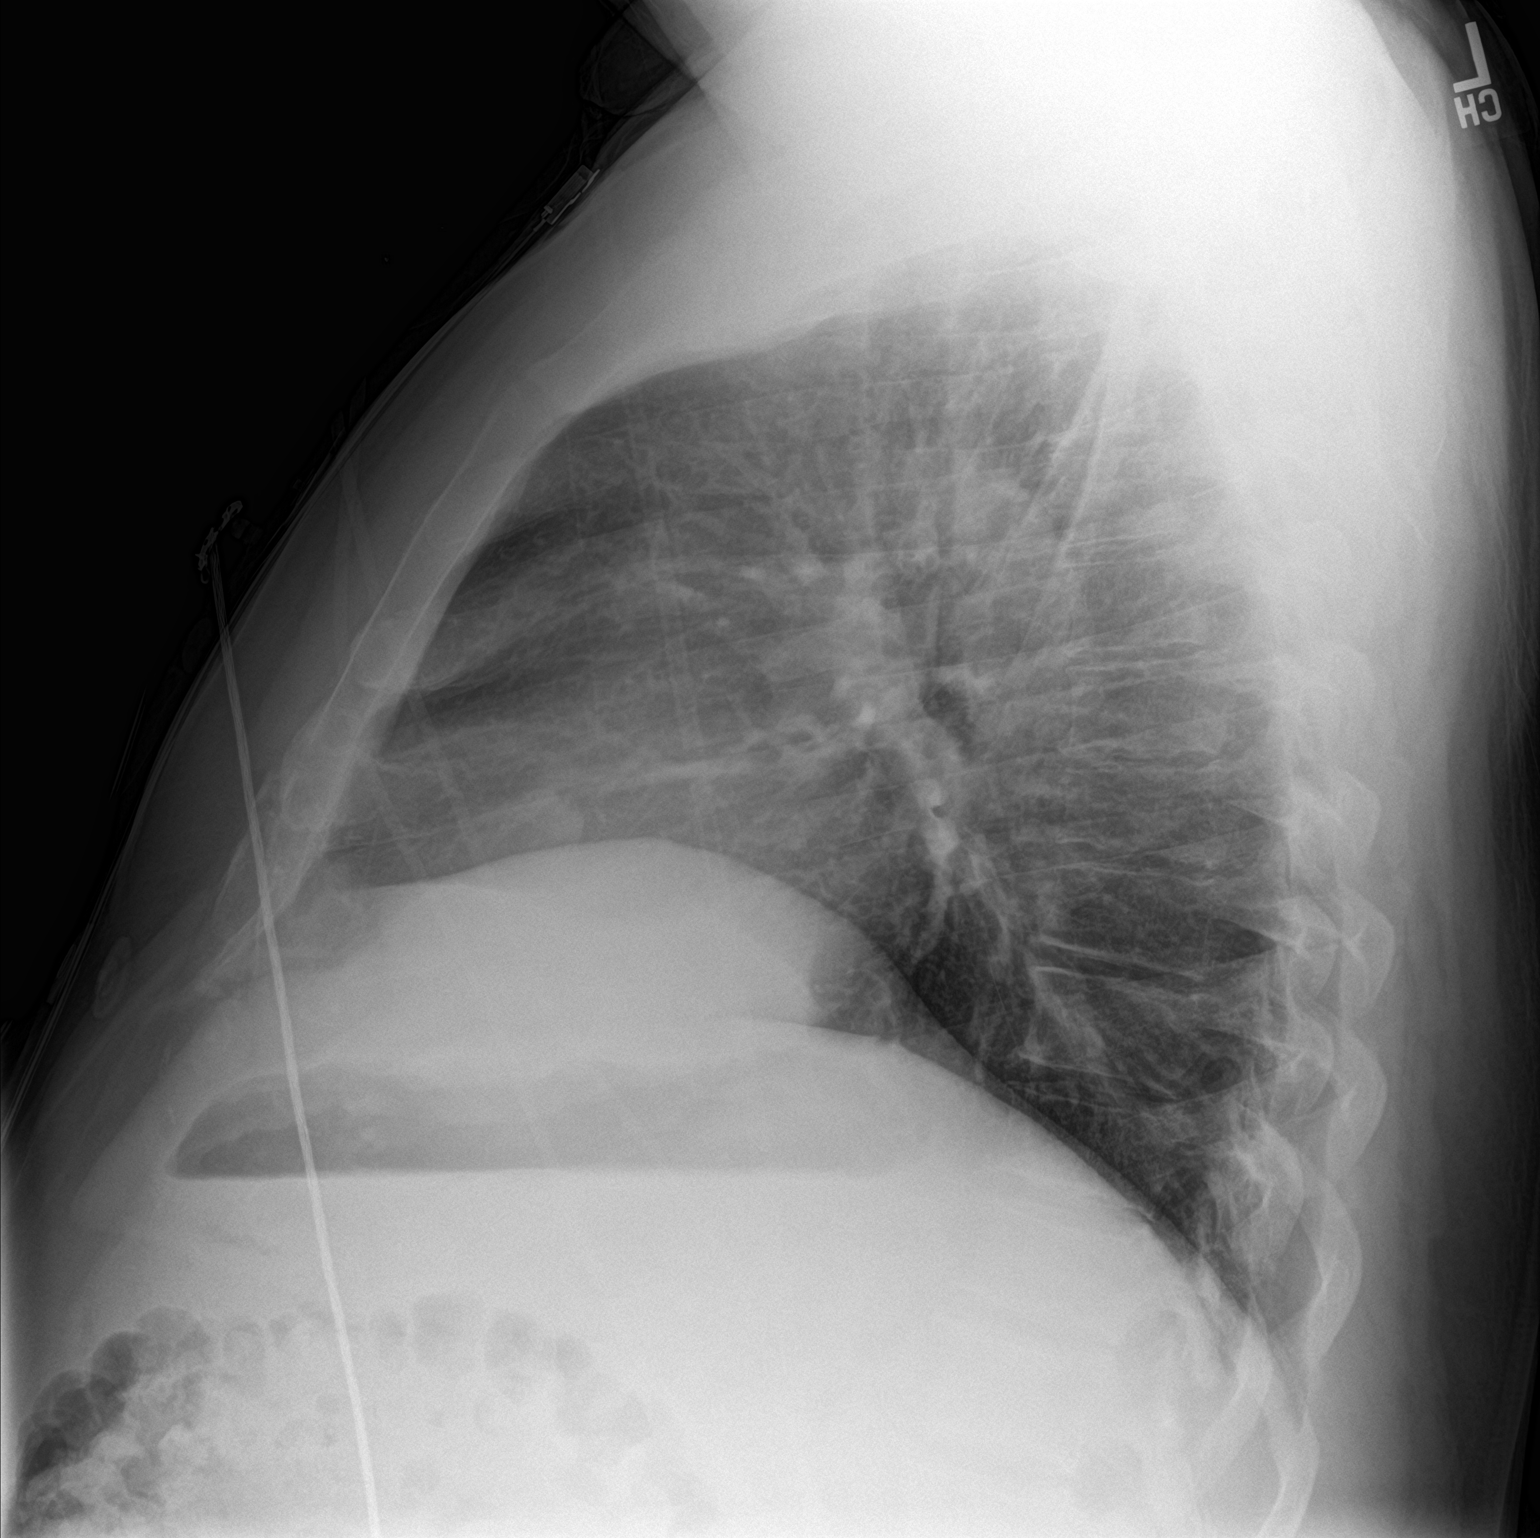

[2 of 2 positions shown; findings below may reference images not displayed]

FINDINGS: Normal mediastinum and cardiac silhouette. Normal pulmonary
vasculature. No evidence of effusion, infiltrate, or pneumothorax.
No acute bony abnormality.
IMPRESSION: No acute cardiopulmonary process.

## 2018-08-11 ENCOUNTER — Other Ambulatory Visit: Payer: Self-pay

## 2018-08-11 DIAGNOSIS — Z20822 Contact with and (suspected) exposure to covid-19: Secondary | ICD-10-CM

## 2018-08-13 ENCOUNTER — Encounter: Payer: Self-pay | Admitting: Family Medicine

## 2018-08-13 ENCOUNTER — Other Ambulatory Visit: Payer: Self-pay | Admitting: Family Medicine

## 2018-08-13 DIAGNOSIS — Z1211 Encounter for screening for malignant neoplasm of colon: Secondary | ICD-10-CM

## 2018-08-13 LAB — NOVEL CORONAVIRUS, NAA: SARS-CoV-2, NAA: NOT DETECTED

## 2018-08-13 NOTE — Telephone Encounter (Signed)
Needs TOC appointment.   Please find out details for colonoscopy need. I put in referral, but not sure if he is having issue which he would need to be seen for first.

## 2018-08-16 ENCOUNTER — Encounter: Payer: Self-pay | Admitting: Internal Medicine

## 2018-08-16 NOTE — Telephone Encounter (Signed)
I called the pt and informed him of the message below, he asked for a screening colonoscopy due to his age and denies any problems.  Also scheduled an appt for 9/11 with Dr Ethlyn Gallery.

## 2018-09-16 NOTE — Progress Notes (Signed)
Virtual Visit via Video Note  I connected with Benjamin Mcdowell   on 09/17/18 at  8:30 AM EDT by a video enabled telemedicine application and verified that I am speaking with the correct person using two identifiers.  Location patient: home Location provider:work office Persons participating in the virtual visit: patient, provider  I discussed the limitations of evaluation and management by telemedicine and the availability of in person appointments. The patient expressed understanding and agreed to proceed.   Benjamin Mcdowell DOB: 1965/11/30 Encounter date: 09/17/2018  This is a 53 y.o. male who presents to establish care. Due for bloodwork, colonoscopy. Chief Complaint  Patient presents with  . Establish Care  . Foot Swelling    requests to change to Torsemide as Furosemide did not help    History of present illness: HLD/HTN: although this was listed historically he has never been on treatment for blood pressure or cholesterol. Advised to watch diet and exercise.   Chronic LE edema/lymphedema: follows with Dr. Scot Dock vasc surg. Has issues with cellulitis that flares if he has any leg injury. To point now where he requires IV abx treatment when it flares. Right leg carries more water than left leg. Had friend that takes torsemide and gave him a couple and he took them in addition to compression therapy which took legs down to about half size. States he only took 20mg  of the lasix and never had this increased. Never really seemed to make him urinate more. Infections started over 30 years ago with ingrown toenail. Since that time every bump/scrape has become infected. Started with just foot being infected, swollen, but then migrated up to leg also flaring with cellulitis after work injury with metal cart scraping back of heel (5 years ago).  Has colonoscopy scheduled for later this month. Hx of hemorrhoids. They are present but not causing problems for him.  Rare flare; just deals with this.    Trying to walk more regularly. Doing walking challenge with neighbors now. States has gained weight during COVID.   Does follow with derm for hx of skin cancer. Will schedule this follow up.  Past Medical History:  Diagnosis Date  . Allergic drug rash   . Anal fissure    "lanced it w/my hemorrhoids"  . Cancer of skin of back    "don't know which kind"  . Cellulitis    recurrent cellulitis right foot-per patient-per patient in hospital for IV antibiotics 01/2012  . Cellulitis of right lower extremity hospitalized 12/21/2014  . Edema    LE, R>L since child hood  . Erythema nodosum   . Hemorrhoids   . Vasculitis of skin    Past Surgical History:  Procedure Laterality Date  . [place]   "had them lanced; also lanced anal fissure"  . PENIS REVASCULARIZATION SURGERY    . UMBILICAL HERNIA REPAIR  12/24/2007   with Proceed ventral   No Known Allergies Current Meds  Medication Sig  . [DISCONTINUED] furosemide (LASIX) 20 MG tablet Take 1 tablet (20 mg total) by mouth daily as needed for edema. Take once daily in the morning.   Social History   Tobacco Use  . Smoking status: Never Smoker  . Smokeless tobacco: Never Used  Substance Use Topics  . Alcohol use: Yes    Alcohol/week: 6.0 standard drinks    Types: 6 Cans of beer per week   Family History  Problem Relation Age of Onset  . Prostate cancer Father   . Skin cancer  Mother   . Heart disease Other        GRANDFATHER  . Dementia Maternal Grandmother   . Heart attack Maternal Grandfather   . Stroke Paternal Grandmother 59  . Skin cancer Paternal Grandfather   . Stroke Paternal Grandfather 71  . Stroke Maternal Aunt   . Prostate cancer Maternal Uncle        ?specifics unknown     Review of Systems  Constitutional: Negative for chills, fatigue and fever.  Respiratory: Negative for cough, chest tightness, shortness of breath and wheezing.   Cardiovascular: Positive for leg swelling. Negative for  chest pain and palpitations.    Objective:  There were no vitals taken for this visit.      BP Readings from Last 3 Encounters:  04/21/17 100/80  01/27/17 92/70  01/19/17 131/83   Wt Readings from Last 3 Encounters:  04/21/17 281 lb 14.4 oz (127.9 kg)  01/27/17 282 lb 14.4 oz (128.3 kg)  01/14/17 290 lb (131.5 kg)    EXAM:  GENERAL: alert, oriented, appears well and in no acute distress  HEENT: atraumatic, conjunctiva clear, no obvious abnormalities on inspection of external nose and ears  NECK: normal movements of the head and neck  LUNGS: on inspection no signs of respiratory distress, breathing rate appears normal, no obvious gross SOB, gasping or wheezing  CV: no obvious cyanosis  MS: moves all visible extremities without noticeable abnormality  PSYCH/NEURO: pleasant and cooperative, no obvious depression or anxiety, speech and thought processing grossly intact  Depression screen [facility] 2/9 09/17/2018 01/29/2015  Decreased Interest 0 0  Down, Depressed, Hopeless 0 0  PHQ - 2 Score 0 0     Assessment/Plan  1. CHRONIC Edema of right lower extremity We will increase lasix to 40mg  to see if this helps with fluid release since there was no improvement with 20mg . Advised ok to increase to 60mg  if doesn't see change with the 40mg , but if no improvement with 60mg  then please contact provider for further recommendations. - CBC with Differential/Platelet; Future - Comprehensive metabolic panel; Future  2. High triglycerides - Lipid panel; Future - TSH; Future  3. Hyperglycemia - Hemoglobin A1c; Future  4. History of skin cancer Will call to schedule follow up with dermatology.   Has colonoscopy scheduled this month.     Return for pending bloodwork; due for physical.   I discussed the assessment and treatment plan with the patient. The patient was provided an opportunity to ask questions and all were answered. The patient agreed with the plan and demonstrated an  understanding of the instructions.   The patient was advised to call back or seek an in-person evaluation if the symptoms worsen or if the condition fails to improve as anticipated.  I provided 30 minutes of non-face-to-face time during this encounter.   Micheline Rough, MD

## 2018-09-17 ENCOUNTER — Telehealth (INDEPENDENT_AMBULATORY_CARE_PROVIDER_SITE_OTHER): Payer: 59 | Admitting: Family Medicine

## 2018-09-17 ENCOUNTER — Other Ambulatory Visit: Payer: Self-pay

## 2018-09-17 ENCOUNTER — Encounter: Payer: Self-pay | Admitting: Family Medicine

## 2018-09-17 DIAGNOSIS — Z125 Encounter for screening for malignant neoplasm of prostate: Secondary | ICD-10-CM

## 2018-09-17 DIAGNOSIS — R739 Hyperglycemia, unspecified: Secondary | ICD-10-CM

## 2018-09-17 DIAGNOSIS — E781 Pure hyperglyceridemia: Secondary | ICD-10-CM | POA: Diagnosis not present

## 2018-09-17 DIAGNOSIS — R600 Localized edema: Secondary | ICD-10-CM

## 2018-09-17 DIAGNOSIS — Z85828 Personal history of other malignant neoplasm of skin: Secondary | ICD-10-CM

## 2018-09-17 MED ORDER — FUROSEMIDE 40 MG PO TABS: 40.0000 mg | ORAL_TABLET | Freq: Every day | ORAL | 1 refills | Status: DC | PRN

## 2018-09-17 NOTE — Addendum Note (Signed)
Addended by: Caren Macadam on: 09/17/2018 09:23 AM   Modules accepted: Orders

## 2018-09-20 ENCOUNTER — Other Ambulatory Visit: Payer: Self-pay

## 2018-09-20 ENCOUNTER — Ambulatory Visit (AMBULATORY_SURGERY_CENTER): Payer: Self-pay | Admitting: *Deleted

## 2018-09-20 VITALS — Temp 97.6°F | Ht 72.0 in | Wt 308.0 lb

## 2018-09-20 DIAGNOSIS — Z1211 Encounter for screening for malignant neoplasm of colon: Secondary | ICD-10-CM

## 2018-09-20 NOTE — Progress Notes (Signed)
No egg or soy allergy known to patient  No issues with past sedation with any surgeries  or procedures, no intubation problems  No diet pills per patient No home 02 use per patient  No blood thinners per patient  Pt denies issues with constipation  No A fib or A flutter  EMMI video sent to pt's e mail  

## 2018-09-21 ENCOUNTER — Encounter: Payer: Self-pay | Admitting: Internal Medicine

## 2018-09-21 NOTE — Progress Notes (Signed)
Left a detailed message at the pts cell number to call back for a lab appt as below.

## 2018-09-24 ENCOUNTER — Telehealth: Payer: Self-pay | Admitting: Internal Medicine

## 2018-09-24 NOTE — Telephone Encounter (Signed)

## 2018-09-27 ENCOUNTER — Other Ambulatory Visit: Payer: Self-pay | Admitting: Internal Medicine

## 2018-09-27 ENCOUNTER — Ambulatory Visit (AMBULATORY_SURGERY_CENTER): Payer: 59 | Admitting: Internal Medicine

## 2018-09-27 ENCOUNTER — Encounter: Payer: Self-pay | Admitting: Internal Medicine

## 2018-09-27 ENCOUNTER — Other Ambulatory Visit: Payer: Self-pay

## 2018-09-27 VITALS — BP 127/85 | HR 71 | Temp 98.8°F | Resp 13 | Ht 72.0 in | Wt 308.0 lb

## 2018-09-27 DIAGNOSIS — D123 Benign neoplasm of transverse colon: Secondary | ICD-10-CM

## 2018-09-27 DIAGNOSIS — Z1211 Encounter for screening for malignant neoplasm of colon: Secondary | ICD-10-CM | POA: Diagnosis not present

## 2018-09-27 DIAGNOSIS — D124 Benign neoplasm of descending colon: Secondary | ICD-10-CM

## 2018-09-27 MED ORDER — SODIUM CHLORIDE 0.9 % IV SOLN: 500.0000 mL | Freq: Once | INTRAVENOUS | Status: DC

## 2018-09-27 NOTE — Progress Notes (Signed)
Called to room to assist during endoscopic procedure.  Patient ID and intended procedure confirmed with present staff. Received instructions for my participation in the procedure from the performing physician.  

## 2018-09-27 NOTE — Op Note (Signed)
[place] Patient Name: Benjamin Mcdowell Procedure Date: 09/27/2018 9:08 AM MRN: [id number] Endoscopist: Gatha Mayer , MD Age: 53 Referring MD:  Date of Birth: 05/13/1965 Gender: Male Account #: [id number] Procedure:                Colonoscopy Indications:              Screening for colorectal malignant neoplasm, This                            is the patient's first colonoscopy Medicines:                Propofol per Anesthesia, Monitored Anesthesia Care Procedure:                After obtaining informed consent, the colonoscope                            was passed under direct vision. Throughout the                            procedure, the patient's blood pressure, pulse, and                            oxygen saturations were monitored continuously. The                            Colonoscope was introduced through the anus and                            advanced to the the cecum, identified by                            appendiceal orifice and ileocecal valve. The                            colonoscopy was performed without difficulty. The                            patient tolerated the procedure well. The quality                            of the bowel preparation was good. The bowel                            preparation used was Miralax via split dose                            instruction. The ileocecal valve, appendiceal                            orifice, and rectum were photographed. Scope In: 9:19:26 AM Scope Out: 9:34:16 AM Scope Withdrawal Time: 0 hours 11 minutes 54 seconds  Total Procedure Duration: 0 hours 14 minutes 50 seconds  Findings:                 The perianal and digital rectal examinations  were                            normal. Pertinent negatives include normal prostate                            (size, shape, and consistency).                           A diminutive polyp was found in the transverse                            colon. The polyp  was sessile. The polyp was removed                            with a hot snare. Resection and retrieval were                            complete. Verification of patient identification                            for the specimen was done. Estimated blood loss was                            minimal.                           Scattered diverticula were found in the sigmoid                            colon.                           The exam was otherwise without abnormality on                            direct and retroflexion views. Complications:            No immediate complications. Estimated Blood Loss:     Estimated blood loss was minimal. Impression:               - One diminutive polyp in the transverse colon,                            removed with a hot snare. Resected and retrieved.                           - Diverticulosis in the sigmoid colon.                           - The examination was otherwise normal on direct                            and retroflexion views. Recommendation:           - Patient has a contact number available for  emergencies. The signs and symptoms of potential                            delayed complications were discussed with the                            patient. Return to normal activities tomorrow.                            Written discharge instructions were provided to the                            patient.                           - Resume previous diet.                           - Continue present medications.                           - Repeat colonoscopy is recommended. The                            colonoscopy date will be determined after pathology                            results from today's exam become available for                            review. Gatha Mayer, MD 09/27/2018 9:39:53 AM This report has been signed electronically.

## 2018-09-27 NOTE — Progress Notes (Signed)
To PACU, VSS. Report to [email or web address] 

## 2018-09-27 NOTE — Patient Instructions (Addendum)
I found and removed one tiny polyp and saw some diverticulosis.  I will let you know pathology results and when to have another routine colonoscopy by mail and/or My Chart.  I appreciate the opportunity to care for you. Gatha Mayer, MD, [facility]    Await pathology results on polyp removed    YOU HAD AN ENDOSCOPIC PROCEDURE TODAY AT [facility]:   Refer to the procedure report that was given to you for any specific questions about what was found during the examination.  If the procedure report does not answer your questions, please call your gastroenterologist to clarify.  If you requested that your care partner not be given the details of your procedure findings, then the procedure report has been included in a sealed envelope for you to review at your convenience later.  YOU SHOULD EXPECT: Some feelings of bloating in the abdomen. Passage of more gas than usual.  Walking can help get rid of the air that was put into your GI tract during the procedure and reduce the bloating. If you had a lower endoscopy (such as a colonoscopy or flexible sigmoidoscopy) you may notice spotting of blood in your stool or on the toilet paper. If you underwent a bowel prep for your procedure, you may not have a normal bowel movement for a few days.  Please Note:  You might notice some irritation and congestion in your nose or some drainage.  This is from the oxygen used during your procedure.  There is no need for concern and it should clear up in a day or so.  SYMPTOMS TO REPORT IMMEDIATELY:   Following lower endoscopy (colonoscopy or flexible sigmoidoscopy):  Excessive amounts of blood in the stool  Significant tenderness or worsening of abdominal pains  Swelling of the abdomen that is new, acute  Fever of 100F or higher    For urgent or emergent issues, a gastroenterologist can be reached at any hour by calling [phone].   DIET:  We do recommend a small meal at first, but then  you may proceed to your regular diet.  Drink plenty of fluids but you should avoid alcoholic beverages for 24 hours.  ACTIVITY:  You should plan to take it easy for the rest of today and you should NOT DRIVE or use heavy machinery until tomorrow (because of the sedation medicines used during the test).    FOLLOW UP: Our staff will call the number listed on your records 48-72 hours following your procedure to check on you and address any questions or concerns that you may have regarding the information given to you following your procedure. If we do not reach you, we will leave a message.  We will attempt to reach you two times.  During this call, we will ask if you have developed any symptoms of COVID 19. If you develop any symptoms (ie: fever, flu-like symptoms, shortness of breath, cough etc.) before then, please call [phone].  If you test positive for Covid 19 in the 2 weeks post procedure, please call and report this information to [place].    If any biopsies were taken you will be contacted by phone or by letter within the next 1-3 weeks.  Please call us at [phone] if you have not heard about the biopsies in 3 weeks.    SIGNATURES/CONFIDENTIALITY: You and/or your care partner have signed paperwork which will be entered into your electronic medical record.  These signatures attest to the fact that  that the information above on your After Visit Summary has been reviewed and is understood.  Full responsibility of the confidentiality of this discharge information lies with you and/or your care-partner.

## 2018-09-27 NOTE — Progress Notes (Signed)
Pt's states no medical or surgical changes since previsit or office visit.  [place]  Vital signs-[facility] [place]

## 2018-09-29 ENCOUNTER — Telehealth: Payer: Self-pay

## 2018-09-29 NOTE — Telephone Encounter (Signed)
  Follow up Call-  Call back number 09/27/2018  Post procedure Call Back phone  # [phone]  Permission to leave phone message Yes  Some recent data might be hidden     Patient questions:  Do you have a fever, pain , or abdominal swelling? No. Pain Score  0 *  Have you tolerated food without any problems? Yes.    Have you been able to return to your normal activities? Yes.    Do you have any questions about your discharge instructions: Diet   No. Medications  No. Follow up visit  No.  Do you have questions or concerns about your Care? No.  Actions: * If pain score is 4 or above: No action needed, pain <4. 1. Have you developed a fever since your procedure? no  2.   Have you had an respiratory symptoms (SOB or cough) since your procedure? no  3.   Have you tested positive for COVID 19 since your procedure non  4.   Have you had any family members/close contacts diagnosed with the COVID 19 since your procedure?  No    If yes to any of these questions please route to Joylene John, RN and Alphonsa Gin, RN.

## 2018-10-06 ENCOUNTER — Encounter: Payer: Self-pay | Admitting: Internal Medicine

## 2018-10-06 DIAGNOSIS — Z86010 Personal history of colonic polyps: Secondary | ICD-10-CM

## 2018-10-06 DIAGNOSIS — Z860101 Personal history of adenomatous and serrated colon polyps: Secondary | ICD-10-CM

## 2018-10-06 HISTORY — DX: Personal history of adenomatous and serrated colon polyps: Z86.0101

## 2018-10-06 HISTORY — DX: Personal history of colonic polyps: Z86.010

## 2018-10-06 NOTE — Progress Notes (Signed)
My Chart Diminutive adenoma Recall 2027

## 2019-03-04 ENCOUNTER — Other Ambulatory Visit: Payer: Self-pay | Admitting: Family Medicine

## 2019-03-04 DIAGNOSIS — R600 Localized edema: Secondary | ICD-10-CM

## 2019-04-11 ENCOUNTER — Telehealth: Payer: Self-pay

## 2019-04-11 NOTE — Telephone Encounter (Signed)
ok 

## 2019-04-11 NOTE — Telephone Encounter (Signed)
Patient would like to do a TOC to see Dr.Caleb Parker at the [facility].Patient would like to establish care with Dr.Parker because his wife is currently a patient at this office. Patient states that he was hospitalized in [place] a few weeks ago for chronic leg issues. Encouraged patient to continue to see his PCP until the [facility] appt is approved both both providers.

## 2019-04-13 ENCOUNTER — Telehealth (INDEPENDENT_AMBULATORY_CARE_PROVIDER_SITE_OTHER): Payer: 59 | Admitting: Family Medicine

## 2019-04-13 ENCOUNTER — Encounter: Payer: Self-pay | Admitting: *Deleted

## 2019-04-13 ENCOUNTER — Telehealth: Payer: Self-pay | Admitting: *Deleted

## 2019-04-13 DIAGNOSIS — I499 Cardiac arrhythmia, unspecified: Secondary | ICD-10-CM | POA: Diagnosis not present

## 2019-04-13 DIAGNOSIS — R0683 Snoring: Secondary | ICD-10-CM

## 2019-04-13 DIAGNOSIS — R739 Hyperglycemia, unspecified: Secondary | ICD-10-CM | POA: Diagnosis not present

## 2019-04-13 DIAGNOSIS — L03115 Cellulitis of right lower limb: Secondary | ICD-10-CM | POA: Diagnosis not present

## 2019-04-13 DIAGNOSIS — E781 Pure hyperglyceridemia: Secondary | ICD-10-CM

## 2019-04-13 NOTE — Progress Notes (Unsigned)
Patient ID: Benjamin Mcdowell, male   DOB: Mar 10, 1965, 54 y.o.   MRN: [id number] Patient enrolled for [employer] to ship a 30 day cardiac event monitor to his home.  Instructions sent to patient via My Chart message and will also be included in his monitor kit.

## 2019-04-13 NOTE — Progress Notes (Signed)
Virtual Visit via Video Note  I connected with Benjamin Mcdowell  on 04/13/19 at  8:00 AM EDT by a video enabled telemedicine application and verified that I am speaking with the correct person using two identifiers.  Location patient: home Location provider:work or home office Persons participating in the virtual visit: patient, provider  I discussed the limitations of evaluation and management by telemedicine and the availability of in person appointments. The patient expressed understanding and agreed to proceed.   Benjamin Mcdowell DOB: September 07, 1965 Encounter date: 04/13/2019  This is a 54 y.o. male who presents with No chief complaint on file.   History of present illness:  Went to [place] for family vacation for skiing. Has chronic cellulitis foot/lower right leg. Has typically had recurrence about every few years. Thinks irritation from ski boots started issue.  Tuesday afternoon layed down and was pretty out of it, hallucinating. Thought altitude at first.  In urgent care on 3/30 for RLL cellulitis and SOB. Temp 101, tachycardic with O2 in 80's on RA. Transferred to [facility] for further eval/r/o DVT/PE.  Was in hospital 3.5 days, IV abx, had +blood. While there it was noted that at 3-4am he had "heart skipped a beat" - went 6 seconds without beat. Happened again about a day later. Then had another one that was shorter. Recommended that he have follow up with cardiology due to this. Wonders if this is related to sleep apnea. States he is overweight and does snore. Always early in morning when he is asleep which is why he feels there might be related. Per hospital discharge: "Pt had a 6 second sinus pause, no sx, on 4-1. ECHO nl, initial EKG unremarkable. No hx syncope or any cardiac issues. Does have OSA. K 3.5, trops were negative. Repeat EKG nl. Sinus pause 6.6 sec recurred middle of night/am on 4-3. No sx but he was asleep. Cardiology at [facility] reviewed the case and  strips and doesn't recommend any acute Rx here. Rather, pt can return home, get a 30 day event monitor, see cardiology and get a sleep study. "   Never had sleep eval in the past.   Had cardiac echo completed in hospital; DVT was ruled out.    Sent home on linezolid x 14 days. Still taking his lasix. Had low potassium in hospital,but hasn't had issues with this before.   A1C was 6.2 in hospital. He is working on healthier eating, lower carb eating and would like to lose weight.   No Known Allergies No outpatient medications have been marked as taking for the 04/13/19 encounter (Appointment) with Caren Macadam, MD.    Review of Systems  Constitutional: Negative for chills, fatigue and fever.  Respiratory: Negative for cough, chest tightness, shortness of breath and wheezing.   Cardiovascular: Negative for chest pain, palpitations (no; but see hpi) and leg swelling (improved).  Skin: Positive for color change (has improved).    Objective:  There were no vitals taken for this visit.      BP Readings from Last 3 Encounters:  09/27/18 127/85  04/21/17 100/80  01/27/17 92/70   Wt Readings from Last 3 Encounters:  09/27/18 (!) 308 lb (139.7 kg)  09/20/18 (!) 308 lb (139.7 kg)  04/21/17 281 lb 14.4 oz (127.9 kg)    EXAM:  GENERAL: alert, oriented, appears well and in no acute distress  HEENT: atraumatic, conjunctiva clear, no obvious abnormalities on inspection of external nose and ears  NECK: normal movements  of the head and neck  LUNGS: on inspection no signs of respiratory distress, breathing rate appears normal, no obvious gross SOB, gasping or wheezing  CV: no obvious cyanosis  MS: moves all visible extremities without noticeable abnormality  PSYCH/NEURO: pleasant and cooperative, no obvious depression or anxiety, speech and thought processing grossly intact  Assessment/Plan 1. Cellulitis of right leg Improving; continue with antibiotic treatment. He is  planning to get UNA boot to help with circulation/healing since he is not always compliant with wrapping/elevating. - CBC with Differential/Platelet; Future  2. Hyperglycemia A1C was 6.2 in hospital. He is going to work on healthier eating. May benefit from more specific discussion regarding diabetic diet.  3. Cardiac arrhythmia, unspecified cardiac arrhythmia type ekg stable in hospital and normal echo. Will refer to cardiology, order event monitor, and complete bloodwork. Advised to let us know if any symptoms of arrhythmia in meanwhile. - Ambulatory referral to Cardiology - Cardiac event monitor; Future - TSH; Future  4. Snoring Per hospital d/c he has osa? Never had sleep study. May be related to abnormality with heart rhythm. - Ambulatory referral to Sleep Studies  5. Hypocalcemia - Comprehensive metabolic panel; Future  6. High triglycerides - Lipid panel; Future  Return for pending labs. (he is transferring care to [facility] but no appointment yet so we will help with facilitating above eval until he is established there)   I discussed the assessment and treatment plan with the patient. The patient was provided an opportunity to ask questions and all were answered. The patient agreed with the plan and demonstrated an understanding of the instructions.   The patient was advised to call back or seek an in-person evaluation if the symptoms worsen or if the condition fails to improve as anticipated.  I provided 25 minutes of non-face-to-face time during this encounter. Additional 10 minutes spent in chart review, note completion.    Micheline Rough, MD

## 2019-04-13 NOTE — Telephone Encounter (Signed)
Left a detailed message at the pts cell number to call for a lab appt as below.  °

## 2019-04-13 NOTE — Telephone Encounter (Signed)
-----   Message from Caren Macadam, MD sent at 04/13/2019  8:30 AM EDT ----- #please set up lab visit for next week to recheck bloodwork

## 2019-04-15 ENCOUNTER — Telehealth: Payer: Self-pay | Admitting: Family Medicine

## 2019-04-15 ENCOUNTER — Other Ambulatory Visit: Payer: Self-pay

## 2019-04-15 NOTE — Telephone Encounter (Signed)
Tried contacting patient to set up TOC visit to Dr.Parker but phone was busy.

## 2019-04-18 ENCOUNTER — Other Ambulatory Visit (INDEPENDENT_AMBULATORY_CARE_PROVIDER_SITE_OTHER): Payer: 59

## 2019-04-18 ENCOUNTER — Other Ambulatory Visit: Payer: Self-pay

## 2019-04-18 DIAGNOSIS — I499 Cardiac arrhythmia, unspecified: Secondary | ICD-10-CM | POA: Diagnosis not present

## 2019-04-18 DIAGNOSIS — E781 Pure hyperglyceridemia: Secondary | ICD-10-CM

## 2019-04-18 DIAGNOSIS — L03115 Cellulitis of right lower limb: Secondary | ICD-10-CM

## 2019-04-18 DIAGNOSIS — R739 Hyperglycemia, unspecified: Secondary | ICD-10-CM | POA: Diagnosis not present

## 2019-04-18 LAB — COMPREHENSIVE METABOLIC PANEL
ALT: 47 U/L (ref 0–53)
AST: 20 U/L (ref 0–37)
Albumin: 3.9 g/dL (ref 3.5–5.2)
Alkaline Phosphatase: 60 U/L (ref 39–117)
BUN: 19 mg/dL (ref 6–23)
CO2: 29 mEq/L (ref 19–32)
Calcium: 8.9 mg/dL (ref 8.4–10.5)
Chloride: 102 mEq/L (ref 96–112)
Creatinine, Ser: 0.93 mg/dL (ref 0.40–1.50)
GFR: 84.78 mL/min (ref >60.00)
Glucose, Bld: 139 mg/dL — ABNORMAL HIGH (ref 70–99)
Potassium: 4.6 mEq/L (ref 3.5–5.1)
Sodium: 140 mEq/L (ref 135–145)
Total Bilirubin: 0.5 mg/dL (ref 0.2–1.2)
Total Protein: 6.3 g/dL (ref 6.0–8.3)

## 2019-04-18 LAB — CBC WITH DIFFERENTIAL/PLATELET
Basophils Absolute: 0 10*3/uL (ref 0.0–0.1)
Basophils Relative: 0.8 % (ref 0.0–3.0)
Eosinophils Absolute: 0.1 10*3/uL (ref 0.0–0.7)
Eosinophils Relative: 2 % (ref 0.0–5.0)
HCT: 41.1 % (ref 39.0–52.0)
Hemoglobin: 14.1 g/dL (ref 13.0–17.0)
Lymphocytes Relative: 31.8 % (ref 12.0–46.0)
Lymphs Abs: 1.9 10*3/uL (ref 0.7–4.0)
MCHC: 34.2 g/dL (ref 30.0–36.0)
MCV: 84.4 fl (ref 78.0–100.0)
Monocytes Absolute: 0.4 10*3/uL (ref 0.1–1.0)
Monocytes Relative: 5.9 % (ref 3.0–12.0)
Neutro Abs: 3.6 10*3/uL (ref 1.4–7.7)
Neutrophils Relative %: 59.5 % (ref 43.0–77.0)
Platelets: 368 10*3/uL (ref 150.0–400.0)
RBC: 4.87 Mil/uL (ref 4.22–5.81)
RDW: 14 % (ref 11.5–15.5)
WBC: 6 10*3/uL (ref 4.0–10.5)

## 2019-04-18 LAB — HEMOGLOBIN A1C: Hgb A1c MFr Bld: 6.3 % (ref 4.6–6.5)

## 2019-04-18 LAB — LIPID PANEL
Cholesterol: 130 mg/dL (ref 0–200)
HDL: 33.7 mg/dL — ABNORMAL LOW (ref >39.00)
LDL Cholesterol: 66 mg/dL (ref 0–99)
NonHDL: 96.07
Total CHOL/HDL Ratio: 4
Triglycerides: 148 mg/dL (ref 0.0–149.0)
VLDL: 29.6 mg/dL (ref 0.0–40.0)

## 2019-04-18 LAB — TSH: TSH: 1.56 u[IU]/mL (ref 0.35–4.50)

## 2019-04-21 NOTE — Progress Notes (Signed)
Cardiology Office Note:    Date:  04/23/2019   ID:  Benjamin Mcdowell, DOB May 31, 1965, MRN [id number]  PCP:  Caren Macadam, MD  Cardiologist:  No primary care provider on file.  Electrophysiologist:  None   Referring MD: Caren Macadam, MD   Chief Complaint  Patient presents with  . Bradycardia    History of Present Illness:    Benjamin Mcdowell is a 53 y.o. male with a hx of recurrent cellulitis who is referred by Dr. Ethlyn Gallery for evaluation of sinus pauses.  Recent hospital admission in [place].  Was in [place] for vacation.  Developed right lower extremity cellulitis.  Admitted with sepsis.  Per records had a 6-second sinus pause on 4/1.  TTE was done and was unremarkable.  Subsequently had a 6.6-second pause during the night of 4/3.  Cardiology consulted and recommended 30-day event monitor, cardiology follow-up, and sleep study.  He was discharged on a course of linezolid and amoxicillin.  He denies any episodes of lightheadedness or syncope.  Does report some dyspnea on exertion, but denies any chest pain.  States that he walks 1-2 times per week for 20 to 30 minutes.  Has also intermittently playing pickup basketball.  Takes Lasix 80 mg daily for chronic right lower extremity edema.    Past Medical History:  Diagnosis Date  . Allergic drug rash   . Anal fissure    "lanced it w/my hemorrhoids"  . Cancer of skin of back    "don't know which kind"  . Cellulitis    recurrent cellulitis right foot-per patient-per patient in hospital for IV antibiotics 01/2012  . Cellulitis of right lower extremity hospitalized 12/21/2014  . Edema    LE, R>L since child hood  . Erythema nodosum   . Hemorrhoids   . Hx of adenomatous polyp of colon 10/06/2018  . Sepsis ([place]) 05/02/2016  . Sickle cell trait ([place])   . Vasculitis of skin     Past Surgical History:  Procedure Laterality Date  . [place]   "had them lanced; also lanced anal fissure"  . PENIS  REVASCULARIZATION SURGERY    . UMBILICAL HERNIA REPAIR  12/24/2007   with Proceed ventral    Current Medications: Current Meds  Medication Sig  . amoxicillin (AMOXIL) 500 MG tablet Take 500 mg by mouth every 8 (eight) hours.  . furosemide (LASIX) 40 MG tablet Take by mouth.  Marland Kitchen ibuprofen (ADVIL) 800 MG tablet Take 800 mg by mouth every 8 (eight) hours as needed.  Marland Kitchen linezolid (ZYVOX) 600 MG tablet Take by mouth.     Allergies:   Patient has no known allergies.   Social History   Socioeconomic History  . Marital status: Married    Spouse name: Not on file  . Number of children: Not on file  . Years of education: Not on file  . Highest education level: Not on file  Occupational History  . Occupation: [employer] SUPPLY    Employer: [employer]  Tobacco Use  . Smoking status: Never Smoker  . Smokeless tobacco: Never Used  Substance and Sexual Activity  . Alcohol use: Yes    Alcohol/week: 6.0 standard drinks    Types: 6 Cans of beer per week  . Drug use: Yes    Types: Marijuana    Comment: 12/21/2014 "couple times q 3-4 months"  . Sexual activity: Yes  Other Topics Concern  . Not on file  Social History Narrative   Work or School: industrial  sales      Home Situation: lives with wife and 75 yo twins in 2016      Spiritual Beliefs: non practicing Christian      Lifestyle: started exercise and diet changes in summer 2016      Social Determinants of Health   Financial Resource Strain:   . Difficulty of Paying Living Expenses:   Food Insecurity:   . Worried About Charity fundraiser in the Last Year:   . Arboriculturist in the Last Year:   Transportation Needs:   . Film/video editor (Medical):   Marland Kitchen Lack of Transportation (Non-Medical):   Physical Activity:   . Days of Exercise per Week:   . Minutes of Exercise per Session:   Stress:   . Feeling of Stress :   Social Connections:   . Frequency of Communication with Friends and Family:   . Frequency of Social  Gatherings with Friends and Family:   . Attends Religious Services:   . Active Member of Clubs or Organizations:   . Attends Archivist Meetings:   Marland Kitchen Marital Status:      Family History: The patient's family history includes COPD in his paternal uncle; Colon cancer in his maternal uncle; Dementia in his maternal grandmother; Heart attack in his maternal grandfather; Heart disease in his paternal grandfather, paternal grandmother, and another family member; Prostate cancer in his father and maternal uncle; Skin cancer in his mother and paternal grandfather; Stroke in his maternal aunt; Stroke (age of onset: 39) in his paternal grandfather and paternal grandmother. There is no history of Esophageal cancer, Rectal cancer, or Stomach cancer.  ROS:   Please see the history of present illness.     All other systems reviewed and are negative.  EKGs/Labs/Other Studies Reviewed:    The following studies were reviewed today:  EKG:  EKG is  ordered today.  The ekg ordered today demonstrates normal sinus rhythm, rate 61, T wave inversion in leads III, aVF  TTE 3/32/21 ([facility]): Summary  Technically difficult study.  Spectral Doppler and Color Flow Velocity Mapping were used to interrogate  the valves and cardiac structures.   Normal left ventricular size.  Mildly increased left ventricle wall thickness. Normal left ventricular  systolic function, ejection fraction 65%.  Normal diastolic filling pattern.  Mild right ventricular dilatation. TAPSE measures 2.9 cm, consistent with  normal right ventricular systolic function.  Normal valves.  Recent Labs: 04/18/2019: ALT 47; BUN 19; Creatinine, Ser 0.93; Hemoglobin 14.1; Platelets 368.0; Potassium 4.6; Sodium 140; TSH 1.56  Recent Lipid Panel    Component Value Date/Time   CHOL 130 04/18/2019 0738   TRIG 148.0 04/18/2019 0738   HDL 33.70 (L) 04/18/2019 0738   CHOLHDL 4 04/18/2019 0738   VLDL 29.6 04/18/2019 0738   LDLCALC 66  04/18/2019 0738    Physical Exam:    VS:  BP 118/80   Pulse 61   Temp (!) 97 F (36.1 C)   Ht 6' (1.829 m)   Wt 291 lb (132 kg)   SpO2 93%   BMI 39.47 kg/m     Wt Readings from Last 3 Encounters:  04/22/19 291 lb (132 kg)  09/27/18 (!) 308 lb (139.7 kg)  09/20/18 (!) 308 lb (139.7 kg)     GEN:  Well nourished, well developed in no acute distress HEENT: Normal NECK: No JVD; No carotid bruits LYMPHATICS: No lymphadenopathy CARDIAC: RRR, no murmurs, rubs, gallops RESPIRATORY:  Clear to auscultation  without rales, wheezing or rhonchi  ABDOMEN: Soft, non-tender, non-distended MUSCULOSKELETAL:  Nonpitting RLE edema SKIN: Warm and dry NEUROLOGIC:  Alert and oriented x 3 PSYCHIATRIC:  Normal affect   ASSESSMENT:    1. Sinus pause    PLAN:    Sinus pauses: Up to 6.6 seconds while asleep during recent hospital admission for sepsis from right lower extremity cellulitis.  TTE unremarkable.  No symptoms to suggest significant sinus pauses while awake. Has been referred for sleep study.  Will follow up 30-day event monitor.    RTC in 2 months  Medication Adjustments/Labs and Tests Ordered: Current medicines are reviewed at length with the patient today.  Concerns regarding medicines are outlined above.  Orders Placed This Encounter  Procedures  . EKG 12-Lead   No orders of the defined types were placed in this encounter.   Patient Instructions  Medication Instructions:  Your physician recommends that you continue on your current medications as directed. Please refer to the Current Medication list given to you today.  Follow-Up: At [facility], you and your health needs are our priority.  As part of our continuing mission to provide you with exceptional heart care, we have created designated Provider Care Teams.  These Care Teams include your primary Cardiologist (physician) and Advanced Practice Providers (APPs -  Physician Assistants and Nurse Practitioners) who all  work together to provide you with the care you need, when you need it.  We recommend signing up for the patient portal called "MyChart".  Sign up information is provided on this After Visit Summary.  MyChart is used to connect with patients for Virtual Visits (Telemedicine).  Patients are able to view lab/test results, encounter notes, upcoming appointments, etc.  Non-urgent messages can be sent to your provider as well.   To learn more about what you can do with MyChart, go to [email or web address].    Your next appointment:   2 month(s)  The format for your next appointment:   In Person  Provider:   Oswaldo Milian, MD       Signed, Donato Heinz, MD  04/23/2019 8:26 PM    [place]

## 2019-04-22 ENCOUNTER — Ambulatory Visit: Payer: 59 | Admitting: Cardiology

## 2019-04-22 ENCOUNTER — Encounter: Payer: Self-pay | Admitting: Cardiology

## 2019-04-22 ENCOUNTER — Other Ambulatory Visit: Payer: Self-pay

## 2019-04-22 VITALS — BP 118/80 | HR 61 | Temp 97.0°F | Ht 72.0 in | Wt 291.0 lb

## 2019-04-22 DIAGNOSIS — I455 Other specified heart block: Secondary | ICD-10-CM | POA: Diagnosis not present

## 2019-04-22 NOTE — Patient Instructions (Signed)
Medication Instructions:  Your physician recommends that you continue on your current medications as directed. Please refer to the Current Medication list given to you today.  Follow-Up: At [facility], you and your health needs are our priority.  As part of our continuing mission to provide you with exceptional heart care, we have created designated Provider Care Teams.  These Care Teams include your primary Cardiologist (physician) and Advanced Practice Providers (APPs -  Physician Assistants and Nurse Practitioners) who all work together to provide you with the care you need, when you need it.  We recommend signing up for the patient portal called "MyChart".  Sign up information is provided on this After Visit Summary.  MyChart is used to connect with patients for Virtual Visits (Telemedicine).  Patients are able to view lab/test results, encounter notes, upcoming appointments, etc.  Non-urgent messages can be sent to your provider as well.   To learn more about what you can do with MyChart, go to [email or web address].    Your next appointment:   2 month(s)  The format for your next appointment:   In Person  Provider:   Oswaldo Milian, MD

## 2019-04-25 ENCOUNTER — Ambulatory Visit (INDEPENDENT_AMBULATORY_CARE_PROVIDER_SITE_OTHER): Payer: 59

## 2019-04-25 DIAGNOSIS — R000 Tachycardia, unspecified: Secondary | ICD-10-CM

## 2019-04-25 DIAGNOSIS — I495 Sick sinus syndrome: Secondary | ICD-10-CM

## 2019-04-25 DIAGNOSIS — I499 Cardiac arrhythmia, unspecified: Secondary | ICD-10-CM

## 2019-04-27 ENCOUNTER — Other Ambulatory Visit: Payer: Self-pay

## 2019-04-27 ENCOUNTER — Encounter: Payer: Self-pay | Admitting: Pulmonary Disease

## 2019-04-27 ENCOUNTER — Ambulatory Visit: Payer: 59 | Admitting: Pulmonary Disease

## 2019-04-27 VITALS — BP 108/70 | HR 76 | Temp 97.6°F | Ht 72.0 in | Wt 284.4 lb

## 2019-04-27 DIAGNOSIS — G4733 Obstructive sleep apnea (adult) (pediatric): Secondary | ICD-10-CM

## 2019-04-27 NOTE — Progress Notes (Signed)
Benjamin Mcdowell    [id number]    [birth year]  Primary Care Physician:Koberlein, Steele Berg, MD  Referring Physician: Caren Macadam, [place],  [place] [address]  Chief complaint:   Patient being seen for possible obstructive sleep apnea HPI:  History of snoring, no witnessed apneas Recently found to have sinus pauses and some cardiac issues for which he is being monitored recently  Longstanding history of snoring No dryness of his mouth in the mornings No morning headaches Memory is okay No family history of obstructive sleep apnea He can fall asleep easily sitting on the couch, will wake up and then go to bed he might find it difficult to go back to bed for up to an hour Falls asleep again and sleeps till about 5-6 AM  Weight is down recently, actively watching his weight  Never smoker  Shortness of breath with significant activity Irregular heartbeat  Outpatient Encounter Medications as of 04/27/2019  Medication Sig  . furosemide (LASIX) 40 MG tablet Take 80 mg by mouth.   Marland Kitchen ibuprofen (ADVIL) 800 MG tablet Take 800 mg by mouth every 8 (eight) hours as needed.  . [DISCONTINUED] amoxicillin (AMOXIL) 500 MG tablet Take 500 mg by mouth every 8 (eight) hours.   No facility-administered encounter medications on file as of 04/27/2019.    Allergies as of 04/27/2019  . (No Known Allergies)    Past Medical History:  Diagnosis Date  . Allergic drug rash   . Anal fissure    "lanced it w/my hemorrhoids"  . Cancer of skin of back    "don't know which kind"  . Cellulitis    recurrent cellulitis right foot-per patient-per patient in hospital for IV antibiotics 01/2012  . Cellulitis of right lower extremity hospitalized 12/21/2014  . Edema    LE, R>L since child hood  . Erythema nodosum   . Hemorrhoids   . Hx of adenomatous polyp of colon 10/06/2018  . Sepsis ([place]) 05/02/2016  . Sickle cell trait ([place])   . Vasculitis of skin     Past  Surgical History:  Procedure Laterality Date  . [place]   "had them lanced; also lanced anal fissure"  . PENIS REVASCULARIZATION SURGERY    . UMBILICAL HERNIA REPAIR  12/24/2007   with Proceed ventral    Family History  Problem Relation Age of Onset  . Prostate cancer Father   . Skin cancer Mother   . Heart disease Other        GRANDFATHER  . Dementia Maternal Grandmother   . Heart attack Maternal Grandfather   . Stroke Paternal Grandmother 58  . Heart disease Paternal Grandmother   . Skin cancer Paternal Grandfather   . Stroke Paternal Grandfather 68  . Heart disease Paternal Grandfather   . Stroke Maternal Aunt   . Prostate cancer Maternal Uncle        ?specifics unknown  . Colon cancer Maternal Uncle   . COPD Paternal Uncle   . Esophageal cancer Neg Hx   . Rectal cancer Neg Hx   . Stomach cancer Neg Hx     Social History   Socioeconomic History  . Marital status: Married    Spouse name: Not on file  . Number of children: Not on file  . Years of education: Not on file  . Highest education level: Not on file  Occupational History  . Occupation: INDUSTIAL SUPPLY    Employer:  [employer]  Tobacco Use  . Smoking status: Never Smoker  . Smokeless tobacco: Never Used  Substance and Sexual Activity  . Alcohol use: Yes    Alcohol/week: 6.0 standard drinks    Types: 6 Cans of beer per week  . Drug use: Yes    Types: Marijuana    Comment: 12/21/2014 "couple times q 3-4 months"  . Sexual activity: Yes  Other Topics Concern  . Not on file  Social History Narrative   Work or School: Insurance account manager Situation: lives with wife and 50 yo twins in 2016      Spiritual Beliefs: non practicing Christian      Lifestyle: started exercise and diet changes in summer 2016      Social Determinants of Health   Financial Resource Strain:   . Difficulty of Paying Living Expenses:   Food Insecurity:   . Worried About Charity fundraiser in the  Last Year:   . Arboriculturist in the Last Year:   Transportation Needs:   . Film/video editor (Medical):   Marland Kitchen Lack of Transportation (Non-Medical):   Physical Activity:   . Days of Exercise per Week:   . Minutes of Exercise per Session:   Stress:   . Feeling of Stress :   Social Connections:   . Frequency of Communication with Friends and Family:   . Frequency of Social Gatherings with Friends and Family:   . Attends Religious Services:   . Active Member of Clubs or Organizations:   . Attends Archivist Meetings:   Marland Kitchen Marital Status:   Intimate Partner Violence:   . Fear of Current or Ex-Partner:   . Emotionally Abused:   Marland Kitchen Physically Abused:   . Sexually Abused:     Review of Systems  HENT: Negative.   Cardiovascular: Negative.   Gastrointestinal: Negative.   Genitourinary: Negative.   Psychiatric/Behavioral: Positive for sleep disturbance.  All other systems reviewed and are negative.   Vitals:   04/27/19 1347  BP: 108/70  Pulse: 76  Temp: 97.6 F (36.4 C)  SpO2: 100%   Physical Exam  Constitutional: He appears well-developed and well-nourished.  HENT:  Head: Normocephalic and atraumatic.  Eyes: Pupils are equal, round, and reactive to light. Right eye exhibits no discharge.  Neck: No tracheal deviation present. No thyromegaly present.  Cardiovascular: Normal rate and regular rhythm.  Pulmonary/Chest: Effort normal and breath sounds normal. No respiratory distress. He has no wheezes. He has no rales. He exhibits no tenderness.  Musculoskeletal:        General: Normal range of motion.     Cervical back: Normal range of motion and neck supple.  Neurological: He is alert.  Skin: Skin is warm.  Psychiatric: He has a normal mood and affect.   Results of the Epworth flowsheet 04/27/2019  Sitting and reading 2  Watching TV 3  Sitting, inactive in a public place (e.g. a theatre or a meeting) 2  As a passenger in a car for an hour without a break 1    Lying down to rest in the afternoon when circumstances permit 3  Sitting and talking to someone 1  Sitting quietly after a lunch without alcohol 0  In a car, while stopped for a few minutes in traffic 0  Total score 12   Assessment:  Moderate to significant probability of significant obstructive sleep apnea  Obesity  Cardiac arrhythmia  Excessive daytime sleepiness  Pathophysiology of sleep disordered breathing discussed with the patient Treatment options for sleep disordered breathing discussed with the patient  Plan/Recommendations: We will schedule patient for home sleep study  We will update patient about findings  Encouraged to continue weight loss efforts  We will follow-up in 3 months   Sherrilyn Rist MD [facility] 04/27/2019, 2:17 PM  CC: Caren Macadam, MD

## 2019-04-27 NOTE — Patient Instructions (Signed)
Snoring History of recent cardiac issues  Possibility of obstructive sleep apnea  We will obtain a home sleep study We will update you with results Treatment options as discussed  We will follow-up in 2 to 3 months  Call with significant concerns Sleep Apnea Sleep apnea is a condition in which breathing pauses or becomes shallow during sleep. Episodes of sleep apnea usually last 10 seconds or longer, and they may occur as many as 20 times an hour. Sleep apnea disrupts your sleep and keeps your body from getting the rest that it needs. This condition can increase your risk of certain health problems, including:  Heart attack.  Stroke.  Obesity.  Diabetes.  Heart failure.  Irregular heartbeat. What are the causes? There are three kinds of sleep apnea:  Obstructive sleep apnea. This kind is caused by a blocked or collapsed airway.  Central sleep apnea. This kind happens when the part of the brain that controls breathing does not send the correct signals to the muscles that control breathing.  Mixed sleep apnea. This is a combination of obstructive and central sleep apnea. The most common cause of this condition is a collapsed or blocked airway. An airway can collapse or become blocked if:  Your throat muscles are abnormally relaxed.  Your tongue and tonsils are larger than normal.  You are overweight.  Your airway is smaller than normal. What increases the risk? You are more likely to develop this condition if you:  Are overweight.  Smoke.  Have a smaller than normal airway.  Are elderly.  Are male.  Drink alcohol.  Take sedatives or tranquilizers.  Have a family history of sleep apnea. What are the signs or symptoms? Symptoms of this condition include:  Trouble staying asleep.  Daytime sleepiness and tiredness.  Irritability.  Loud snoring.  Morning headaches.  Trouble concentrating.  Forgetfulness.  Decreased interest in sex.  Unexplained  sleepiness.  Mood swings.  Personality changes.  Feelings of depression.  Waking up often during the night to urinate.  Dry mouth.  Sore throat. How is this diagnosed? This condition may be diagnosed with:  A medical history.  A physical exam.  A series of tests that are done while you are sleeping (sleep study). These tests are usually done in a sleep lab, but they may also be done at home. How is this treated? Treatment for this condition aims to restore normal breathing and to ease symptoms during sleep. It may involve managing health issues that can affect breathing, such as high blood pressure or obesity. Treatment may include:  Sleeping on your side.  Using a decongestant if you have nasal congestion.  Avoiding the use of depressants, including alcohol, sedatives, and narcotics.  Losing weight if you are overweight.  Making changes to your diet.  Quitting smoking.  Using a device to open your airway while you sleep, such as: ? An oral appliance. This is a custom-made mouthpiece that shifts your lower jaw forward. ? A continuous positive airway pressure (CPAP) device. This device blows air through a mask when you breathe out (exhale). ? A nasal expiratory positive airway pressure (EPAP) device. This device has valves that you put into each nostril. ? A bi-level positive airway pressure (BPAP) device. This device blows air through a mask when you breathe in (inhale) and breathe out (exhale).  Having surgery if other treatments do not work. During surgery, excess tissue is removed to create a wider airway. It is important to get treatment for  sleep apnea. Without treatment, this condition can lead to:  High blood pressure.  Coronary artery disease.  In men, an inability to achieve or maintain an erection (impotence).  Reduced thinking abilities. Follow these instructions at home: Lifestyle  Make any lifestyle changes that your health care provider  recommends.  Eat a healthy, well-balanced diet.  Take steps to lose weight if you are overweight.  Avoid using depressants, including alcohol, sedatives, and narcotics.  Do not use any products that contain nicotine or tobacco, such as cigarettes, e-cigarettes, and chewing tobacco. If you need help quitting, ask your health care provider. General instructions  Take over-the-counter and prescription medicines only as told by your health care provider.  If you were given a device to open your airway while you sleep, use it only as told by your health care provider.  If you are having surgery, make sure to tell your health care provider you have sleep apnea. You may need to bring your device with you.  Keep all follow-up visits as told by your health care provider. This is important. Contact a health care provider if:  The device that you received to open your airway during sleep is uncomfortable or does not seem to be working.  Your symptoms do not improve.  Your symptoms get worse. Get help right away if:  You develop: ? Chest pain. ? Shortness of breath. ? Discomfort in your back, arms, or stomach.  You have: ? Trouble speaking. ? Weakness on one side of your body. ? Drooping in your face. These symptoms may represent a serious problem that is an emergency. Do not wait to see if the symptoms will go away. Get medical help right away. Call your local emergency services (911 in the [place] Do not drive yourself to the hospital. Summary  Sleep apnea is a condition in which breathing pauses or becomes shallow during sleep.  The most common cause is a collapsed or blocked airway.  The goal of treatment is to restore normal breathing and to ease symptoms during sleep. This information is not intended to replace advice given to you by your health care provider. Make sure you discuss any questions you have with your health care provider. Document Revised: 06/09/2018 Document  Reviewed: 08/18/2017 Elsevier Patient Education  [place].

## 2019-05-02 ENCOUNTER — Encounter: Payer: Self-pay | Admitting: Family Medicine

## 2019-05-03 NOTE — Telephone Encounter (Signed)
See results note. 

## 2019-05-18 ENCOUNTER — Other Ambulatory Visit: Payer: Self-pay

## 2019-05-18 ENCOUNTER — Ambulatory Visit: Payer: 59

## 2019-05-18 DIAGNOSIS — G4733 Obstructive sleep apnea (adult) (pediatric): Secondary | ICD-10-CM

## 2019-05-20 ENCOUNTER — Telehealth: Payer: Self-pay | Admitting: *Deleted

## 2019-05-20 ENCOUNTER — Telehealth: Payer: Self-pay | Admitting: Pulmonary Disease

## 2019-05-20 DIAGNOSIS — G4733 Obstructive sleep apnea (adult) (pediatric): Secondary | ICD-10-CM

## 2019-05-20 NOTE — Telephone Encounter (Signed)
Call patient  Sleep study result  Date of study: 05/18/2019   Impression: Mild obstructive sleep apnea Mild oxygen desaturations  Recommendation: Recommend CPAP therapy for mild obstructive sleep apnea in the context of significant cardiovascular risk profile and excessive daytime sleepiness Auto titrating CPAP with pressure settings of 5-15 will be appropriate  Follow-up in the office 4 to 6 weeks following initiation of treatment

## 2019-05-23 NOTE — Telephone Encounter (Signed)
Attempted to call patient on 5/14 and 5/17, left message, sleep study in Dr. Judson Roch box.

## 2019-05-25 ENCOUNTER — Other Ambulatory Visit: Payer: Self-pay | Admitting: Family Medicine

## 2019-05-25 DIAGNOSIS — I499 Cardiac arrhythmia, unspecified: Secondary | ICD-10-CM

## 2019-05-25 DIAGNOSIS — R000 Tachycardia, unspecified: Secondary | ICD-10-CM

## 2019-05-25 DIAGNOSIS — I495 Sick sinus syndrome: Secondary | ICD-10-CM

## 2019-05-26 NOTE — Telephone Encounter (Signed)
Called and left message on 5/20, call # 3, will hold sleep study in Dr. Judson Roch box until contact is made.

## 2019-05-27 NOTE — Telephone Encounter (Signed)
Pt returning call.  [phone].

## 2019-06-07 NOTE — Telephone Encounter (Signed)
Attempted to call patient for third time, sleep study has been sent to scan into chart. Will wait to hear back from patient.

## 2019-06-07 NOTE — Telephone Encounter (Signed)
Pt returning a phone call. Pt can be reached at [phone]

## 2019-06-27 NOTE — Telephone Encounter (Signed)
Called patient again to provide home sleep study results, message left. See alternate phone encounter.

## 2019-06-30 ENCOUNTER — Ambulatory Visit: Payer: 59 | Admitting: Cardiology

## 2019-07-12 NOTE — Progress Notes (Signed)
Cardiology Office Note:    Date:  07/13/2019   ID:  Lonni Fix, DOB Aug 22, 1965, MRN [id number]  PCP:  Caren Macadam, MD  Cardiologist:  No primary care provider on file.  Electrophysiologist:  None   Referring MD: Caren Macadam, MD   Chief Complaint  Patient presents with  . Heart Problem    History of Present Illness:    Kara Mierzejewski is a 54 y.o. male with a hx of recurrent cellulitis who presents for follow-up.  He was referred by Dr. Ethlyn Gallery for evaluation of sinus pauses, initially seen on 04/22/2019.  He had been recently hospitalized in [place].  Was in [place] for vacation.  Developed right lower extremity cellulitis.  Admitted with sepsis.  Per records had a 6-second sinus pause on 4/1.  TTE was done and was unremarkable.  Subsequently had a 6.6-second pause during the night of 4/3.  Cardiology consulted and recommended 30-day event monitor, cardiology follow-up, and sleep study.  He was discharged on a course of linezolid and amoxicillin.  He denies any episodes of lightheadedness or syncope.  Does report some dyspnea on exertion, but denies any chest pain.  States that he walks 1-2 times per week for 20 to 30 minutes.  Has also intermittently playing pickup basketball.  Takes Lasix 80 mg daily for chronic right lower extremity edema.  30-day event monitor on 05/25/2019 showed no sinus pauses, one 6 beat episode of NSVT.  Only 6 days of data were recorded on 30-day monitor.  Since last clinic visit, he reports that he has been doing well.  He denies any palpitations, lightheadedness, or syncope.  He reports that he has not been exercising.  Denies any chest pain or dyspnea.  His sleep study was positive for OSA, but he has not started CPAP yet.      Past Medical History:  Diagnosis Date  . Allergic drug rash   . Anal fissure    "lanced it w/my hemorrhoids"  . Cancer of skin of back    "don't know which kind"  . Cellulitis    recurrent cellulitis right  foot-per patient-per patient in hospital for IV antibiotics 01/2012  . Cellulitis of right lower extremity hospitalized 12/21/2014  . Edema    LE, R>L since child hood  . Erythema nodosum   . Hemorrhoids   . Hx of adenomatous polyp of colon 10/06/2018  . Sepsis ([place]) 05/02/2016  . Sickle cell trait ([place])   . Vasculitis of skin     Past Surgical History:  Procedure Laterality Date  . [place]   "had them lanced; also lanced anal fissure"  . PENIS REVASCULARIZATION SURGERY    . UMBILICAL HERNIA REPAIR  12/24/2007   with Proceed ventral    Current Medications: Current Meds  Medication Sig  . furosemide (LASIX) 40 MG tablet Take 80 mg by mouth.   Marland Kitchen ibuprofen (ADVIL) 800 MG tablet Take 800 mg by mouth every 8 (eight) hours as needed.     Allergies:   Patient has no known allergies.   Social History   Socioeconomic History  . Marital status: Married    Spouse name: Not on file  . Number of children: Not on file  . Years of education: Not on file  . Highest education level: Not on file  Occupational History  . Occupation: [employer] SUPPLY    Employer: [employer]  Tobacco Use  . Smoking status: Never Smoker  . Smokeless tobacco: Never Used  Vaping Use  . Vaping Use: Never used  Substance and Sexual Activity  . Alcohol use: Yes    Alcohol/week: 6.0 standard drinks    Types: 6 Cans of beer per week  . Drug use: Yes    Types: Marijuana    Comment: 12/21/2014 "couple times q 3-4 months"  . Sexual activity: Yes  Other Topics Concern  . Not on file  Social History Narrative   Work or School: Insurance account manager Situation: lives with wife and 41 yo twins in 2016      Spiritual Beliefs: non practicing Christian      Lifestyle: started exercise and diet changes in summer 2016      Social Determinants of Health   Financial Resource Strain:   . Difficulty of Paying Living Expenses:   Food Insecurity:   . Worried About Charity fundraiser in the  Last Year:   . Arboriculturist in the Last Year:   Transportation Needs:   . Film/video editor (Medical):   Marland Kitchen Lack of Transportation (Non-Medical):   Physical Activity:   . Days of Exercise per Week:   . Minutes of Exercise per Session:   Stress:   . Feeling of Stress :   Social Connections:   . Frequency of Communication with Friends and Family:   . Frequency of Social Gatherings with Friends and Family:   . Attends Religious Services:   . Active Member of Clubs or Organizations:   . Attends Archivist Meetings:   Marland Kitchen Marital Status:      Family History: The patient's family history includes COPD in his paternal uncle; Colon cancer in his maternal uncle; Dementia in his maternal grandmother; Heart attack in his maternal grandfather; Heart disease in his paternal grandfather, paternal grandmother, and another family member; Prostate cancer in his father and maternal uncle; Skin cancer in his mother and paternal grandfather; Stroke in his maternal aunt; Stroke (age of onset: 34) in his paternal grandfather and paternal grandmother. There is no history of Esophageal cancer, Rectal cancer, or Stomach cancer.  ROS:   Please see the history of present illness.     All other systems reviewed and are negative.  EKGs/Labs/Other Studies Reviewed:    The following studies were reviewed today:  EKG:  EKG is not ordered today.  The ekg ordered at prior clinic visit demonstrates normal sinus rhythm, rate 61, T wave inversion in leads III, aVF  TTE 3/32/21 ([facility]): Summary  Technically difficult study.  Spectral Doppler and Color Flow Velocity Mapping were used to interrogate  the valves and cardiac structures.   Normal left ventricular size.  Mildly increased left ventricle wall thickness. Normal left ventricular  systolic function, ejection fraction 65%.  Normal diastolic filling pattern.  Mild right ventricular dilatation. TAPSE measures 2.9 cm, consistent with  normal  right ventricular systolic function.  Normal valves.  Recent Labs: 04/18/2019: ALT 47; BUN 19; Creatinine, Ser 0.93; Hemoglobin 14.1; Platelets 368.0; Potassium 4.6; Sodium 140; TSH 1.56  Recent Lipid Panel    Component Value Date/Time   CHOL 130 04/18/2019 0738   TRIG 148.0 04/18/2019 0738   HDL 33.70 (L) 04/18/2019 0738   CHOLHDL 4 04/18/2019 0738   VLDL 29.6 04/18/2019 0738   LDLCALC 66 04/18/2019 0738    Physical Exam:    VS:  BP (!) 136/94   Pulse 84   Temp (!) 96.8 F (36 C)   Ht 6\' 1"  (  1.854 m)   Wt 289 lb (131.1 kg)   SpO2 97%   BMI 38.13 kg/m     Wt Readings from Last 3 Encounters:  07/13/19 289 lb (131.1 kg)  04/27/19 284 lb 6.4 oz (129 kg)  04/22/19 291 lb (132 kg)   GEN:  Well nourished, well developed in no acute distress HEENT: Normal NECK: No JVD; No carotid bruits CARDIAC: RRR, no murmurs, rubs, gallops RESPIRATORY:  Clear to auscultation without rales, wheezing or rhonchi  ABDOMEN: Soft, non-tender, non-distended MUSCULOSKELETAL:  Nonpitting RLE edema SKIN: Warm and dry NEUROLOGIC:  Alert and oriented x 3 PSYCHIATRIC:  Normal affect   ASSESSMENT:    1. Sinus pause   2. OSA (obstructive sleep apnea)   3. Elevated BP without diagnosis of hypertension    PLAN:    Sinus pauses: Up to 6.6 seconds while asleep during recent hospital admission for sepsis from right lower extremity cellulitis.  TTE unremarkable.  No symptoms to suggest significant sinus pauses while awake.  30-day event monitor on 05/25/2019 showed no sinus pauses, one 6 beat episode of NSVT.  Only 6 days of data were recorded on 30-day monitor, as patient states he had difficulty with [employer] monitor.  Sleep study positive for OSA, he is being started on CPAP.  Pauses may have been related to OSA.  Given incomplete prior monitor and considering that he will be starting CPAP soon, recommend repeating monitor while on CPAP.  Will plan Zio patch x2 weeks once he starts CPAP.  OSA:  starting on CPAP  Elevated BP: Initial BP elevated in clinic today at 136/94, was 110/80 on recheck.  Recommended patient obtain BP monitor and check BP daily for next week and call with results.  RTC in 6 months  Medication Adjustments/Labs and Tests Ordered: Current medicines are reviewed at length with the patient today.  Concerns regarding medicines are outlined above.  No orders of the defined types were placed in this encounter.  No orders of the defined types were placed in this encounter.   Patient Instructions  Medication Instructions:  Your physician recommends that you continue on your current medications as directed. Please refer to the Current Medication list given to you today.  Testing/Procedures: PLEASE CALL TO LET us KNOW WHEN YOU START CPAP, then we will order:  ZIO XT- Long Term Monitor Instructions   Your physician has requested you wear your ZIO patch monitor 14 days.   This is a single patch monitor.  Irhythm supplies one patch monitor per enrollment.  Additional stickers are not available.   Please do not apply patch if you will be having a Nuclear Stress Test, Echocardiogram, Cardiac CT, MRI, or Chest Xray during the time frame you would be wearing the monitor. The patch cannot be worn during these tests.  You cannot remove and re-apply the ZIO XT patch monitor.   Your ZIO patch monitor will be sent USPS Priority mail from [facility] directly to your home address. The monitor may also be mailed to a PO BOX if home delivery is not available.   It may take 3-5 days to receive your monitor after you have been enrolled.   Once you have received you monitor, please review enclosed instructions.  Your monitor has already been registered assigning a specific monitor serial # to you.   Applying the monitor   Shave hair from upper left chest.   Hold abrader disc by orange tab.  Rub abrader in 40 strokes over left  upper chest as indicated in your monitor  instructions.   Clean area with 4 enclosed alcohol pads .  Use all pads to assure are is cleaned thoroughly.  Let dry.   Apply patch as indicated in monitor instructions.  Patch will be place under collarbone on left side of chest with arrow pointing upward.   Rub patch adhesive wings for 2 minutes.Remove white label marked "1".  Remove white label marked "2".  Rub patch adhesive wings for 2 additional minutes.   While looking in a mirror, press and release button in center of patch.  A small green light will flash 3-4 times .  This will be your only indicator the monitor has been turned on.     Do not shower for the first 24 hours.  You may shower after the first 24 hours.   Press button if you feel a symptom. You will hear a small click.  Record Date, Time and Symptom in the Patient Log Book.   When you are ready to remove patch, follow instructions on last 2 pages of Patient Log Book.  Stick patch monitor onto last page of Patient Log Book.   Place Patient Log Book in [place] box.  Use locking tab on box and tape box closed securely.  The Orange and [employer] has [employer] on it.  Please place in mailbox as soon as possible.  Your physician should have your test results approximately 7 days after the monitor has been mailed back to [facility].   Call [facility] at [phone] if you have questions regarding your ZIO XT patch monitor.  Call them immediately if you see an orange light blinking on your monitor.   If your monitor falls off in less than 4 days contact our Monitor department at [phone].  If your monitor becomes loose or falls off after 4 days call [employer] at [phone] for suggestions on securing your monitor.    Follow-Up: At [facility], you and your health needs are our priority.  As part of our continuing mission to provide you with exceptional heart care, we have created designated Provider Care Teams.  These Care Teams include your  primary Cardiologist (physician) and Advanced Practice Providers (APPs -  Physician Assistants and Nurse Practitioners) who all work together to provide you with the care you need, when you need it.  We recommend signing up for the patient portal called "MyChart".  Sign up information is provided on this After Visit Summary.  MyChart is used to connect with patients for Virtual Visits (Telemedicine).  Patients are able to view lab/test results, encounter notes, upcoming appointments, etc.  Non-urgent messages can be sent to your provider as well.   To learn more about what you can do with MyChart, go to [email or web address].    Your next appointment:   6 month(s)  The format for your next appointment:   In Person  Provider:   Oswaldo Milian, MD   Other Instructions We recommend getting a blood pressure cuff for home (Omron is a recommended brand).   Please check your blood pressure at home daily, write it down.  Call the office or send message via Mychart with the readings in 1 week for Dr. Gardiner Rhyme to review.       Signed, Donato Heinz, MD  07/13/2019 8:56 AM    [place]

## 2019-07-13 ENCOUNTER — Other Ambulatory Visit: Payer: Self-pay

## 2019-07-13 ENCOUNTER — Ambulatory Visit: Payer: 59 | Admitting: Cardiology

## 2019-07-13 ENCOUNTER — Encounter: Payer: Self-pay | Admitting: Cardiology

## 2019-07-13 VITALS — BP 136/94 | HR 84 | Temp 96.8°F | Ht 73.0 in | Wt 289.0 lb

## 2019-07-13 DIAGNOSIS — G4733 Obstructive sleep apnea (adult) (pediatric): Secondary | ICD-10-CM

## 2019-07-13 DIAGNOSIS — R030 Elevated blood-pressure reading, without diagnosis of hypertension: Secondary | ICD-10-CM

## 2019-07-13 DIAGNOSIS — I455 Other specified heart block: Secondary | ICD-10-CM

## 2019-07-13 NOTE — Patient Instructions (Signed)
Medication Instructions:  Your physician recommends that you continue on your current medications as directed. Please refer to the Current Medication list given to you today.  Testing/Procedures: PLEASE CALL TO LET us KNOW WHEN YOU START CPAP, then we will order:  ZIO XT- Long Term Monitor Instructions   Your physician has requested you wear your ZIO patch monitor 14 days.   This is a single patch monitor.  Irhythm supplies one patch monitor per enrollment.  Additional stickers are not available.   Please do not apply patch if you will be having a Nuclear Stress Test, Echocardiogram, Cardiac CT, MRI, or Chest Xray during the time frame you would be wearing the monitor. The patch cannot be worn during these tests.  You cannot remove and re-apply the ZIO XT patch monitor.   Your ZIO patch monitor will be sent USPS Priority mail from [facility] directly to your home address. The monitor may also be mailed to a PO BOX if home delivery is not available.   It may take 3-5 days to receive your monitor after you have been enrolled.   Once you have received you monitor, please review enclosed instructions.  Your monitor has already been registered assigning a specific monitor serial # to you.   Applying the monitor   Shave hair from upper left chest.   Hold abrader disc by orange tab.  Rub abrader in 40 strokes over left upper chest as indicated in your monitor instructions.   Clean area with 4 enclosed alcohol pads .  Use all pads to assure are is cleaned thoroughly.  Let dry.   Apply patch as indicated in monitor instructions.  Patch will be place under collarbone on left side of chest with arrow pointing upward.   Rub patch adhesive wings for 2 minutes.Remove white label marked "1".  Remove white label marked "2".  Rub patch adhesive wings for 2 additional minutes.   While looking in a mirror, press and release button in center of patch.  A small green light will flash 3-4 times .   This will be your only indicator the monitor has been turned on.     Do not shower for the first 24 hours.  You may shower after the first 24 hours.   Press button if you feel a symptom. You will hear a small click.  Record Date, Time and Symptom in the Patient Log Book.   When you are ready to remove patch, follow instructions on last 2 pages of Patient Log Book.  Stick patch monitor onto last page of Patient Log Book.   Place Patient Log Book in [place] box.  Use locking tab on box and tape box closed securely.  The Orange and [employer] has [employer] on it.  Please place in mailbox as soon as possible.  Your physician should have your test results approximately 7 days after the monitor has been mailed back to [facility].   Call [place] at [phone] if you have questions regarding your ZIO XT patch monitor.  Call them immediately if you see an orange light blinking on your monitor.   If your monitor falls off in less than 4 days contact our Monitor department at [phone].  If your monitor becomes loose or falls off after 4 days call [employer] at [phone] for suggestions on securing your monitor.    Follow-Up: At [facility], you and your health needs are our priority.  As part of our continuing mission  to provide you with exceptional heart care, we have created designated Provider Care Teams.  These Care Teams include your primary Cardiologist (physician) and Advanced Practice Providers (APPs -  Physician Assistants and Nurse Practitioners) who all work together to provide you with the care you need, when you need it.  We recommend signing up for the patient portal called "MyChart".  Sign up information is provided on this After Visit Summary.  MyChart is used to connect with patients for Virtual Visits (Telemedicine).  Patients are able to view lab/test results, encounter notes, upcoming appointments, etc.  Non-urgent messages can be sent to your  provider as well.   To learn more about what you can do with MyChart, go to [email or web address].    Your next appointment:   6 month(s)  The format for your next appointment:   In Person  Provider:   Oswaldo Milian, MD   Other Instructions We recommend getting a blood pressure cuff for home (Omron is a recommended brand).   Please check your blood pressure at home daily, write it down.  Call the office or send message via Mychart with the readings in 1 week for Dr. Gardiner Rhyme to review.

## 2020-01-09 NOTE — Progress Notes (Deleted)
Cardiology Office Note:    Date:  01/09/2020   ID:  Benjamin Mcdowell, DOB 1965/08/03, MRN [id number]  PCP:  Wynn Banker, MD  Cardiologist:  No primary care provider on file.  Electrophysiologist:  None   Referring MD: Wynn Banker, MD   No chief complaint on file.   History of Present Illness:    Benjamin Mcdowell is a 55 y.o. male with a hx of recurrent cellulitis who presents for follow-up.  He was referred by Dr. Hassan Rowan for evaluation of sinus pauses, initially seen on 04/22/2019.  He had been recently hospitalized in [place].  Was in [place] for vacation.  Developed right lower extremity cellulitis.  Admitted with sepsis.  Per records had a 6-second sinus pause on 4/1.  TTE was done and was unremarkable.  Subsequently had a 6.6-second pause during the night of 4/3.  Cardiology consulted and recommended 30-day event monitor, cardiology follow-up, and sleep study.  He was discharged on a course of linezolid and amoxicillin.  He denies any episodes of lightheadedness or syncope.  Does report some dyspnea on exertion, but denies any chest pain.  States that he walks 1-2 times per week for 20 to 30 minutes.  Has also intermittently playing pickup basketball.  Takes Lasix 80 mg daily for chronic right lower extremity edema.  30-day event monitor on 05/25/2019 showed no sinus pauses, one 6 beat episode of NSVT.  Only 6 days of data were recorded on 30-day monitor.  Since last clinic visit,   he reports that he has been doing well.  He denies any palpitations, lightheadedness, or syncope.  He reports that he has not been exercising.  Denies any chest pain or dyspnea.  His sleep study was positive for OSA, but he has not started CPAP yet.      Past Medical History:  Diagnosis Date  . Allergic drug rash   . Anal fissure    "lanced it w/my hemorrhoids"  . Cancer of skin of back    "don't know which kind"  . Cellulitis    recurrent cellulitis right foot-per patient-per patient in  hospital for IV antibiotics 01/2012  . Cellulitis of right lower extremity hospitalized 12/21/2014  . Edema    LE, R>L since child hood  . Erythema nodosum   . Hemorrhoids   . Hx of adenomatous polyp of colon 10/06/2018  . Sepsis (HCC) 05/02/2016  . Sickle cell trait (HCC)   . Vasculitis of skin     Past Surgical History:  Procedure Laterality Date  . HEMORRHOID SURGERY  1985   "had them lanced; also lanced anal fissure"  . PENIS REVASCULARIZATION SURGERY    . UMBILICAL HERNIA REPAIR  12/24/2007   with Proceed ventral    Current Medications: No outpatient medications have been marked as taking for the 01/13/20 encounter (Appointment) with Little Ishikawa, MD.     Allergies:   Patient has no known allergies.   Social History   Socioeconomic History  . Marital status: Married    Spouse name: Not on file  . Number of children: Not on file  . Years of education: Not on file  . Highest education level: Not on file  Occupational History  . Occupation: [employer] SUPPLY    Employer: [employer] G[employer]  Tobacco Use  . Smoking status: Never Smoker  . Smokeless tobacco: Never Used  Vaping Use  . Vaping Use: Never used  Substance and Sexual Activity  . Alcohol use: Yes    Alcohol/week:  6.0 standard drinks    Types: 6 Cans of beer per week  . Drug use: Yes    Types: Marijuana    Comment: 12/21/2014 "couple times q 3-4 months"  . Sexual activity: Yes  Other Topics Concern  . Not on file  Social History Narrative   Work or School: Insurance account manager Situation: lives with wife and 33 yo twins in 2016      Spiritual Beliefs: non practicing Christian      Lifestyle: started exercise and diet changes in summer 2016      Social Determinants of Health   Financial Resource Strain: Not on file  Food Insecurity: Not on file  Transportation Needs: Not on file  Physical Activity: Not on file  Stress: Not on file  Social Connections: Not on file     Family  History: The patient's family history includes COPD in his paternal uncle; Colon cancer in his maternal uncle; Dementia in his maternal grandmother; Heart attack in his maternal grandfather; Heart disease in his paternal grandfather, paternal grandmother, and another family member; Prostate cancer in his father and maternal uncle; Skin cancer in his mother and paternal grandfather; Stroke in his maternal aunt; Stroke (age of onset: 34) in his paternal grandfather and paternal grandmother. There is no history of Esophageal cancer, Rectal cancer, or Stomach cancer.  ROS:   Please see the history of present illness.     All other systems reviewed and are negative.  EKGs/Labs/Other Studies Reviewed:    The following studies were reviewed today:  EKG:  EKG is not ordered today.  The ekg ordered at prior clinic visit demonstrates normal sinus rhythm, rate 61, T wave inversion in leads III, aVF  TTE 3/32/21 (OSH): Summary  Technically difficult study.  Spectral Doppler and Color Flow Velocity Mapping were used to interrogate  the valves and cardiac structures.   Normal left ventricular size.  Mildly increased left ventricle wall thickness. Normal left ventricular  systolic function, ejection fraction 65%.  Normal diastolic filling pattern.  Mild right ventricular dilatation. TAPSE measures 2.9 cm, consistent with  normal right ventricular systolic function.  Normal valves.  Recent Labs: 04/18/2019: ALT 47; BUN 19; Creatinine, Ser 0.93; Hemoglobin 14.1; Platelets 368.0; Potassium 4.6; Sodium 140; TSH 1.56  Recent Lipid Panel    Component Value Date/Time   CHOL 130 04/18/2019 0738   TRIG 148.0 04/18/2019 0738   HDL 33.70 (L) 04/18/2019 0738   CHOLHDL 4 04/18/2019 0738   VLDL 29.6 04/18/2019 0738   LDLCALC 66 04/18/2019 0738    Physical Exam:    VS:  There were no vitals taken for this visit.    Wt Readings from Last 3 Encounters:  07/13/19 289 lb (131.1 kg)  04/27/19 284 lb 6.4 oz  (129 kg)  04/22/19 291 lb (132 kg)   GEN:  Well nourished, well developed in no acute distress HEENT: Normal NECK: No JVD; No carotid bruits CARDIAC: RRR, no murmurs, rubs, gallops RESPIRATORY:  Clear to auscultation without rales, wheezing or rhonchi  ABDOMEN: Soft, non-tender, non-distended MUSCULOSKELETAL:  Nonpitting RLE edema SKIN: Warm and dry NEUROLOGIC:  Alert and oriented x 3 PSYCHIATRIC:  Normal affect   ASSESSMENT:    No diagnosis found. PLAN:    Sinus pauses: Up to 6.6 seconds while asleep during recent hospital admission for sepsis from right lower extremity cellulitis.  TTE unremarkable.  No symptoms to suggest significant sinus pauses while awake.  30-day event monitor on  05/25/2019 showed no sinus pauses, one 6 beat episode of NSVT.  Only 6 days of data were recorded on 30-day monitor, as patient states he had difficulty with [employer] monitor.  Sleep study positive for OSA, he is being started on CPAP.  Pauses may have been related to OSA.  Given incomplete prior monitor and considering that he will be starting CPAP soon, recommend repeating monitor while on CPAP.  Will plan Zio patch x2 weeks once he starts CPAP.  OSA: starting on CPAP  Elevated BP: Initial BP elevated in clinic today at 136/94, was 110/80 on recheck.  Recommended patient obtain BP monitor and check BP daily for next week and call with results.  RTC in ***  Medication Adjustments/Labs and Tests Ordered: Current medicines are reviewed at length with the patient today.  Concerns regarding medicines are outlined above.  No orders of the defined types were placed in this encounter.  No orders of the defined types were placed in this encounter.   There are no Patient Instructions on file for this visit.   Signed, Donato Heinz, MD  01/09/2020 8:16 AM    [place]

## 2020-01-13 ENCOUNTER — Ambulatory Visit: Payer: 59 | Admitting: Cardiology

## 2020-03-15 ENCOUNTER — Encounter: Payer: Self-pay | Admitting: Cardiology

## 2020-04-14 ENCOUNTER — Encounter: Payer: Self-pay | Admitting: Pulmonary Disease

## 2020-09-03 ENCOUNTER — Other Ambulatory Visit: Payer: 59

## 2020-09-03 ENCOUNTER — Other Ambulatory Visit: Payer: Self-pay

## 2020-09-03 ENCOUNTER — Ambulatory Visit (INDEPENDENT_AMBULATORY_CARE_PROVIDER_SITE_OTHER): Payer: 59 | Admitting: Family Medicine

## 2020-09-03 ENCOUNTER — Encounter: Payer: Self-pay | Admitting: Family Medicine

## 2020-09-03 VITALS — BP 130/80 | HR 66 | Temp 98.2°F | Ht 71.5 in | Wt 311.8 lb

## 2020-09-03 DIAGNOSIS — Z0000 Encounter for general adult medical examination without abnormal findings: Secondary | ICD-10-CM

## 2020-09-03 DIAGNOSIS — L409 Psoriasis, unspecified: Secondary | ICD-10-CM | POA: Diagnosis not present

## 2020-09-03 DIAGNOSIS — R739 Hyperglycemia, unspecified: Secondary | ICD-10-CM

## 2020-09-03 DIAGNOSIS — E781 Pure hyperglyceridemia: Secondary | ICD-10-CM | POA: Diagnosis not present

## 2020-09-03 DIAGNOSIS — R600 Localized edema: Secondary | ICD-10-CM

## 2020-09-03 DIAGNOSIS — R030 Elevated blood-pressure reading, without diagnosis of hypertension: Secondary | ICD-10-CM

## 2020-09-03 DIAGNOSIS — Z6841 Body Mass Index (BMI) 40.0 and over, adult: Secondary | ICD-10-CM

## 2020-09-03 DIAGNOSIS — Z13220 Encounter for screening for lipoid disorders: Secondary | ICD-10-CM | POA: Diagnosis not present

## 2020-09-03 LAB — LIPID PANEL
Cholesterol: 174 mg/dL (ref 0–200)
HDL: 38.1 mg/dL — ABNORMAL LOW (ref >39.00)
NonHDL: 136.07
Total CHOL/HDL Ratio: 5
Triglycerides: 209 mg/dL — ABNORMAL HIGH (ref 0.0–149.0)
VLDL: 41.8 mg/dL — ABNORMAL HIGH (ref 0.0–40.0)

## 2020-09-03 LAB — TSH: TSH: 2.13 u[IU]/mL (ref 0.35–5.50)

## 2020-09-03 LAB — COMPREHENSIVE METABOLIC PANEL
ALT: 99 U/L — ABNORMAL HIGH (ref 0–53)
AST: 39 U/L — ABNORMAL HIGH (ref 0–37)
Albumin: 4.2 g/dL (ref 3.5–5.2)
Alkaline Phosphatase: 64 U/L (ref 39–117)
BUN: 12 mg/dL (ref 6–23)
CO2: 29 mEq/L (ref 19–32)
Calcium: 9.4 mg/dL (ref 8.4–10.5)
Chloride: 102 mEq/L (ref 96–112)
Creatinine, Ser: 0.98 mg/dL (ref 0.40–1.50)
GFR: 87.09 mL/min (ref >60.00)
Glucose, Bld: 148 mg/dL — ABNORMAL HIGH (ref 70–99)
Potassium: 4.6 mEq/L (ref 3.5–5.1)
Sodium: 140 mEq/L (ref 135–145)
Total Bilirubin: 0.8 mg/dL (ref 0.2–1.2)
Total Protein: 6.6 g/dL (ref 6.0–8.3)

## 2020-09-03 LAB — CBC WITH DIFFERENTIAL/PLATELET
Basophils Absolute: 0 10*3/uL (ref 0.0–0.1)
Basophils Relative: 0.5 % (ref 0.0–3.0)
Eosinophils Absolute: 0.2 10*3/uL (ref 0.0–0.7)
Eosinophils Relative: 2.6 % (ref 0.0–5.0)
HCT: 44.9 % (ref 39.0–52.0)
Hemoglobin: 15.1 g/dL (ref 13.0–17.0)
Lymphocytes Relative: 24.3 % (ref 12.0–46.0)
Lymphs Abs: 1.8 10*3/uL (ref 0.7–4.0)
MCHC: 33.8 g/dL (ref 30.0–36.0)
MCV: 84.2 fl (ref 78.0–100.0)
Monocytes Absolute: 0.6 10*3/uL (ref 0.1–1.0)
Monocytes Relative: 7.9 % (ref 3.0–12.0)
Neutro Abs: 4.9 10*3/uL (ref 1.4–7.7)
Neutrophils Relative %: 64.7 % (ref 43.0–77.0)
Platelets: 232 10*3/uL (ref 150.0–400.0)
RBC: 5.33 Mil/uL (ref 4.22–5.81)
RDW: 13.5 % (ref 11.5–15.5)
WBC: 7.6 10*3/uL (ref 4.0–10.5)

## 2020-09-03 LAB — HEMOGLOBIN A1C: Hgb A1c MFr Bld: 6.9 % — ABNORMAL HIGH (ref 4.6–6.5)

## 2020-09-03 LAB — LDL CHOLESTEROL, DIRECT: Direct LDL: 99 mg/dL

## 2020-09-03 MED ORDER — TRIAMCINOLONE ACETONIDE 0.5 % EX OINT: 1.0000 "application " | TOPICAL_OINTMENT | Freq: Two times a day (BID) | CUTANEOUS | 2 refills | Status: DC

## 2020-09-03 NOTE — Progress Notes (Signed)
Benjamin Mcdowell DOB: [birth year] Encounter date: 09/03/2020  This is a 55 y.o. male who presents for complete physical   History of present illness/Additional concerns:  Has followed with cardiology after having sinus pauses and hospitalization while in [place] on vacation. 30 day event monitor done 05/25/19 with no sinus pause; one 6 beat episode NSVT. Only 6 days recorded for 30 day monitor. Plan is to do Zio patch x 2 weeks after starting CPAP. He has not started CPAP because he didn't really trust the at home study and wanted to repeat this. Hasn't set this up yet. Feeling well from heart standpoint. Had issues with getting the monitor for heart - might have been reading issue/sticking; he doesn't remember.   Does snore; no one saying he stops breathing. He feels pretty good/rested in the morning. He usually falls asleep early on the couch and then is up a couple of hours; then usually goes back to sleep. Feels like he is a good sleeper.   Increased stress, grazing on food is what he contributes to weight gain. Portion control. Drinks coffee, unsweet tea. Not getting any exercise.    Repeat colonoscopy due 09/2025 Was following with [facility] dermatology; states will make appointment  Past Medical History:  Diagnosis Date   Allergic drug rash    Anal fissure    "lanced it w/my hemorrhoids"   Cancer of skin of back    "don't know which kind"   Cellulitis    recurrent cellulitis right foot-per patient-per patient in hospital for IV antibiotics 01/2012   Cellulitis of right lower extremity hospitalized 12/21/2014   Edema    LE, R>L since child hood   Erythema nodosum    Hemorrhoids    Hx of adenomatous polyp of colon 10/06/2018   Sepsis ([place]) 05/02/2016   Sickle cell trait ([place])    Vasculitis of skin    Past Surgical History:  Procedure Laterality Date   [place]   "had them lanced; also lanced anal fissure"   [place]  12/24/2007   with Proceed ventral   No Known Allergies Current Meds  Medication Sig   furosemide (LASIX) 40 MG tablet Take 80 mg by mouth.    triamcinolone ointment (KENALOG) 0.5 % Apply 1 application topically 2 (two) times daily.   [DISCONTINUED] ibuprofen (ADVIL) 800 MG tablet Take 800 mg by mouth every 8 (eight) hours as needed.   Social History   Tobacco Use   Smoking status: Never   Smokeless tobacco: Never  Substance Use Topics   Alcohol use: Yes    Alcohol/week: 6.0 standard drinks    Types: 6 Cans of beer per week   Family History  Problem Relation Age of Onset   Prostate cancer Father    Skin cancer Mother    Heart disease Other        GRANDFATHER   Dementia Maternal Grandmother    Heart attack Maternal Grandfather    Stroke Paternal Grandmother 78   Heart disease Paternal Grandmother    Skin cancer Paternal Grandfather    Stroke Paternal Grandfather 59   Heart disease Paternal Grandfather    Stroke Maternal Aunt    Prostate cancer Maternal Uncle        ?specifics unknown   Colon cancer Maternal Uncle    COPD Paternal Uncle    Esophageal cancer Neg Hx    Rectal cancer Neg Hx    Stomach cancer Neg  Hx      Review of Systems  Constitutional:  Negative for activity change, appetite change, chills, fatigue, fever and unexpected weight change.  HENT:  Negative for congestion, ear pain, hearing loss, sinus pressure, sinus pain, sore throat and trouble swallowing.   Eyes:  Negative for pain and visual disturbance.  Respiratory:  Negative for cough, chest tightness, shortness of breath and wheezing.   Cardiovascular:  Positive for leg swelling. Negative for chest pain and palpitations.  Gastrointestinal:  Negative for abdominal distention, abdominal pain, blood in stool, constipation, diarrhea, nausea and vomiting.  Genitourinary:  Negative for decreased urine volume, difficulty urinating, dysuria, penile pain and testicular pain.  Musculoskeletal:   Negative for arthralgias, back pain and joint swelling.  Skin:  Negative for rash.  Neurological:  Negative for dizziness, weakness, numbness and headaches.  Hematological:  Negative for adenopathy. Does not bruise/bleed easily.  Psychiatric/Behavioral:  Negative for agitation, sleep disturbance and suicidal ideas. The patient is not nervous/anxious.    CBC:  Lab Results  Component Value Date   WBC 7.6 09/03/2020   HGB 15.1 09/03/2020   HCT 44.9 09/03/2020   MCH 28.4 01/18/2017   MCHC 33.8 09/03/2020   RDW 13.5 09/03/2020   PLT 232.0 09/03/2020   CMP: Lab Results  Component Value Date   NA 140 09/03/2020   K 4.6 09/03/2020   CL 102 09/03/2020   CO2 29 09/03/2020   ANIONGAP 10 01/18/2017   GLUCOSE 148 (H) 09/03/2020   BUN 12 09/03/2020   CREATININE 0.98 09/03/2020   GFRAA >60 01/18/2017   CALCIUM 9.4 09/03/2020   PROT 6.6 09/03/2020   BILITOT 0.8 09/03/2020   ALKPHOS 64 09/03/2020   ALT 99 (H) 09/03/2020   AST 39 (H) 09/03/2020   LIPID: Lab Results  Component Value Date   CHOL 174 09/03/2020   TRIG 209.0 (H) 09/03/2020   HDL 38.10 (L) 09/03/2020   LDLCALC 66 04/18/2019    Objective:  BP 130/80 (BP Location: Left Arm, Patient Position: Sitting, Cuff Size: Large)   Pulse 66   Temp 98.2 F (36.8 C) (Oral)   Ht 5' 11.5" (1.816 m)   Wt (!) 311 lb 12.8 oz (141.4 kg)   SpO2 95%   BMI 42.88 kg/m   Weight: (!) 311 lb 12.8 oz (141.4 kg)   BP Readings from Last 3 Encounters:  09/03/20 130/80  07/13/19 (!) 136/94  04/27/19 108/70   Wt Readings from Last 3 Encounters:  09/03/20 (!) 311 lb 12.8 oz (141.4 kg)  07/13/19 289 lb (131.1 kg)  04/27/19 284 lb 6.4 oz (129 kg)    Physical Exam Constitutional:      General: He is not in acute distress.    Appearance: He is well-developed. He is obese.  HENT:     Head: Normocephalic and atraumatic.     Right Ear: External ear normal.     Left Ear: External ear normal.     Nose: Nose normal.     Mouth/Throat:      Pharynx: No oropharyngeal exudate.  Eyes:     Conjunctiva/sclera: Conjunctivae normal.     Pupils: Pupils are equal, round, and reactive to light.  Neck:     Thyroid: No thyromegaly.  Cardiovascular:     Rate and Rhythm: Normal rate and regular rhythm.     Heart sounds: Normal heart sounds. No murmur heard.   No friction rub. No gallop.  Pulmonary:     Effort: Pulmonary effort is normal. No respiratory  distress.     Breath sounds: Normal breath sounds. No stridor. No wheezing or rales.  Abdominal:     General: Abdomen is protuberant. Bowel sounds are normal.     Palpations: Abdomen is soft.  Musculoskeletal:        General: Normal range of motion.     Cervical back: Neck supple.     Right lower leg: 3+ Edema present.     Left lower leg: 2+ Edema present.  Skin:    General: Skin is warm and dry.  Neurological:     Mental Status: He is alert and oriented to person, place, and time.  Psychiatric:        Behavior: Behavior normal.        Thought Content: Thought content normal.        Judgment: Judgment normal.    Assessment/Plan: Health Maintenance Due  Topic Date Due   COVID-19 Vaccine (1) Never done   Zoster Vaccines- Shingrix (1 of 2) Never done   INFLUENZA VACCINE  08/06/2020   Health Maintenance reviewed.   1. Preventative health care Get back to regular exercise.   2. High triglycerides - TSH; Future  3. Hyperglycemia - Comprehensive metabolic panel; Future - Hemoglobin A1c; Future  4. Psoriasis We are going to try triamcinolone 5% on elbows bilat.  5. Lipid screening - Lipid panel; Future  6. Elevated blood pressure reading We discussed that untreated sleep apnea could affect blood pressure as well as putting strain on heart/lungs. We discussed benefit of sleep apnea treatment. We discussed home monitoring for bp.  - CBC with Differential/Platelet; Future  7. Class 3 severe obesity due to excess calories with serious comorbidity and body mass index  (BMI) of 40.0 to 44.9 in adult [facility]) He is going to work on portion control and getting some regular exercise.  8. CHRONIC Edema of right lower extremity He does well if he compresses leg. We discussed limiting sodium, elevating legs when sitting.     Return in about 6 months (around 03/05/2021) for Chronic condition visit.  Micheline Rough, MD

## 2020-09-03 NOTE — Patient Instructions (Addendum)
*  call pulmonology for follow up regarding repeating sleep evaluation.  *schedule routine skin check with dermatology

## 2020-09-07 ENCOUNTER — Encounter: Payer: Self-pay | Admitting: Family Medicine

## 2020-09-07 ENCOUNTER — Telehealth (INDEPENDENT_AMBULATORY_CARE_PROVIDER_SITE_OTHER): Payer: 59 | Admitting: Family Medicine

## 2020-09-07 DIAGNOSIS — R600 Localized edema: Secondary | ICD-10-CM

## 2020-09-07 DIAGNOSIS — R748 Abnormal levels of other serum enzymes: Secondary | ICD-10-CM | POA: Diagnosis not present

## 2020-09-07 DIAGNOSIS — E1165 Type 2 diabetes mellitus with hyperglycemia: Secondary | ICD-10-CM | POA: Diagnosis not present

## 2020-09-07 DIAGNOSIS — E7800 Pure hypercholesterolemia, unspecified: Secondary | ICD-10-CM

## 2020-09-07 MED ORDER — RYBELSUS 3 MG PO TABS: 3.0000 mg | ORAL_TABLET | Freq: Every day | ORAL | 0 refills | Status: DC

## 2020-09-07 NOTE — Progress Notes (Signed)
Virtual Visit via Video Note  I connected with Benjamin Mcdowell on 09/07/20 at 11:30 AM EDT by a video enabled telemedicine application and verified that I am speaking with the correct person using two identifiers.  Location patient: home Location provider: [place], [place] [address] Persons participating in the virtual visit: patient, provider  I discussed the limitations of evaluation and management by telemedicine and the availability of in person appointments. The patient expressed understanding and agreed to proceed.   Benjamin Mcdowell DOB: Oct 26, 1965 Encounter date: 09/07/2020  This is a 55 y.o. male who presents with Chief Complaint  Patient presents with   Results     History of present illness: Patient asked to complete virtual visit to review bloodwork in more detail since newly diabetic on recent bloodwork which changes goals of care and treatment options.   Hasn't tried the '80mg'$  of lasix, but will pick this up.   He has seen OT in [place] to work on fluid in right lower leg. Taught him how to wrap and compress leg.   Motivated to work on his eating and exercise to work on sugar control.  No Known Allergies Current Meds  Medication Sig   furosemide (LASIX) 40 MG tablet Take 80 mg by mouth.    triamcinolone ointment (KENALOG) 0.5 % Apply 1 application topically 2 (two) times daily.   Semaglutide (RYBELSUS) 3 MG TABS Take 3 mg by mouth daily.    Review of Systems  Constitutional:  Negative for chills, fatigue and fever.  Respiratory:  Negative for cough, chest tightness, shortness of breath and wheezing.   Cardiovascular:  Negative for chest pain, palpitations and leg swelling.   Objective:  There were no vitals taken for this visit.      BP Readings from Last 3 Encounters:  09/03/20 130/80  07/13/19 (!) 136/94  04/27/19 108/70   Wt Readings from Last 3 Encounters:  09/03/20 (!) 311 lb 12.8 oz (141.4 kg)  07/13/19 289  lb (131.1 kg)  04/27/19 284 lb 6.4 oz (129 kg)    EXAM:  GENERAL: alert, oriented, appears well and in no acute distress  HEENT: atraumatic, conjunctiva clear, no obvious abnormalities on inspection of external nose and ears  NECK: normal movements of the head and neck  LUNGS: on inspection no signs of respiratory distress, breathing rate appears normal, no obvious gross SOB, gasping or wheezing  CV: no obvious cyanosis  MS: moves all visible extremities without noticeable abnormality  PSYCH/NEURO: pleasant and cooperative, no obvious depression or anxiety, speech and thought processing grossly intact   Assessment/Plan  1. CHRONIC Edema of right lower extremity He is going to try lasix '80mg'$  and if this does not diurese we will try demedex. He has diagnosed vascular reflux and lymphdema right lower leg. Has seen OT in past. Will try to find out about getting him a pump to help with this (due to multiple choices; I will ask him to contact prior therapist for recommendations. Not sure process for machine approval through insurance). We discussed importance of compression, elevation, limiting salt intake.  2. Controlled type 2 diabetes mellitus with hyperglycemia, without long-term current use of insulin (HCC) We are going to add rybelsus '3mg'$  daily and increase to '7mg'$  daily after 30 days. He is going to work on portion control, decreased snacking. Ref to dietician to review with patient/wife. We discussed importance of regular exercise, activity for sugar control. Discussed new medication(s) today with patient.  Discussed potential side effects and patient verbalized understanding.  - Amb ref to Medical Nutrition Therapy-MNT  3. Elevated LDL cholesterol level Discussed LDL goal with new dx diabetes. He is hoping to improve A1C and come out of diabetic range. We will recheck lipids in 3 mo.  4. Elevated liver enzymes Discussed importance of weight loss, healthy eating (limiting processed  foods) to help with liver. Plan to recheck with other bloodwork.   Return in about 3 months (around 12/07/2020) for bloodwork prior to visit if desired.  I discussed the assessment and treatment plan with the patient. The patient was provided an opportunity to ask questions and all were answered. The patient agreed with the plan and demonstrated an understanding of the instructions.   The patient was advised to call back or seek an in-person evaluation if the symptoms worsen or if the condition fails to improve as anticipated.  I provided 30 minutes of non-face-to-face time during this encounter.   Micheline Rough, MD

## 2020-09-12 ENCOUNTER — Telehealth: Payer: Self-pay | Admitting: *Deleted

## 2020-09-12 NOTE — Telephone Encounter (Signed)
-----   Message from Caren Macadam, MD sent at 09/07/2020 12:34 PM EDT ----- Please help set up 3 mo visit - ok to set up lab visit ahead of time if desired. Please ask him to call his previous OT and ask brand she recommended/store for lymphatic compression machine at home. I cannot find this in our system so may be better if guided from her (I also don't have note from her). If she can't recommend without seeing him first; may need to go through OT again (looks like pumps are pretty pricey so I think this will be necessary from insurance standpoint).

## 2020-09-12 NOTE — Telephone Encounter (Signed)
Spoke with the patient, informed him of the message below and scheduled appts for labs and follow up.

## 2020-10-30 ENCOUNTER — Other Ambulatory Visit (HOSPITAL_BASED_OUTPATIENT_CLINIC_OR_DEPARTMENT_OTHER): Payer: Self-pay

## 2020-10-30 MED ORDER — CARESTART COVID-19 HOME TEST VI KIT
PACK | 0 refills | Status: DC
  Filled 2020-10-30: qty 4, 4d supply, fill #0

## 2020-10-31 ENCOUNTER — Other Ambulatory Visit (HOSPITAL_BASED_OUTPATIENT_CLINIC_OR_DEPARTMENT_OTHER): Payer: Self-pay

## 2020-12-14 ENCOUNTER — Other Ambulatory Visit (INDEPENDENT_AMBULATORY_CARE_PROVIDER_SITE_OTHER): Payer: 59

## 2020-12-14 DIAGNOSIS — R748 Abnormal levels of other serum enzymes: Secondary | ICD-10-CM

## 2020-12-14 DIAGNOSIS — E1165 Type 2 diabetes mellitus with hyperglycemia: Secondary | ICD-10-CM | POA: Diagnosis not present

## 2020-12-14 DIAGNOSIS — E7800 Pure hypercholesterolemia, unspecified: Secondary | ICD-10-CM | POA: Diagnosis not present

## 2020-12-14 LAB — MICROALBUMIN / CREATININE URINE RATIO
Creatinine,U: 86.7 mg/dL
Microalb Creat Ratio: 0.8 mg/g (ref 0.0–30.0)
Microalb, Ur: <0.7 mg/dL (ref 0.0–1.9)

## 2020-12-14 LAB — LIPID PANEL
Cholesterol: 158 mg/dL (ref 0–200)
HDL: 35.8 mg/dL — ABNORMAL LOW (ref >39.00)
LDL Cholesterol: 92 mg/dL (ref 0–99)
NonHDL: 122.3
Total CHOL/HDL Ratio: 4
Triglycerides: 150 mg/dL — ABNORMAL HIGH (ref 0.0–149.0)
VLDL: 30 mg/dL (ref 0.0–40.0)

## 2020-12-14 LAB — HEMOGLOBIN A1C: Hgb A1c MFr Bld: 6.6 % — ABNORMAL HIGH (ref 4.6–6.5)

## 2020-12-14 LAB — COMPREHENSIVE METABOLIC PANEL
ALT: 72 U/L — ABNORMAL HIGH (ref 0–53)
AST: 28 U/L (ref 0–37)
Albumin: 4 g/dL (ref 3.5–5.2)
Alkaline Phosphatase: 56 U/L (ref 39–117)
BUN: 12 mg/dL (ref 6–23)
CO2: 27 mEq/L (ref 19–32)
Calcium: 9 mg/dL (ref 8.4–10.5)
Chloride: 106 mEq/L (ref 96–112)
Creatinine, Ser: 0.91 mg/dL (ref 0.40–1.50)
GFR: 95 mL/min (ref >60.00)
Glucose, Bld: 127 mg/dL — ABNORMAL HIGH (ref 70–99)
Potassium: 4 mEq/L (ref 3.5–5.1)
Sodium: 141 mEq/L (ref 135–145)
Total Bilirubin: 0.6 mg/dL (ref 0.2–1.2)
Total Protein: 6.5 g/dL (ref 6.0–8.3)

## 2020-12-21 ENCOUNTER — Ambulatory Visit: Payer: 59 | Admitting: Family Medicine

## 2020-12-21 VITALS — BP 120/82 | HR 73 | Temp 98.1°F | Ht 71.5 in | Wt 306.7 lb

## 2020-12-21 DIAGNOSIS — E118 Type 2 diabetes mellitus with unspecified complications: Secondary | ICD-10-CM

## 2020-12-21 DIAGNOSIS — R600 Localized edema: Secondary | ICD-10-CM

## 2020-12-21 DIAGNOSIS — Z6841 Body Mass Index (BMI) 40.0 and over, adult: Secondary | ICD-10-CM

## 2020-12-21 MED ORDER — RYBELSUS 3 MG PO TABS: 3.0000 mg | ORAL_TABLET | Freq: Every day | ORAL | 0 refills | Status: DC

## 2020-12-21 MED ORDER — RYBELSUS 7 MG PO TABS: 7.0000 mg | ORAL_TABLET | Freq: Every day | ORAL | 0 refills | Status: DC

## 2020-12-21 MED ORDER — FUROSEMIDE 80 MG PO TABS: 80.0000 mg | ORAL_TABLET | Freq: Every day | ORAL | 3 refills | Status: DC

## 2020-12-21 NOTE — Progress Notes (Signed)
Benjamin Mcdowell DOB: October 23, 1965 Encounter date: 12/21/2020  This is a 55 y.o. male who presents with Chief Complaint  Patient presents with   Follow-up    History of present illness: Was visit was 09/07/20 to discuss new dx of diabetes and treatment options for this as well as edema. We increased lasix to 27m. States that swelling is about the same. Reverted back to old ways with diet and lack of exercise.  He does not take his fluid pill regularly.  Legs are still very edematous.  He is not doing the wrapping that he was before, but has been trying to wear compression stockings on a regular basis.  We started rybelsus and referred to dietician.  He never picked up the Rybelsus because it was too expensive for him.  We planned to recheck lipids as he was hoping to not start statin if sugar was better controlled.  Cholesterol and blood sugar are actually looking better as with the dietary changes he made initially.  We reviewed blood work today.  He was motivated to work on diet/exercise to help with sugar control.  He started to do this, but quit.  For awhile was doing intermittent fasting, but then just fell off of healthy eating. Portions also an issue.    No Known Allergies Current Meds  Medication Sig   COVID-19 At Home Antigen Test (CARESTART COVID-19 HOME TEST) KIT Use as directed   furosemide (LASIX) 80 MG tablet Take 1 tablet (80 mg total) by mouth daily.   Semaglutide (RYBELSUS) 7 MG TABS Take 7 mg by mouth daily.   triamcinolone ointment (KENALOG) 0.5 % Apply 1 application topically 2 (two) times daily.   [DISCONTINUED] furosemide (LASIX) 40 MG tablet Take 80 mg by mouth.    [DISCONTINUED] Semaglutide (RYBELSUS) 3 MG TABS Take 3 mg by mouth daily.    Review of Systems  Constitutional:  Negative for chills, fatigue and fever.  Respiratory:  Negative for cough, chest tightness, shortness of breath and wheezing.   Cardiovascular:  Positive for leg swelling. Negative for chest  pain and palpitations.   Objective:  BP 120/82 (BP Location: Right Arm, Patient Position: Sitting, Cuff Size: Large)    Pulse 73    Temp 98.1 F (36.7 C) (Oral)    Ht 5' 11.5" (1.816 m)    Wt (!) 306 lb 11.2 oz (139.1 kg)    SpO2 96%    BMI 42.18 kg/m   Weight: (!) 306 lb 11.2 oz (139.1 kg)   BP Readings from Last 3 Encounters:  12/21/20 120/82  09/03/20 130/80  07/13/19 (!) 136/94   Wt Readings from Last 3 Encounters:  12/21/20 (!) 306 lb 11.2 oz (139.1 kg)  09/03/20 (!) 311 lb 12.8 oz (141.4 kg)  07/13/19 289 lb (131.1 kg)    Physical Exam Constitutional:      General: He is not in acute distress.    Appearance: He is well-developed.  HENT:     Head: Normocephalic and atraumatic.  Cardiovascular:     Rate and Rhythm: Normal rate and regular rhythm.     Heart sounds: Normal heart sounds. No murmur heard. Pulmonary:     Effort: Pulmonary effort is normal.     Breath sounds: Normal breath sounds.  Abdominal:     General: Bowel sounds are normal. There is no distension.     Palpations: Abdomen is soft.     Tenderness: There is no abdominal tenderness. There is no guarding.  Musculoskeletal:  Right lower leg: 3+ Edema present.     Left lower leg: 2+ Edema present.  Feet:     Comments: Normal bilateral monofilament foot exam.  Mild heel callus.  Skin is intact. Skin:    General: Skin is warm and dry.     Comments: Sensory exam of the foot is normal, tested with the monofilament. Good pulses, no lesions or ulcers, good peripheral pulses.  Psychiatric:        Judgment: Judgment normal.    Assessment/Plan  1. Class 3 severe obesity due to excess calories with serious comorbidity and body mass index (BMI) of 40.0 to 44.9 in adult [facility]) Would like to get him started on GLP-1.  I showed him how to go to the Rybelsus website today to get out a coupon for medication.  He will try this and let me know if cost is prohibitive still.  We discussed going to a different pharmacy to  price check as he may be able to get a lower cost elsewhere.  His appetite and portions seem to inhibit his ability to lose weight.  I am hoping that the Rybelsus is helpful from the standpoint.  Although he may get more weight loss benefit from a different GLP-1, there is currently a shortage of pretty much every category of GLP-1's, so we will try to stick with Rybelsus for now.  2. Controlled type 2 diabetes mellitus with complication, without long-term current use of insulin ([place]) See above.  Sugars actually better controlled than it was at previous visit.  Continue to work on motivation for more regular activity and healthier eating.  3. CHRONIC Edema of right lower extremity He has bilateral lower extremity edema.  We discussed that weight loss is also helpful from the standpoint.  He did not really try the Lasix 80 mg.  I encouraged him to try this and if not getting some improvement, we can try a lower dose of torsemide.  Continue with compression as this is also helpful.  Limit sodium intake.  He did not complete looking into pumps for legs.  He has a contact for this and can follow-up.  I do think that that would be helpful to help with chronic edema.  He would also benefit from going to lymphedema clinic.  Return in about 3 months (around 03/21/2021) for Chronic condition visit.    Micheline Rough, MD

## 2020-12-21 NOTE — Patient Instructions (Addendum)
Get savings card from rybelsus and bring to pharmacy with printed prescription.   Try the lasix 80mg  and let me know if this is not working for you. Continue with compression regularly.

## 2021-01-11 ENCOUNTER — Telehealth (INDEPENDENT_AMBULATORY_CARE_PROVIDER_SITE_OTHER): Payer: 59 | Admitting: Family Medicine

## 2021-01-11 ENCOUNTER — Other Ambulatory Visit: Payer: Self-pay

## 2021-01-11 DIAGNOSIS — L03115 Cellulitis of right lower limb: Secondary | ICD-10-CM

## 2021-01-11 MED ORDER — CEPHALEXIN 500 MG PO CAPS: 500.0000 mg | ORAL_CAPSULE | Freq: Four times a day (QID) | ORAL | 1 refills | Status: DC

## 2021-01-11 NOTE — Progress Notes (Signed)
Virtual Visit via Video Note  I connected with Benjamin Mcdowell  on 01/11/21 at  3:00 PM EST by a video enabled telemedicine application and verified that I am speaking with the correct person using two identifiers.  Location patient: home Location provider: [place], [place] [address] Persons participating in the virtual visit: patient, provider  I discussed the limitations of evaluation and management by telemedicine and the availability of in person appointments. The patient expressed understanding and agreed to proceed.   Benjamin Mcdowell DOB: May 18, 1965 Encounter date: 01/11/2021  This is a 56 y.o. male who presents with concern for cellulitis  History of present illness: Has dry patch of skin on right foot back from bad cellulitis in 2019. Over weekend visiting with mom on Sunday and foot was itching and he scratched it and then when looked down it was bleeding. Cleaned it off. Next day he started feeling a little off, a little achy/weird. Before cellulitis happens he gets ache in groin/upper thigh, right along lymph nodes. He had some leftover amox that he took and after a few doses started feeling better next day. Achiness started Tuesday and he started antibiotic Tuesday morning.   Has been monitoring foot. Does tend to stay warm. Didn't seem hot/full blown cellulitis.   No Known Allergies No outpatient medications have been marked as taking for the 01/11/21 encounter (Video Visit) with Caren Macadam, MD.    Review of Systems  Constitutional:  Negative for chills, fatigue and fever.  Respiratory:  Negative for cough, chest tightness, shortness of breath and wheezing.   Cardiovascular:  Negative for chest pain, palpitations and leg swelling.  Skin:        Skin appears at baseline per patient.   Objective:  There were no vitals taken for this visit.      BP Readings from Last 3 Encounters:  12/21/20 120/82  09/03/20 130/80  07/13/19  (!) 136/94   Wt Readings from Last 3 Encounters:  12/21/20 (!) 306 lb 11.2 oz (139.1 kg)  09/03/20 (!) 311 lb 12.8 oz (141.4 kg)  07/13/19 289 lb (131.1 kg)    EXAM:  GENERAL: alert, oriented, appears well and in no acute distress  HEENT: atraumatic, conjunctiva clear, no obvious abnormalities on inspection of external nose and ears  NECK: normal movements of the head and neck  LUNGS: on inspection no signs of respiratory distress, breathing rate appears normal, no obvious gross SOB, gasping or wheezing  CV: no obvious cyanosis  MS: moves all visible extremities without noticeable abnormality  PSYCH/NEURO: pleasant and cooperative, no obvious depression or anxiety, speech and thought processing grossly intact   Assessment/Plan 1. Cellulitis of right lower extremity Patient's symptoms resolved with 2 days amoxicillin and have not returned in 48 hours. I think ok to monitor at this point. Keflex rx in case of worsening and suggested to complete at least 5 day course; preferable 7 days if recurrence and to alert provider. Discussed importance of fluid control/edema control as well as this will promote healthier skin.      I discussed the assessment and treatment plan with the patient. The patient was provided an opportunity to ask questions and all were answered. The patient agreed with the plan and demonstrated an understanding of the instructions.   The patient was advised to call back or seek an in-person evaluation if the symptoms worsen or if the condition fails to improve as anticipated.  I provided 20 minutes  of face-to-face time during this encounter.   Micheline Rough, MD

## 2021-01-28 ENCOUNTER — Other Ambulatory Visit (HOSPITAL_BASED_OUTPATIENT_CLINIC_OR_DEPARTMENT_OTHER): Payer: Self-pay

## 2021-01-28 MED ORDER — COVID-19 AT HOME ANTIGEN TEST VI KIT
PACK | 0 refills | Status: AC
  Filled 2021-01-28 – 2021-03-05 (×2): qty 2, 4d supply, fill #0

## 2021-02-07 ENCOUNTER — Other Ambulatory Visit (HOSPITAL_BASED_OUTPATIENT_CLINIC_OR_DEPARTMENT_OTHER): Payer: Self-pay

## 2021-02-16 ENCOUNTER — Other Ambulatory Visit: Payer: Self-pay

## 2021-02-16 ENCOUNTER — Encounter (HOSPITAL_BASED_OUTPATIENT_CLINIC_OR_DEPARTMENT_OTHER): Payer: Self-pay

## 2021-02-16 ENCOUNTER — Emergency Department (HOSPITAL_BASED_OUTPATIENT_CLINIC_OR_DEPARTMENT_OTHER)
Admission: EM | Admit: 2021-02-16 | Discharge: 2021-02-16 | Disposition: A | Discharge status: Home / Self Care | Payer: 59 | Attending: Emergency Medicine | Admitting: Emergency Medicine

## 2021-02-16 DIAGNOSIS — R220 Localized swelling, mass and lump, head: Secondary | ICD-10-CM | POA: Insufficient documentation

## 2021-02-16 DIAGNOSIS — Z5321 Procedure and treatment not carried out due to patient leaving prior to being seen by health care provider: Secondary | ICD-10-CM | POA: Insufficient documentation

## 2021-02-16 NOTE — ED Triage Notes (Signed)
Patient here POV from Home with Left Eye Swelling.  Eye Swelling has been present and progressively draining and watering for a few weeks. Swelling noted to Same.   No Recent Trauma or Injury to Same. Only uses reading glasses. No Fevers. No NV. Blurry Vision at Times   NAD Noted during Triage. A&Ox4. GCS 15. Ambulatory.

## 2021-02-16 NOTE — ED Notes (Signed)
Per Registration, Patient left on own accord. Patient to be discharged accordingly.

## 2021-03-05 ENCOUNTER — Other Ambulatory Visit (HOSPITAL_BASED_OUTPATIENT_CLINIC_OR_DEPARTMENT_OTHER): Payer: Self-pay

## 2021-03-22 ENCOUNTER — Ambulatory Visit: Payer: 59 | Admitting: Family Medicine

## 2021-03-26 ENCOUNTER — Other Ambulatory Visit: Payer: Self-pay | Admitting: Family Medicine

## 2022-01-17 ENCOUNTER — Encounter: Payer: Self-pay | Admitting: Family Medicine

## 2022-01-17 ENCOUNTER — Ambulatory Visit: Payer: 59 | Admitting: Family Medicine

## 2022-01-17 VITALS — BP 102/76 | HR 70 | Temp 96.3°F | Ht 71.5 in | Wt 294.9 lb

## 2022-01-17 DIAGNOSIS — E1165 Type 2 diabetes mellitus with hyperglycemia: Secondary | ICD-10-CM | POA: Diagnosis not present

## 2022-01-17 DIAGNOSIS — I890 Lymphedema, not elsewhere classified: Secondary | ICD-10-CM | POA: Diagnosis not present

## 2022-01-17 DIAGNOSIS — Z125 Encounter for screening for malignant neoplasm of prostate: Secondary | ICD-10-CM | POA: Diagnosis not present

## 2022-01-17 LAB — POCT GLYCOSYLATED HEMOGLOBIN (HGB A1C): Hemoglobin A1C: 11.9 % — AB (ref 4.0–5.6)

## 2022-01-17 MED ORDER — FUROSEMIDE 80 MG PO TABS: 40.0000 mg | ORAL_TABLET | Freq: Two times a day (BID) | ORAL | 3 refills | Status: DC

## 2022-01-17 MED ORDER — METFORMIN HCL 500 MG PO TABS: 500.0000 mg | ORAL_TABLET | Freq: Two times a day (BID) | ORAL | 3 refills | Status: DC

## 2022-01-17 NOTE — Assessment & Plan Note (Addendum)
A1C significantly higher than previous, up to 11.9, we had a long discussion about starting medication and he is agreeable to starting metformin 500 mg BID. I gave him education and handouts on low carb foods. I will see him back in 3 months for a repeat A1C. I have ordered his lipid panel, CMP, and urine microalbumin today

## 2022-01-17 NOTE — Assessment & Plan Note (Signed)
Will restart the furosemide today, It will be more effective taken as 40 mg twice a day instead of 80 mg once a day.

## 2022-01-17 NOTE — Progress Notes (Signed)
Established Patient Office Visit  Subjective   Patient ID: Benjamin Mcdowell, male    DOB: 1965-06-30  Age: 57 y.o. MRN: [id number]  Chief Complaint  Patient presents with   Establish Care    Patient is here for transition of care and follow up.   Chronic right leg lymphedema, pt reports that he has been off the lasix since his last appointment. States that he would like to resume this medication to help with the swelling.   Diabetes-- A1C performed in office today and is uncontrolled, 11.9. Pt reports no blurry vision, no numbness or tingling in his extremities. He states that he has been more thirsty lately. We had a discussion about options for medications. Pt states he couldn't get the rybelsus that Dr. Ethlyn Gallery had prescribed him last year. We discussed the health maintenance for diabetic, specifically getting yearly eye exams and possibly starting a statin medication for CVD risk factor reduction.     Current Outpatient Medications  Medication Instructions   COVID-19 At Home Antigen Test KIT as directed   furosemide (LASIX) 40 mg, Oral, 2 times daily, Take 1/2 tablet  in the morning, then 1/2 tablet around 2 pm   metFORMIN (GLUCOPHAGE) 500 mg, Oral, 2 times daily with meals    Patient Active Problem List   Diagnosis Date Noted   Type 2 diabetes mellitus with hyperglycemia, without long-term current use of insulin ([place]) 01/17/2022   Hx of adenomatous polyp of colon 10/06/2018   Elevated bilirubin 05/02/2016   Hyperglycemia    Obesity, Class II, BMI 35-39.9, isolated 12/21/2014   Cellulitis of right leg 12/21/2014   Chronic acquired lymphedema 01/06/2013   High triglycerides 01/06/2013      Review of Systems  All other systems reviewed and are negative.     Objective:     BP 102/76 (BP Location: Left Arm, Patient Position: Sitting, Cuff Size: Large)   Pulse 70   Temp (!) 96.3 F (35.7 C) (Axillary)   Ht 5' 11.5" (1.816 m)   Wt 294 lb 14.4 oz (133.8 kg)   SpO2 97%    BMI 40.56 kg/m    Physical Exam Vitals reviewed.  Constitutional:      Appearance: Normal appearance. He is well-groomed. He is morbidly obese.  Eyes:     Extraocular Movements: Extraocular movements intact.     Conjunctiva/sclera: Conjunctivae normal.  Neck:     Thyroid: No thyromegaly.  Cardiovascular:     Rate and Rhythm: Normal rate and regular rhythm.     Heart sounds: S1 normal and S2 normal. No murmur heard. Pulmonary:     Effort: Pulmonary effort is normal.     Breath sounds: Normal breath sounds and air entry. No rales.  Abdominal:     General: Abdomen is flat. Bowel sounds are normal.  Musculoskeletal:     Right lower leg: Edema (chronic lymphedema of the RLE below the knee) present.     Left lower leg: No edema.  Neurological:     General: No focal deficit present.     Mental Status: He is alert and oriented to person, place, and time.     Gait: Gait is intact.  Psychiatric:        Mood and Affect: Mood and affect normal.      Results for orders placed or performed in visit on 01/17/22  POC HgB A1c  Result Value Ref Range   Hemoglobin A1C 11.9 (A) 4.0 - 5.6 %   HbA1c POC (<>  result, manual entry)     HbA1c, POC (prediabetic range)     HbA1c, POC (controlled diabetic range)        The 10-year ASCVD risk score (Arnett DK, et al., 2019) is: 9.3%    Assessment & Plan:   Problem List Items Addressed This Visit       Unprioritized   Chronic acquired lymphedema    Will restart the furosemide today, It will be more effective taken as 40 mg twice a day instead of 80 mg once a day.       Relevant Medications   furosemide (LASIX) 80 MG tablet   Type 2 diabetes mellitus with hyperglycemia, without long-term current use of insulin ([facility])    A1C significantly higher than previous, up to 11.9, we had a long discussion about starting medication and he is agreeable to starting metformin 500 mg BID. I gave him education and handouts on low carb foods. I will see  him back in 3 months for a repeat A1C. I have ordered his lipid panel, CMP, and urine microalbumin today       Relevant Medications   metFORMIN (GLUCOPHAGE) 500 MG tablet   Other Relevant Orders   POC HgB A1c (Completed)   CMP   Lipid Panel   Microalbumin/Creatinine Ratio, Urine   Other Visit Diagnoses     Prostate cancer screening    -  Primary   Relevant Orders   PSA       Return in about 3 months (around 04/18/2022) for Follow up DM.    Farrel Conners, MD

## 2022-01-21 ENCOUNTER — Other Ambulatory Visit (INDEPENDENT_AMBULATORY_CARE_PROVIDER_SITE_OTHER): Payer: 59

## 2022-01-21 DIAGNOSIS — E1165 Type 2 diabetes mellitus with hyperglycemia: Secondary | ICD-10-CM | POA: Diagnosis not present

## 2022-01-21 DIAGNOSIS — Z125 Encounter for screening for malignant neoplasm of prostate: Secondary | ICD-10-CM | POA: Diagnosis not present

## 2022-01-21 LAB — LIPID PANEL
Cholesterol: 186 mg/dL (ref 0–200)
HDL: 35.6 mg/dL — ABNORMAL LOW (ref >39.00)
NonHDL: 150.11
Total CHOL/HDL Ratio: 5
Triglycerides: 207 mg/dL — ABNORMAL HIGH (ref 0.0–149.0)
VLDL: 41.4 mg/dL — ABNORMAL HIGH (ref 0.0–40.0)

## 2022-01-21 LAB — COMPREHENSIVE METABOLIC PANEL
ALT: 83 U/L — ABNORMAL HIGH (ref 0–53)
AST: 34 U/L (ref 0–37)
Albumin: 4.5 g/dL (ref 3.5–5.2)
Alkaline Phosphatase: 73 U/L (ref 39–117)
BUN: 14 mg/dL (ref 6–23)
CO2: 31 mEq/L (ref 19–32)
Calcium: 9.4 mg/dL (ref 8.4–10.5)
Chloride: 98 mEq/L (ref 96–112)
Creatinine, Ser: 1.01 mg/dL (ref 0.40–1.50)
GFR: 83.18 mL/min (ref >60.00)
Glucose, Bld: 362 mg/dL — ABNORMAL HIGH (ref 70–99)
Potassium: 4.7 mEq/L (ref 3.5–5.1)
Sodium: 137 mEq/L (ref 135–145)
Total Bilirubin: 1.4 mg/dL — ABNORMAL HIGH (ref 0.2–1.2)
Total Protein: 6.6 g/dL (ref 6.0–8.3)

## 2022-01-21 LAB — MICROALBUMIN / CREATININE URINE RATIO
Creatinine,U: 83.6 mg/dL
Microalb Creat Ratio: 0.8 mg/g (ref 0.0–30.0)
Microalb, Ur: <0.7 mg/dL (ref 0.0–1.9)

## 2022-01-21 LAB — PSA: PSA: 0.78 ng/mL (ref 0.10–4.00)

## 2022-01-21 LAB — LDL CHOLESTEROL, DIRECT: Direct LDL: 106 mg/dL

## 2022-04-21 ENCOUNTER — Ambulatory Visit: Payer: 59 | Admitting: Family Medicine

## 2022-08-21 ENCOUNTER — Encounter (INDEPENDENT_AMBULATORY_CARE_PROVIDER_SITE_OTHER): Payer: Self-pay

## 2022-09-30 ENCOUNTER — Ambulatory Visit: Payer: 59 | Admitting: Family Medicine

## 2022-09-30 ENCOUNTER — Encounter: Payer: Self-pay | Admitting: Family Medicine

## 2022-09-30 VITALS — BP 120/88 | HR 67 | Temp 98.5°F | Ht 71.5 in | Wt 307.7 lb

## 2022-09-30 DIAGNOSIS — Z7985 Long-term (current) use of injectable non-insulin antidiabetic drugs: Secondary | ICD-10-CM | POA: Diagnosis not present

## 2022-09-30 DIAGNOSIS — E1165 Type 2 diabetes mellitus with hyperglycemia: Secondary | ICD-10-CM | POA: Diagnosis not present

## 2022-09-30 LAB — POCT GLYCOSYLATED HEMOGLOBIN (HGB A1C): Hemoglobin A1C: 7 % — AB (ref 4.0–5.6)

## 2022-09-30 MED ORDER — SEMAGLUTIDE(0.25 OR 0.5MG/DOS) 2 MG/3ML ~~LOC~~ SOPN: 0.2500 mg | PEN_INJECTOR | SUBCUTANEOUS | 2 refills | Status: DC

## 2022-10-02 ENCOUNTER — Telehealth: Payer: Self-pay

## 2022-10-02 ENCOUNTER — Other Ambulatory Visit (HOSPITAL_COMMUNITY): Payer: Self-pay

## 2022-10-02 NOTE — Telephone Encounter (Signed)
Pharmacy Patient Advocate Encounter   Received notification from Physician's Office that prior authorization for [facility] is required/requested.   Insurance verification completed.   The patient is insured through [employer] .   Per test claim: PA required; PA submitted to [employer] via CoverMyMeds Key/confirmation #/EOC Key: [id number]   Status is pending

## 2022-10-03 ENCOUNTER — Other Ambulatory Visit (HOSPITAL_COMMUNITY): Payer: Self-pay

## 2022-10-03 NOTE — Telephone Encounter (Signed)
Spoke with Adelina Mings at [facility] and informed her of the approval as below.

## 2022-10-03 NOTE — Telephone Encounter (Signed)
Pharmacy Patient Advocate Encounter  Received notification from [employer] that Prior Authorization for [facility] has been APPROVED from 8.27.24 to 9.26.25. Ran test claim, and the Rx was last filled 09/30/22 and is not yet due/ payable.    This test claim was processed through [facility]- copay amounts may vary at other pharmacies due to pharmacy/plan contracts, or as the patient moves through the different stages of their insurance plan.   PA #/Case ID/Reference #: (Key: [id number])

## 2022-10-06 NOTE — Assessment & Plan Note (Signed)
Better A1C today. Patient also has morbid obesity and he would be an excellent candidate for semaglutide injections. I have called these in for him. Will see him back in 4 months for his annual physical. Foot exam performed today and he was reminded to get his eye exam this year also.

## 2022-10-07 ENCOUNTER — Other Ambulatory Visit: Payer: 59

## 2023-01-29 ENCOUNTER — Ambulatory Visit (INDEPENDENT_AMBULATORY_CARE_PROVIDER_SITE_OTHER): Payer: 59 | Admitting: Family Medicine

## 2023-01-29 ENCOUNTER — Encounter: Payer: Self-pay | Admitting: Family Medicine

## 2023-01-29 VITALS — BP 112/80 | HR 75 | Temp 98.5°F | Ht 72.25 in | Wt 310.0 lb

## 2023-01-29 DIAGNOSIS — Z0000 Encounter for general adult medical examination without abnormal findings: Secondary | ICD-10-CM

## 2023-01-29 DIAGNOSIS — E1165 Type 2 diabetes mellitus with hyperglycemia: Secondary | ICD-10-CM

## 2023-01-29 DIAGNOSIS — G4733 Obstructive sleep apnea (adult) (pediatric): Secondary | ICD-10-CM | POA: Insufficient documentation

## 2023-01-29 DIAGNOSIS — Z7985 Long-term (current) use of injectable non-insulin antidiabetic drugs: Secondary | ICD-10-CM

## 2023-01-29 DIAGNOSIS — I890 Lymphedema, not elsewhere classified: Secondary | ICD-10-CM

## 2023-01-29 LAB — LIPID PANEL
Cholesterol: 188 mg/dL (ref 0–200)
HDL: 39.6 mg/dL (ref >39.00)
LDL Cholesterol: 110 mg/dL — ABNORMAL HIGH (ref 0–99)
NonHDL: 147.93
Total CHOL/HDL Ratio: 5
Triglycerides: 191 mg/dL — ABNORMAL HIGH (ref 0.0–149.0)
VLDL: 38.2 mg/dL (ref 0.0–40.0)

## 2023-01-29 LAB — POCT GLYCOSYLATED HEMOGLOBIN (HGB A1C): Hemoglobin A1C: 7.7 % — AB (ref 4.0–5.6)

## 2023-01-29 LAB — MICROALBUMIN / CREATININE URINE RATIO
Creatinine,U: 89.8 mg/dL
Microalb Creat Ratio: 0.8 mg/g (ref 0.0–30.0)
Microalb, Ur: <0.7 mg/dL (ref 0.0–1.9)

## 2023-01-29 LAB — COMPREHENSIVE METABOLIC PANEL
ALT: 77 U/L — ABNORMAL HIGH (ref 0–53)
AST: 29 U/L (ref 0–37)
Albumin: 4.4 g/dL (ref 3.5–5.2)
Alkaline Phosphatase: 65 U/L (ref 39–117)
BUN: 12 mg/dL (ref 6–23)
CO2: 30 meq/L (ref 19–32)
Calcium: 9.4 mg/dL (ref 8.4–10.5)
Chloride: 103 meq/L (ref 96–112)
Creatinine, Ser: 0.88 mg/dL (ref 0.40–1.50)
GFR: 95.49 mL/min (ref >60.00)
Glucose, Bld: 189 mg/dL — ABNORMAL HIGH (ref 70–99)
Potassium: 4.9 meq/L (ref 3.5–5.1)
Sodium: 143 meq/L (ref 135–145)
Total Bilirubin: 0.8 mg/dL (ref 0.2–1.2)
Total Protein: 6.8 g/dL (ref 6.0–8.3)

## 2023-01-29 MED ORDER — FUROSEMIDE 80 MG PO TABS
40.0000 mg | ORAL_TABLET | Freq: Two times a day (BID) | ORAL | 1 refills | Status: DC
Start: 2023-01-29 — End: 2023-05-17

## 2023-01-29 MED ORDER — SEMAGLUTIDE(0.25 OR 0.5MG/DOS) 2 MG/3ML ~~LOC~~ SOPN: PEN_INJECTOR | SUBCUTANEOUS | 5 refills | Status: AC

## 2023-01-29 NOTE — Patient Instructions (Addendum)
Ask pharmacist if a 90 day supply is cheaper than 30 day supply of Ozempic.   Berberbine -- 500-600 mg 30 minutes before each meal Red yeast rice for cholesterol  Health Maintenance, Male Adopting a healthy lifestyle and getting preventive care are important in promoting health and wellness. Ask your health care provider about: The right schedule for you to have regular tests and exams. Things you can do on your own to prevent diseases and keep yourself healthy. What should I know about diet, weight, and exercise? Eat a healthy diet  Eat a diet that includes plenty of vegetables, fruits, low-fat dairy products, and lean protein. Do not eat a lot of foods that are high in solid fats, added sugars, or sodium. Maintain a healthy weight Body mass index (BMI) is a measurement that can be used to identify possible weight problems. It estimates body fat based on height and weight. Your health care provider can help determine your BMI and help you achieve or maintain a healthy weight. Get regular exercise Get regular exercise. This is one of the most important things you can do for your health. Most adults should: Exercise for at least 150 minutes each week. The exercise should increase your heart rate and make you sweat (moderate-intensity exercise). Do strengthening exercises at least twice a week. This is in addition to the moderate-intensity exercise. Spend less time sitting. Even light physical activity can be beneficial. Watch cholesterol and blood lipids Have your blood tested for lipids and cholesterol at 58 years of age, then have this test every 5 years. You may need to have your cholesterol levels checked more often if: Your lipid or cholesterol levels are high. You are older than 58 years of age. You are at high risk for heart disease. What should I know about cancer screening? Many types of cancers can be detected early and may often be prevented. Depending on your health history and  family history, you may need to have cancer screening at various ages. This may include screening for: Colorectal cancer. Prostate cancer. Skin cancer. Lung cancer. What should I know about heart disease, diabetes, and high blood pressure? Blood pressure and heart disease High blood pressure causes heart disease and increases the risk of stroke. This is more likely to develop in people who have high blood pressure readings or are overweight. Talk with your health care provider about your target blood pressure readings. Have your blood pressure checked: Every 3-5 years if you are 66-51 years of age. Every year if you are 44 years old or older. If you are between the ages of 35 and 18 and are a current or former smoker, ask your health care provider if you should have a one-time screening for abdominal aortic aneurysm (AAA). Diabetes Have regular diabetes screenings. This checks your fasting blood sugar level. Have the screening done: Once every three years after age 21 if you are at a normal weight and have a low risk for diabetes. More often and at a younger age if you are overweight or have a high risk for diabetes. What should I know about preventing infection? Hepatitis B If you have a higher risk for hepatitis B, you should be screened for this virus. Talk with your health care provider to find out if you are at risk for hepatitis B infection. Hepatitis C Blood testing is recommended for: Everyone born from [birth year] through 1965. Anyone with known risk factors for hepatitis C. Sexually transmitted infections (STIs) You should be  screened each year for STIs, including gonorrhea and chlamydia, if: You are sexually active and are younger than 58 years of age. You are older than 58 years of age and your health care provider tells you that you are at risk for this type of infection. Your sexual activity has changed since you were last screened, and you are at increased risk for chlamydia or  gonorrhea. Ask your health care provider if you are at risk. Ask your health care provider about whether you are at high risk for HIV. Your health care provider may recommend a prescription medicine to help prevent HIV infection. If you choose to take medicine to prevent HIV, you should first get tested for HIV. You should then be tested every 3 months for as long as you are taking the medicine. Follow these instructions at home: Alcohol use Do not drink alcohol if your health care provider tells you not to drink. If you drink alcohol: Limit how much you have to 0-2 drinks a day. Know how much alcohol is in your drink. In the [place] one drink equals one 12 oz bottle of beer (355 mL), one 5 oz glass of wine (148 mL), or one 1 oz glass of hard liquor (44 mL). Lifestyle Do not use any products that contain nicotine or tobacco. These products include cigarettes, chewing tobacco, and vaping devices, such as e-cigarettes. If you need help quitting, ask your health care provider. Do not use street drugs. Do not share needles. Ask your health care provider for help if you need support or information about quitting drugs. General instructions Schedule regular health, dental, and eye exams. Stay current with your vaccines. Tell your health care provider if: You often feel depressed. You have ever been abused or do not feel safe at home. Summary Adopting a healthy lifestyle and getting preventive care are important in promoting health and wellness. Follow your health care provider's instructions about healthy diet, exercising, and getting tested or screened for diseases. Follow your health care provider's instructions on monitoring your cholesterol and blood pressure. This information is not intended to replace advice given to you by your health care provider. Make sure you discuss any questions you have with your health care provider. Document Revised: 05/14/2020 Document Reviewed: 05/14/2020 Elsevier  Patient Education  2024 [employer].

## 2023-01-29 NOTE — Progress Notes (Signed)
Complete physical exam  Patient: Benjamin Mcdowell   DOB: 30-May-1965   58 y.o. Male  MRN: [id number]  Subjective:    Chief Complaint  Patient presents with   Annual Exam    Benjamin Mcdowell is a 58 y.o. male who presents today for a complete physical exam. He reports consuming a general diet. The patient does not participate in regular exercise at present. He generally feels well. He reports sleeping fairly well. He does not have additional problems to discuss today.    Most recent fall risk assessment:    01/29/2015    4:07 PM  Fall Risk   Falls in the past year? No     Most recent depression screenings:    01/29/2023    8:23 AM 01/17/2022    8:41 AM  PHQ 2/9 Scores  PHQ - 2 Score 0 0  PHQ- 9 Score  4    Vision:Not within last year  and Dental: No current dental problems and Last dental visit: over 1 year ago  Patient Active Problem List   Diagnosis Date Noted   OSA (obstructive sleep apnea) 01/29/2023   Type 2 diabetes mellitus with hyperglycemia, without long-term current use of insulin (HCC) 01/17/2022   Hx of adenomatous polyp of colon 10/06/2018   Elevated bilirubin 05/02/2016   Hyperglycemia    Obesity, Class II, BMI 35-39.9, isolated 12/21/2014   Cellulitis of right leg 12/21/2014   Chronic acquired lymphedema 01/06/2013   High triglycerides 01/06/2013      Patient Care Team: Benjamin Georges, MD as PCP - [facility] ([facility]) [facility], [facility] (Inactive) (Dermatology)   Outpatient Medications Prior to Visit  Medication Sig   COVID-19 At Home Antigen Test KIT as directed   metFORMIN (GLUCOPHAGE) 500 MG tablet Take 1 tablet (500 mg total) by mouth 2 (two) times daily with a meal.   [DISCONTINUED] furosemide (LASIX) 80 MG tablet Take 0.5 tablets (40 mg total) by mouth 2 (two) times daily. Take 1/2 tablet  in the morning, then 1/2 tablet around 2 pm   [DISCONTINUED] Semaglutide,0.25 or 0.5MG /DOS, 2 MG/3ML SOPN Inject 0.25 mg into the skin once a  week.   No facility-administered medications prior to visit.    Review of Systems  HENT:  Negative for hearing loss.   Eyes:  Negative for blurred vision.  Respiratory:  Negative for shortness of breath.   Cardiovascular:  Negative for chest pain.  Gastrointestinal: Negative.   Genitourinary: Negative.   Musculoskeletal:  Negative for back pain.  Neurological:  Negative for headaches.  Psychiatric/Behavioral:  Negative for depression.        Objective:     BP 112/80   Pulse 75   Temp 98.5 F (36.9 C) (Oral)   Ht 6' 0.25" (1.835 m)   Wt (!) 310 lb (140.6 kg)   SpO2 97%   BMI 41.75 kg/m    Physical Exam Vitals reviewed.  Constitutional:      Appearance: Normal appearance. He is well-groomed. He is morbidly obese.  HENT:     Right Ear: Tympanic membrane and ear canal normal.     Left Ear: Tympanic membrane and ear canal normal.     Mouth/Throat:     Mouth: Mucous membranes are moist.     Pharynx: No posterior oropharyngeal erythema.  Eyes:     Extraocular Movements: Extraocular movements intact.     Conjunctiva/sclera: Conjunctivae normal.  Neck:     Thyroid: No thyromegaly.  Cardiovascular:  Rate and Rhythm: Normal rate and regular rhythm.     Heart sounds: S1 normal and S2 normal. No murmur heard. Pulmonary:     Effort: Pulmonary effort is normal.     Breath sounds: Normal breath sounds and air entry. No rales.  Abdominal:     General: Abdomen is flat. Bowel sounds are normal.  Musculoskeletal:     Right lower leg: No edema.     Left lower leg: No edema.  Lymphadenopathy:     Cervical: No cervical adenopathy.  Neurological:     General: No focal deficit present.     Mental Status: He is alert and oriented to person, place, and time.     Gait: Gait is intact.  Psychiatric:        Mood and Affect: Mood and affect normal.      Results for orders placed or performed in visit on 01/29/23  POC HgB A1c  Result Value Ref Range   Hemoglobin A1C 7.7 (A)  4.0 - 5.6 %   HbA1c POC (<> result, manual entry)     HbA1c, POC (prediabetic range)     HbA1c, POC (controlled diabetic range)         Assessment & Plan:    Routine Health Maintenance and Physical Exam  Immunization History  Administered Date(s) Administered   Influenza,inj,Quad PF,6+ Mos 09/04/2014, 12/27/2015, 11/17/2016   Influenza,inj,Quad PF,6-35 Mos 12/07/2018   PFIZER(Purple Top)SARS-COV-2 Vaccination 07/06/2019, 08/06/2019, 01/02/2020   Tdap 03/16/2015    Health Maintenance  Topic Date Due   Pneumococcal Vaccine 4-36 Years old (1 of 2 - PCV) Never done   OPHTHALMOLOGY EXAM  Never done   Zoster Vaccines- Shingrix (1 of 2) Never done   COVID-19 Vaccine (4 - 2024-25 season) 09/07/2022   Diabetic kidney evaluation - eGFR measurement  01/22/2023   Diabetic kidney evaluation - Urine ACR  01/22/2023   INFLUENZA VACCINE  04/06/2023 (Originally 08/07/2022)   HEMOGLOBIN A1C  07/29/2023   FOOT EXAM  09/30/2023   DTaP/Tdap/Td (2 - Td or Tdap) 03/15/2025   Colonoscopy  09/26/2025   Hepatitis C Screening  Completed   HIV Screening  Completed   HPV VACCINES  Aged Out    Discussed health benefits of physical activity, and encouraged him to engage in regular exercise appropriate for his age and condition.  Routine general medical examination at a health care facility     -- Normal physical exam findings, counseled patient on increasing exercise to 30 minutes 5 days a week, handouts given on healthy eating and exercise. Labs ordered for annual surveillance.    Type 2 diabetes mellitus with hyperglycemia, without long-term current use of insulin (HCC) -     POCT glycosylated hemoglobin (Hb A1C) -     Lipid panel; Future -     Comprehensive metabolic panel -     Semaglutide(0.25 or 0.5MG /DOS); Inject 0.25 mg into the skin once a week for 28 days, THEN 0.5 mg once a week for 28 days.  Dispense: 3 mL; Refill: 5 -     Microalbumin / creatinine urine ratio  Chronic acquired  lymphedema -     Furosemide; Take 0.5 tablets (40 mg total) by mouth 2 (two) times daily. Take 1/2 tablet  in the morning, then 1/2 tablet around 2 pm  Dispense: 90 tablet; Refill: 1  OSA (obstructive sleep apnea)    Return in 6 months (on 07/29/2023) for DM.     Benjamin Georges, MD

## 2023-02-02 ENCOUNTER — Encounter: Payer: Self-pay | Admitting: Family Medicine

## 2023-04-02 ENCOUNTER — Other Ambulatory Visit: Payer: Self-pay | Admitting: Family Medicine

## 2023-04-02 DIAGNOSIS — E1165 Type 2 diabetes mellitus with hyperglycemia: Secondary | ICD-10-CM

## 2023-04-02 NOTE — Telephone Encounter (Signed)
 Yes

## 2023-04-02 NOTE — Telephone Encounter (Signed)
 Rx done.

## 2023-05-16 ENCOUNTER — Other Ambulatory Visit: Payer: Self-pay | Admitting: Family Medicine

## 2023-05-16 DIAGNOSIS — I890 Lymphedema, not elsewhere classified: Secondary | ICD-10-CM

## 2023-07-29 ENCOUNTER — Ambulatory Visit: Payer: 59 | Admitting: Family Medicine

## 2023-07-29 ENCOUNTER — Encounter: Payer: Self-pay | Admitting: Family Medicine

## 2023-07-29 VITALS — BP 100/70 | HR 63 | Temp 98.3°F | Ht 72.25 in | Wt 277.2 lb

## 2023-07-29 DIAGNOSIS — Z7984 Long term (current) use of oral hypoglycemic drugs: Secondary | ICD-10-CM

## 2023-07-29 DIAGNOSIS — E1165 Type 2 diabetes mellitus with hyperglycemia: Secondary | ICD-10-CM

## 2023-07-29 DIAGNOSIS — I890 Lymphedema, not elsewhere classified: Secondary | ICD-10-CM | POA: Diagnosis not present

## 2023-07-29 LAB — POCT GLYCOSYLATED HEMOGLOBIN (HGB A1C): Hemoglobin A1C: 13.8 % — AB (ref 4.0–5.6)

## 2023-07-29 LAB — MICROALBUMIN / CREATININE URINE RATIO
Creatinine,U: 43.7 mg/dL
Microalb Creat Ratio: UNDETERMINED mg/g (ref 0.0–30.0)
Microalb, Ur: <0.7 mg/dL

## 2023-07-29 LAB — COMPREHENSIVE METABOLIC PANEL WITH GFR
ALT: 43 U/L (ref 0–53)
AST: 19 U/L (ref 0–37)
Albumin: 4.4 g/dL (ref 3.5–5.2)
Alkaline Phosphatase: 74 U/L (ref 39–117)
BUN: 14 mg/dL (ref 6–23)
CO2: 27 meq/L (ref 19–32)
Calcium: 9.3 mg/dL (ref 8.4–10.5)
Chloride: 99 meq/L (ref 96–112)
Creatinine, Ser: 0.96 mg/dL (ref 0.40–1.50)
GFR: 87.47 mL/min (ref >60.00)
Glucose, Bld: 418 mg/dL — ABNORMAL HIGH (ref 70–99)
Potassium: 4.1 meq/L (ref 3.5–5.1)
Sodium: 136 meq/L (ref 135–145)
Total Bilirubin: 1.2 mg/dL (ref 0.2–1.2)
Total Protein: 6.9 g/dL (ref 6.0–8.3)

## 2023-07-29 MED ORDER — TIRZEPATIDE 2.5 MG/0.5ML ~~LOC~~ SOAJ: 2.5000 mg | SUBCUTANEOUS | 0 refills | Status: DC

## 2023-07-29 MED ORDER — FUROSEMIDE 80 MG PO TABS: 40.0000 mg | ORAL_TABLET | Freq: Two times a day (BID) | ORAL | 1 refills | Status: DC

## 2023-07-29 MED ORDER — METFORMIN HCL 1000 MG PO TABS: 1000.0000 mg | ORAL_TABLET | Freq: Two times a day (BID) | ORAL | 1 refills | Status: DC

## 2023-07-29 MED ORDER — TIRZEPATIDE 2.5 MG/0.5ML ~~LOC~~ SOAJ
2.5000 mg | SUBCUTANEOUS | 0 refills | Status: DC
Start: 2023-07-29 — End: 2023-07-29

## 2023-07-29 MED ORDER — ATORVASTATIN CALCIUM 20 MG PO TABS: 20.0000 mg | ORAL_TABLET | Freq: Every day | ORAL | 1 refills | Status: DC

## 2023-07-29 NOTE — Assessment & Plan Note (Signed)
 Swelling today appears well controlled, refilled patient's furosemide .

## 2023-07-29 NOTE — Assessment & Plan Note (Signed)
 A1C back up to 13.8, higher than previous, pt is nonadherent to the metformin  and his diet, will increase metformin  to 1000 mg BID and add mounjaro  2.5 mg weekly injections, will continue to see him every 3 months until his A1C has improved. I also counseled the patient on starting statin medication to reduce his cardiac risk (at 10%) risks/benefits of atorvastatin  discussed with patient. Scripts sent. I spent a total of 30 minutes today with the patient in education and counseling on the various medications and the current guidelines for diabetics. Etc.

## 2023-07-29 NOTE — Progress Notes (Signed)
 Established Patient Office Visit  Subjective   Patient ID: Benjamin Mcdowell, male    DOB: March 04, 1965  Age: 58 y.o. MRN: [id number]  Chief Complaint  Patient presents with   Medical Management of Chronic Issues    Pt states he has lost some weight since his last visit. He states that he has been trying to eat cleaner, states he has been losing weight, has lost about 30 pounds in the last 6 months. A1C today is back up to 13.8, states that he will sometimes forget to take his evening dose of metformin . States that he does sometimes eat some sweets, states maybe he wasn't being as disciplined as he thought. I counseled the patient on medications including GLP medications that help him reduce hunger and stick to his dietary goals long term. I recommended starting mounjaro , pt is agreeable.     Current Outpatient Medications  Medication Instructions   atorvastatin  (LIPITOR) 20 mg, Oral, Daily   COVID-19 At Home Antigen Test KIT as directed   furosemide  (LASIX ) 40 mg, Oral, 2 times daily, Take 1/2 tablet  in the morning, then 1/2 tablet around 2 pm   metFORMIN  (GLUCOPHAGE ) 1,000 mg, Oral, 2 times daily with meals   tirzepatide  (MOUNJARO ) 2.5 mg, Subcutaneous, Weekly    Patient Active Problem List   Diagnosis Date Noted   OSA (obstructive sleep apnea) 01/29/2023   Type 2 diabetes mellitus with hyperglycemia, without long-term current use of insulin (HCC) 01/17/2022   Hx of adenomatous polyp of colon 10/06/2018   Elevated bilirubin 05/02/2016   Hyperglycemia    Obesity, Class II, BMI 35-39.9, isolated 12/21/2014   Cellulitis of right leg 12/21/2014   Chronic acquired lymphedema 01/06/2013   High triglycerides 01/06/2013      Review of Systems  All other systems reviewed and are negative.     Objective:     BP 100/70   Pulse 63   Temp 98.3 F (36.8 C) (Oral)   Ht 6' 0.25 (1.835 m)   Wt 277 lb 3.2 oz (125.7 kg)   SpO2 97%   BMI 37.34 kg/m    Physical Exam Vitals  reviewed.  Constitutional:      Appearance: Normal appearance. He is well-groomed. He is obese.  Neck:     Thyroid: No thyromegaly.  Cardiovascular:     Rate and Rhythm: Normal rate and regular rhythm.     Heart sounds: S1 normal and S2 normal. No murmur heard. Pulmonary:     Effort: Pulmonary effort is normal.     Breath sounds: Normal breath sounds and air entry. No rales.  Musculoskeletal:     Right lower leg: Edema (1+ ankles BL) present.     Left lower leg: Edema present.  Neurological:     General: No focal deficit present.     Mental Status: He is alert and oriented to person, place, and time.     Gait: Gait is intact.  Psychiatric:        Mood and Affect: Mood and affect normal.          The 10-year ASCVD risk score (Arnett DK, et al., 2019) is: 10.8%    Assessment & Plan:  Type 2 diabetes mellitus with hyperglycemia, without long-term current use of insulin (HCC) Assessment & Plan: A1C back up to 13.8, higher than previous, pt is nonadherent to the metformin  and his diet, will increase metformin  to 1000 mg BID and add mounjaro  2.5 mg weekly injections, will continue to see him  every 3 months until his A1C has improved. I also counseled the patient on starting statin medication to reduce his cardiac risk (at 10%) risks/benefits of atorvastatin  discussed with patient. Scripts sent. I spent a total of 30 minutes today with the patient in education and counseling on the various medications and the current guidelines for diabetics. Etc.   Orders: -     POCT glycosylated hemoglobin (Hb A1C) -     Collection capillary blood specimen -     C-peptide; Future -     metFORMIN  HCl; Take 1 tablet (1,000 mg total) by mouth 2 (two) times daily with a meal.  Dispense: 180 tablet; Refill: 1 -     Tirzepatide ; Inject 2.5 mg into the skin once a week.  Dispense: 6 mL; Refill: 0 -     Microalbumin / creatinine urine ratio; Future -     Comprehensive metabolic panel with GFR; Future -      Atorvastatin  Calcium ; Take 1 tablet (20 mg total) by mouth daily.  Dispense: 90 tablet; Refill: 1  Chronic acquired lymphedema Assessment & Plan: Swelling today appears well controlled, refilled patient's furosemide .   Orders: -     Furosemide ; Take 0.5 tablets (40 mg total) by mouth 2 (two) times daily. Take 1/2 tablet  in the morning, then 1/2 tablet around 2 pm  Dispense: 90 tablet; Refill: 1     Return in about 3 months (around 10/29/2023) for DM.    Heron CHRISTELLA Sharper, MD

## 2023-07-30 LAB — C-PEPTIDE: C-Peptide: 2.98 ng/mL (ref 0.80–3.85)

## 2023-08-03 ENCOUNTER — Ambulatory Visit: Payer: Self-pay | Admitting: Family Medicine

## 2023-09-04 ENCOUNTER — Telehealth: Payer: Self-pay | Admitting: Pharmacy Technician

## 2023-09-04 NOTE — Telephone Encounter (Signed)
 Pharmacy Patient Advocate Encounter   Received notification from CoverMyMeds that prior authorization for Ozempic  (0.25 or 0.5 MG/DOSE) 2MG /3ML pen-injectors is due for renewal.   Insurance verification completed.   The patient is insured through [employer].  Action: Medication has been discontinued. Archived Key: [id number]

## 2023-10-29 ENCOUNTER — Ambulatory Visit: Admitting: Family Medicine

## 2023-10-29 ENCOUNTER — Encounter: Payer: Self-pay | Admitting: Family Medicine

## 2023-10-29 VITALS — BP 130/86 | HR 74 | Temp 98.3°F | Wt 286.8 lb

## 2023-10-29 DIAGNOSIS — Z7985 Long-term (current) use of injectable non-insulin antidiabetic drugs: Secondary | ICD-10-CM

## 2023-10-29 DIAGNOSIS — E1165 Type 2 diabetes mellitus with hyperglycemia: Secondary | ICD-10-CM

## 2023-10-29 LAB — POCT GLYCOSYLATED HEMOGLOBIN (HGB A1C): Hemoglobin A1C: 6.7 % — AB (ref 4.0–5.6)

## 2023-10-29 LAB — LIPID PANEL
Cholesterol: 111 mg/dL (ref 0–200)
HDL: 36.2 mg/dL — ABNORMAL LOW (ref >39.00)
LDL Cholesterol: 54 mg/dL (ref 0–99)
NonHDL: 74.38
Total CHOL/HDL Ratio: 3
Triglycerides: 103 mg/dL (ref 0.0–149.0)
VLDL: 20.6 mg/dL (ref 0.0–40.0)

## 2023-10-29 LAB — HEPATIC FUNCTION PANEL
ALT: 31 U/L (ref 0–53)
AST: 19 U/L (ref 0–37)
Albumin: 4.5 g/dL (ref 3.5–5.2)
Alkaline Phosphatase: 54 U/L (ref 39–117)
Bilirubin, Direct: 0.2 mg/dL (ref 0.0–0.3)
Total Bilirubin: 0.9 mg/dL (ref 0.2–1.2)
Total Protein: 6.8 g/dL (ref 6.0–8.3)

## 2023-10-29 MED ORDER — TIRZEPATIDE 5 MG/0.5ML ~~LOC~~ SOAJ: 5.0000 mg | SUBCUTANEOUS | 1 refills | Status: DC

## 2023-10-29 NOTE — Patient Instructions (Signed)
 Diabetic eye exam is due -- please have them send me the results after this is complete.

## 2023-10-29 NOTE — Progress Notes (Signed)
 Established Patient Office Visit  Subjective   Patient ID: Benjamin Mcdowell, male    DOB: October 28, 1965  Age: 58 y.o. MRN: [id number]  Chief Complaint  Patient presents with   Medical Management of Chronic Issues    HPI  Discussed the use of AI scribe software for clinical note transcription with the patient, who gave verbal consent to proceed.  History of Present Illness   Benjamin Mcdowell is a 58 year old male with type 2 diabetes who presents for follow-up on diabetes management.  His A1c has improved significantly, decreasing by seven points over the past three months. He is currently on Mounjaro  2.5 mg and metformin , which has been doubled in dosage. His eating habits have changed slightly, but there is no significant change in weight yet.  He has lymphedema, which he associates with his weight. He experiences some swelling, particularly in one leg, with a faint but noticeable sensation when pressure is applied.  He is also on statins for cholesterol management and has recently started this medication. He reports he is tolerating the medication well, no side effects, no muscle cramps      Current Outpatient Medications  Medication Instructions   atorvastatin  (LIPITOR) 20 mg, Oral, Daily   COVID-19 At Home Antigen Test KIT as directed   furosemide  (LASIX ) 40 mg, Oral, 2 times daily, Take 1/2 tablet  in the morning, then 1/2 tablet around 2 pm   metFORMIN  (GLUCOPHAGE ) 1,000 mg, Oral, 2 times daily with meals   tirzepatide  (MOUNJARO ) 5 mg, Subcutaneous, Weekly    Patient Active Problem List   Diagnosis Date Noted   OSA (obstructive sleep apnea) 01/29/2023   Type 2 diabetes mellitus with hyperglycemia, without long-term current use of insulin (HCC) 01/17/2022   Hx of adenomatous polyp of colon 10/06/2018   Elevated bilirubin 05/02/2016   Hyperglycemia    Obesity, Class II, BMI 35-39.9, isolated 12/21/2014   Cellulitis of right leg 12/21/2014   Chronic acquired lymphedema  01/06/2013   High triglycerides 01/06/2013     Review of Systems  All other systems reviewed and are negative.     Objective:     BP 130/86   Pulse 74   Temp 98.3 F (36.8 C) (Oral)   Wt 286 lb 12.8 oz (130.1 kg)   SpO2 96%   BMI 38.63 kg/m    Physical Exam Vitals reviewed.  Constitutional:      Appearance: Normal appearance. He is well-groomed. He is obese.  Neck:     Thyroid: No thyromegaly.  Cardiovascular:     Rate and Rhythm: Normal rate and regular rhythm.     Heart sounds: S1 normal and S2 normal. No murmur heard. Pulmonary:     Effort: Pulmonary effort is normal.     Breath sounds: Normal breath sounds and air entry. No rales.  Musculoskeletal:     Right lower leg: Edema (1+ ankles BL) present.     Left lower leg: Edema present.  Neurological:     General: No focal deficit present.     Mental Status: He is alert and oriented to person, place, and time.     Gait: Gait is intact.  Psychiatric:        Mood and Affect: Mood and affect normal.      Results for orders placed or performed in visit on 10/29/23  POC HgB A1c  Result Value Ref Range   Hemoglobin A1C 6.7 (A) 4.0 - 5.6 %   HbA1c POC (<> result,  manual entry)     HbA1c, POC (prediabetic range)     HbA1c, POC (controlled diabetic range)        The 10-year ASCVD risk score (Arnett DK, et al., 2019) is: 18.1%    Assessment & Plan:  Type 2 diabetes mellitus with hyperglycemia, without long-term current use of insulin (HCC) -     POCT glycosylated hemoglobin (Hb A1C) -     Tirzepatide ; Inject 5 mg into the skin once a week.  Dispense: 6 mL; Refill: 1 -     Hepatic function panel; Future -     Lipid panel; Future   Assessment and Plan    Type 2 diabetes mellitus with hyperglycemia Type 2 diabetes mellitus with hyperglycemia, well-controlled with current treatment regimen. Significant improvement in A1c, now less than 7. Current regimen includes Mounjaro  and metformin . Increasing Mounjaro  to  5 mg is expected to aid in further weight reduction, improving overall glycemic control and other health parameters. Weight loss is anticipated to alleviate lymphedema symptoms by reducing pressure on the lymphatic and venous systems. - Increase Mounjaro  to 5 mg and send prescription to [facility] for a 90-day supply with a refill. - Schedule follow-up in 4 months to assess response to increased Mounjaro  dose. - Perform annual diabetic foot exam today. - Ensure annual diabetic eye exam is scheduled.  Lymphedema of lower extremity Lymphedema of the lower extremity, likely exacerbated by excess weight. Weight loss is anticipated to alleviate symptoms by reducing pressure on the lymphatic and venous systems. - Encourage continued weight loss to improve lymphedema symptoms.  Hyperlipidemia Hyperlipidemia, currently managed with statins. Need to recheck cholesterol levels and liver function tests due to recent initiation of statin therapy. - Order liver function tests and cholesterol panel to monitor response to statin therapy.        Return in about 4 months (around 02/29/2024) for DM.    Heron CHRISTELLA Sharper, MD

## 2023-11-02 ENCOUNTER — Ambulatory Visit: Payer: Self-pay | Admitting: Family Medicine

## 2024-01-19 ENCOUNTER — Other Ambulatory Visit: Payer: Self-pay | Admitting: Family Medicine

## 2024-01-19 ENCOUNTER — Telehealth: Payer: Self-pay

## 2024-01-19 DIAGNOSIS — E1165 Type 2 diabetes mellitus with hyperglycemia: Secondary | ICD-10-CM

## 2024-01-19 MED ORDER — TIRZEPATIDE 7.5 MG/0.5ML ~~LOC~~ SOAJ: 7.5000 mg | SUBCUTANEOUS | 0 refills | Status: AC

## 2024-01-19 NOTE — Telephone Encounter (Signed)
 Patient informed of the message below.

## 2024-01-19 NOTE — Telephone Encounter (Signed)
 Copied from CRM [id number]. Topic: Clinical - Medication Question >> Jan 19, 2024 10:03 AM Drema MATSU wrote: Reason for CRM: Patient states that he is out of tirzepatide  (MOUNJARO ) 5 MG/0.5ML Pen and he wants to know if the dosage needs to be increased since it has been 3 months. He states that this [facility].

## 2024-01-19 NOTE — Telephone Encounter (Signed)
 Script for 7.5 mg sent [facility]

## 2024-01-21 ENCOUNTER — Telehealth: Payer: Self-pay | Admitting: *Deleted

## 2024-01-21 DIAGNOSIS — E1165 Type 2 diabetes mellitus with hyperglycemia: Secondary | ICD-10-CM

## 2024-01-21 MED ORDER — METFORMIN HCL 1000 MG PO TABS: ORAL_TABLET | ORAL | 1 refills | Status: AC

## 2024-01-21 NOTE — Telephone Encounter (Signed)
 Rx done

## 2024-01-28 ENCOUNTER — Ambulatory Visit: Payer: Self-pay

## 2024-01-28 NOTE — Telephone Encounter (Signed)
 FYI Only or Action Required?: FYI only for provider: appointment scheduled on 01/29/24.  Patient was last seen in primary care on 10/29/2023 by Ozell Heron HERO, MD.  Called Nurse Triage reporting Nasal Congestion.  Symptoms began yesterday.  Interventions attempted: Nothing.  Symptoms are: unchanged.  Triage Disposition: Call PCP Within 24 Hours  Patient/caregiver understands and will follow disposition?: Yes    Message from Antwanette L sent at 01/28/2024  1:41 PM EST  Reason for Triage: Patient reports recent exposure to family members who tested positive for the flu. He is experiencing sneezing, fatigue, slight headache, runny eyes, and congestion. Patient is requesting Tamiflu. The patient can be reached at [phone].   Reason for Disposition  [1] Patient is NOT HIGH RISK AND [2] strongly requests antiviral medicine AND [3] flu symptoms present < 48 hours  Answer Assessment - Initial Assessment Questions .  Answer Assessment - Initial Assessment Questions Scheduled 01/29/24  Advised call back or ED/911 if symptoms occur/worsen: severe diff breathing, chest pain > 5 min, faint. Patient verbalized understanding.   Advised wear mask, hand hygiene, encouraged fluids/hydration. 1. LOCATION: Where does it hurt?      Slight HA, congestion; nasal, runny nose, sneezing Entire family tested pos  FLU A  2. ONSET: When did the sinus pain start?  (e.g., hours, days)      yesterday 3. SEVERITY: How bad is the pain?   (Scale 0-10; or none, mild, moderate or severe)     HA; mild 4. RECURRENT SYMPTOM: Have you ever had sinus problems before? If Yes, ask: When was the last time? and What happened that time?      no 5. NASAL CONGESTION: Is the nose blocked? If Yes, ask: Can you open it or must you breathe through your mouth?     nasal 6. NASAL DISCHARGE: Do you have discharge from your nose? If so ask, What color?     Yellow green 7. FEVER: Do you have a fever?  If Yes, ask: What is it, how was it measured, and when did it start?      Denies fever chills n/v 8. OTHER SYMPTOMS: Do you have any other symptoms? (e.g., sore throat, cough, earache, difficulty breathing) Denies diff breath, chest pain, faint, dizziness, sore throat  Protocols used: Sinus Pain or Congestion-A-AH, Influenza (Flu) Suspected-A-AH

## 2024-01-29 ENCOUNTER — Ambulatory Visit: Admitting: Family Medicine

## 2024-01-29 ENCOUNTER — Encounter: Payer: Self-pay | Admitting: Family Medicine

## 2024-01-29 VITALS — BP 130/70 | HR 79 | Temp 98.6°F | Ht 72.25 in | Wt 289.0 lb

## 2024-01-29 DIAGNOSIS — I890 Lymphedema, not elsewhere classified: Secondary | ICD-10-CM | POA: Diagnosis not present

## 2024-01-29 DIAGNOSIS — E1165 Type 2 diabetes mellitus with hyperglycemia: Secondary | ICD-10-CM

## 2024-01-29 DIAGNOSIS — Z7985 Long-term (current) use of injectable non-insulin antidiabetic drugs: Secondary | ICD-10-CM | POA: Diagnosis not present

## 2024-01-29 DIAGNOSIS — Z20828 Contact with and (suspected) exposure to other viral communicable diseases: Secondary | ICD-10-CM

## 2024-01-29 DIAGNOSIS — R0981 Nasal congestion: Secondary | ICD-10-CM

## 2024-01-29 DIAGNOSIS — R519 Headache, unspecified: Secondary | ICD-10-CM

## 2024-01-29 LAB — POCT INFLUENZA A/B
Influenza A, POC: NEGATIVE
Influenza B, POC: NEGATIVE

## 2024-01-29 LAB — POC COVID19 BINAXNOW: SARS Coronavirus 2 Ag: NEGATIVE

## 2024-01-29 MED ORDER — OSELTAMIVIR PHOSPHATE 75 MG PO CAPS: 75.0000 mg | ORAL_CAPSULE | Freq: Every day | ORAL | 0 refills | Status: AC

## 2024-01-29 MED ORDER — FUROSEMIDE 80 MG PO TABS: 40.0000 mg | ORAL_TABLET | Freq: Two times a day (BID) | ORAL | 1 refills | Status: AC

## 2024-01-29 MED ORDER — ATORVASTATIN CALCIUM 20 MG PO TABS: 20.0000 mg | ORAL_TABLET | Freq: Every day | ORAL | 1 refills | Status: AC

## 2024-01-29 NOTE — Progress Notes (Signed)
 "  Established Patient Office Visit  Subjective   Patient ID: Benjamin Mcdowell, male    DOB: 20-Mar-1965  Age: 59 y.o. MRN: [id number]  Chief Complaint  Patient presents with   Exposure to Influenza A from his daughter   Sinus Problem    Patient complains of nasal congestion, watery eyes and mild headache x2 days    Sinus Problem   Discussed the use of AI scribe software for clinical note transcription with the patient, who gave verbal consent to proceed.  History of Present Illness   Benjamin Mcdowell is a 59 year old male who presents for evaluation of potential flu exposure and medication refills.  He had close household exposure to Influenza A from his wife and two daughters, whose infections were confirmed by testing.  He has mild sneezing, rhinorrhea, and watery eyes without fever, chills, myalgias, or sore throat. He is using Advil  and Sudafed every four hours. He is awaiting his flu test result.  He reports his pharmacy will not refill metformin  through insurance until the 28th, and he has about two weeks remaining. He is also due for refills of his diuretic and Lipitor. He uses Mounjaro  for diabetes management.       Current Outpatient Medications  Medication Instructions   atorvastatin  (LIPITOR) 20 mg, Oral, Daily   COVID-19 At Home Antigen Test KIT as directed   furosemide  (LASIX ) 40 mg, Oral, 2 times daily, Take 1/2 tablet  in the morning, then 1/2 tablet around 2 pm   metFORMIN  (GLUCOPHAGE ) 1000 MG tablet TAKE 1 TABLET (1,000 MG TOTAL) BY MOUTH TWICE A DAY WITH FOOD   oseltamivir (TAMIFLU) 75 mg, Oral, Daily   tirzepatide  (MOUNJARO ) 7.5 mg, Subcutaneous, Weekly    Patient Active Problem List   Diagnosis Date Noted   OSA (obstructive sleep apnea) 01/29/2023   Type 2 diabetes mellitus with hyperglycemia, without long-term current use of insulin (HCC) 01/17/2022   Hx of adenomatous polyp of colon 10/06/2018   Elevated bilirubin 05/02/2016   Hyperglycemia     Obesity, Class II, BMI 35-39.9, isolated 12/21/2014   Cellulitis of right leg 12/21/2014   Chronic acquired lymphedema 01/06/2013   High triglycerides 01/06/2013     Review of Systems  All other systems reviewed and are negative.     Objective:     BP 130/70   Pulse 79   Temp 98.6 F (37 C) (Oral)   Ht 6' 0.25 (1.835 m)   Wt 289 lb (131.1 kg)   SpO2 97%   BMI 38.92 kg/m    Physical Exam Vitals reviewed.  Constitutional:      Appearance: Normal appearance. He is obese.  HENT:     Right Ear: Tympanic membrane normal.     Left Ear: Tympanic membrane normal.     Nose: Congestion present.     Mouth/Throat:     Mouth: Mucous membranes are moist.     Pharynx: Posterior oropharyngeal erythema present.  Eyes:     Conjunctiva/sclera: Conjunctivae normal.  Cardiovascular:     Rate and Rhythm: Normal rate and regular rhythm.     Heart sounds: Normal heart sounds. No murmur heard. Pulmonary:     Effort: Pulmonary effort is normal.     Breath sounds: Normal breath sounds. No wheezing, rhonchi or rales.  Lymphadenopathy:     Cervical: No cervical adenopathy.  Neurological:     Mental Status: He is alert.      Results for orders placed or performed in visit  on 01/29/24  POC COVID-19  Result Value Ref Range   SARS Coronavirus 2 Ag Negative Negative  POC Influenza A/B  Result Value Ref Range   Influenza A, POC Negative Negative   Influenza B, POC Negative Negative      The ASCVD Risk score (Arnett DK, et al., 2019) failed to calculate for the following reasons:   The valid total cholesterol range is 130 to 320 mg/dL    Assessment & Plan:  Exposure to influenza -     POCT Influenza A/B -     Oseltamivir Phosphate; Take 1 capsule (75 mg total) by mouth daily.  Dispense: 10 capsule; Refill: 0  Nonintractable headache, unspecified chronicity pattern, unspecified headache type -     POC COVID-19 BinaxNow -     POCT Influenza A/B  Nasal congestion -     POC  COVID-19 BinaxNow -     POCT Influenza A/B  Type 2 diabetes mellitus with hyperglycemia, without long-term current use of insulin (HCC) -     Atorvastatin  Calcium ; Take 1 tablet (20 mg total) by mouth daily.  Dispense: 90 tablet; Refill: 1  Chronic acquired lymphedema -     Furosemide ; Take 0.5 tablets (40 mg total) by mouth 2 (two) times daily. Take 1/2 tablet  in the morning, then 1/2 tablet around 2 pm  Dispense: 90 tablet; Refill: 1   Assessment and Plan    Influenza exposure, prophylactic management Exposure to influenza A with household contacts confirmed positive. Currently asymptomatic with mild symptoms such as sneezing, runny nose, and watery eyes. No fever, chills, or body aches. Throat appears red, possibly due to drainage. Prophylactic management with Tamiflu is indicated due to household exposure. - Prescribed Tamiflu prophylactically at one capsule daily for ten days. - If flu test returns positive, adjust Tamiflu dosage to one capsule twice daily for five days.  Type 2 diabetes mellitus with hyperglycemia Experiencing issues with metformin  refill due to insurance restrictions. Currently on Mounjaro  for diabetes management. Upcoming appointment scheduled for February for diabetes management and blood work. - Coordinated with pharmacy to resolve metformin  refill issue. - Continue Mounjaro  for diabetes management. - Scheduled follow-up appointment in February for diabetes management and blood work.        No follow-ups on file.    Heron CHRISTELLA Sharper, MD "

## 2024-01-29 NOTE — Telephone Encounter (Signed)
 Seen today, ok to close

## 2024-02-29 ENCOUNTER — Ambulatory Visit: Admitting: Family Medicine
